# Patient Record
Sex: Male | Born: 1940 | Race: White | Hispanic: No | Marital: Married | Smoking: Former smoker
Health system: Southern US, Community
[De-identification: ages and names within clinical notes are randomized; demographics above are authoritative.]

## PROBLEM LIST (undated history)

## (undated) DIAGNOSIS — I739 Peripheral vascular disease, unspecified: Secondary | ICD-10-CM

## (undated) DIAGNOSIS — I10 Essential (primary) hypertension: Secondary | ICD-10-CM

## (undated) DIAGNOSIS — I255 Ischemic cardiomyopathy: Secondary | ICD-10-CM

## (undated) DIAGNOSIS — I509 Heart failure, unspecified: Secondary | ICD-10-CM

## (undated) DIAGNOSIS — K219 Gastro-esophageal reflux disease without esophagitis: Secondary | ICD-10-CM

## (undated) DIAGNOSIS — E785 Hyperlipidemia, unspecified: Secondary | ICD-10-CM

## (undated) DIAGNOSIS — I2510 Atherosclerotic heart disease of native coronary artery without angina pectoris: Secondary | ICD-10-CM

## (undated) DIAGNOSIS — H9190 Unspecified hearing loss, unspecified ear: Secondary | ICD-10-CM

## (undated) DIAGNOSIS — I219 Acute myocardial infarction, unspecified: Secondary | ICD-10-CM

## (undated) DIAGNOSIS — D696 Thrombocytopenia, unspecified: Secondary | ICD-10-CM

## (undated) HISTORY — DX: Unspecified hearing loss, unspecified ear: H91.90

## (undated) HISTORY — DX: Essential (primary) hypertension: I10

## (undated) HISTORY — PX: COLONOSCOPY: SHX174

## (undated) HISTORY — DX: Atherosclerotic heart disease of native coronary artery without angina pectoris: I25.10

## (undated) HISTORY — PX: HERNIA REPAIR: SHX51

## (undated) HISTORY — DX: Peripheral vascular disease, unspecified: I73.9

## (undated) HISTORY — DX: Ischemic cardiomyopathy: I25.5

## (undated) HISTORY — DX: Hyperlipidemia, unspecified: E78.5

## (undated) HISTORY — DX: Acute myocardial infarction, unspecified: I21.9

---

## 1992-09-23 ENCOUNTER — Encounter: Payer: Self-pay | Admitting: Cardiovascular Disease

## 1994-09-27 DIAGNOSIS — I219 Acute myocardial infarction, unspecified: Secondary | ICD-10-CM

## 1994-09-27 HISTORY — DX: Acute myocardial infarction, unspecified: I21.9

## 1994-09-27 HISTORY — PX: CORONARY ARTERY BYPASS GRAFT: SHX141

## 2008-05-29 ENCOUNTER — Ambulatory Visit: Payer: Self-pay | Admitting: Vascular Surgery

## 2009-10-16 DIAGNOSIS — I2510 Atherosclerotic heart disease of native coronary artery without angina pectoris: Secondary | ICD-10-CM | POA: Insufficient documentation

## 2009-10-16 DIAGNOSIS — E119 Type 2 diabetes mellitus without complications: Secondary | ICD-10-CM | POA: Insufficient documentation

## 2009-10-16 DIAGNOSIS — I10 Essential (primary) hypertension: Secondary | ICD-10-CM | POA: Insufficient documentation

## 2009-10-16 DIAGNOSIS — I749 Embolism and thrombosis of unspecified artery: Secondary | ICD-10-CM | POA: Insufficient documentation

## 2009-10-17 ENCOUNTER — Ambulatory Visit: Payer: Self-pay | Admitting: Cardiovascular Disease

## 2009-10-17 DIAGNOSIS — I739 Peripheral vascular disease, unspecified: Secondary | ICD-10-CM | POA: Insufficient documentation

## 2009-10-17 DIAGNOSIS — R011 Cardiac murmur, unspecified: Secondary | ICD-10-CM | POA: Insufficient documentation

## 2009-10-17 DIAGNOSIS — R0602 Shortness of breath: Secondary | ICD-10-CM | POA: Insufficient documentation

## 2009-11-03 ENCOUNTER — Telehealth (INDEPENDENT_AMBULATORY_CARE_PROVIDER_SITE_OTHER): Payer: Self-pay

## 2009-11-04 ENCOUNTER — Encounter: Payer: Self-pay | Admitting: Internal Medicine

## 2009-11-04 ENCOUNTER — Ambulatory Visit: Payer: Self-pay

## 2009-11-04 ENCOUNTER — Ambulatory Visit (HOSPITAL_COMMUNITY): Admission: RE | Admit: 2009-11-04 | Discharge: 2009-11-04 | Payer: Self-pay | Admitting: Internal Medicine

## 2009-11-04 ENCOUNTER — Encounter (HOSPITAL_COMMUNITY): Admission: RE | Admit: 2009-11-04 | Discharge: 2009-12-31 | Payer: Self-pay | Admitting: Cardiovascular Disease

## 2009-11-04 ENCOUNTER — Ambulatory Visit: Payer: Self-pay | Admitting: Cardiology

## 2009-11-14 ENCOUNTER — Ambulatory Visit: Payer: Self-pay | Admitting: Cardiovascular Disease

## 2009-12-01 ENCOUNTER — Ambulatory Visit: Payer: Self-pay | Admitting: Internal Medicine

## 2010-10-08 ENCOUNTER — Ambulatory Visit
Admission: RE | Admit: 2010-10-08 | Discharge: 2010-10-08 | Payer: Self-pay | Source: Home / Self Care | Attending: Vascular Surgery | Admitting: Vascular Surgery

## 2010-10-08 ENCOUNTER — Ambulatory Visit: Admit: 2010-10-08 | Payer: Self-pay | Admitting: Vascular Surgery

## 2010-10-28 NOTE — Assessment & Plan Note (Signed)
Summary: ok per jamie/to discuss icd placement/saf   Visit Type:  Initial Consult Primary Provider:  Dr. Laurene Footman   History of Present Illness: Troy [name] is referred today by Dr. Eden Emms for consideration for prophylactic ICD implant.  The patient is a pleasant 70 yo man with a h/o MI/PCI/CABG in 1994.  He has done quite well though he was lost to followup.  He has had a stress myoview which demonstrated a large antero scar and an EF34%. The patient also had a 2D echo which demonstrated an EF described as 35-40%.  He has class 1 CHF.  He exercises 4-5 days per week.  He has never had syncope and has had no hospitalizations for CHF. No peripheral edema.  Current Medications (verified): 1)  Simvastatin 20 Mg Tabs (Simvastatin) .... Take One Tablet By Mouth Daily At Bedtime 2)  Actoplus Met 15-850 Mg Tabs (Pioglitazone Hcl-Metformin Hcl) .... Take 1 Tablet By Mouth Two Times A Day 3)  Aspirin 81 Mg Tbec (Aspirin) .... Take One Tablet By Mouth Daily 4)  Oyster Shell Calcium 500 Mg Tabs (Oyster Shell) .... Take 1 Tablet By Mouth Once A Day 5)  Fish Oil 1000 Mg Caps (Omega-3 Fatty Acids) .... Take 1 Capsule By Mouth Two Times A Day 6)  Vitamin C 1000 Mg Tabs (Ascorbic Acid) .... Take 1 Tablet By Mouth Once A Day 7)  Glimepiride 2 Mg Tabs (Glimepiride) .... Take 1 Tablet By Mouth Once A Day 8)  Amlodipine Besy-Benazepril Hcl 10-20 Mg Caps (Amlodipine Besy-Benazepril Hcl) .... Take One Tablet By Mouth Once Daily.  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   Family History: Last updated: 10/16/2009 Unremarkable.      Social History: Last updated: 10/16/2009  He is married and has four children.  He is a retired   [employer].  Smoking history as above.  He does not consume alcohol  Review of Systems       All systems reviewed and negative except as noted in the HPI.  Vital Signs:  Patient profile:   70 year old male Height:      69 inches Weight:      222 pounds BMI:     32.90 O2 Sat:      96 % Pulse rate:   78 / minute BP sitting:   150 / 80  (left arm)  Vitals Entered By: Laurance Flatten CMA (December 01, 2009 4:20 PM)  Physical Exam  General:  Affect appropriate Healthy:  appears stated age HEENT: normal Neck supple with no adenopathy JVP normal no bruits no thyromegaly Lungs clear with no wheezing and good diaphragmatic motion Heart:  S1/S2 systolic  murmur,rub, gallop or click PMI enlarged Abdomen: benighn, BS positve, no tenderness, no AAA no bruit.  No HSM or HJR Distal pulses intact with no bruits No edema Neuro non-focal Skin warm and dry    Impression & Recommendations:  Problem # 1:  CAD (ICD-414.00) The patient has a borderline indication for ICD.  His EF is on the borderline and he really has class 1 symptoms.  I discussed the treatment options with the patient.  He is not inclined to proceed with ICD implant at this time.  He was unable  to tolerate an ARB. I will start him on low dose carvedilol. His updated medication list for this problem includes:    Aspirin 81 Mg Tbec (Aspirin) .Marland Kitchen... Take one tablet by mouth daily    Amlodipine Besy-benazepril Hcl 10-20 Mg Caps (Amlodipine besy-benazepril hcl) .Marland Kitchen... Take one tablet by mouth once daily.    Carvedilol 3.125 Mg Tabs (Carvedilol) .Marland Kitchen... Take one by mouth twice daily  Problem # 2:  HYPERTENSION (ICD-401.9) His blood pressure is elevated today and I would anticipate uptitrating his carvedilol as Dr. Eden Emms sees him in followup. A low sodium diet is recommended. He has self restarted his Amlodipine Benazapril. The following medications were removed from the medication  list:    Losartan Potassium 100 Mg Tabs (Losartan potassium) .Marland Kitchen... Take 1 tablet by mouth once a day His updated medication list for this problem includes:    Aspirin 81 Mg Tbec (Aspirin) .Marland Kitchen... Take one tablet by mouth daily    Amlodipine Besy-benazepril Hcl 10-20 Mg Caps (Amlodipine besy-benazepril hcl) .Marland Kitchen... Take one tablet by mouth once daily.    Carvedilol 3.125 Mg Tabs (Carvedilol) .Marland Kitchen... Take one by mouth twice daily  Patient Instructions: 1)  Your physician recommends that you schedule a follow-up appointment --keep apt with Dr Eden Emms as scheduled 2)  Your physician has recommended you make the following change in your medication: start Carvedilol 3.125mg  two times a day Prescriptions: CARVEDILOL 3.125 MG TABS (CARVEDILOL) take one by mouth twice daily  #60 x 6   Entered by:   Dennis Bast, RN, BSN   Authorized by:   Laren Boom, MD, [facility]   Signed by:   Dennis Bast, RN, BSN on 12/01/2009   Method used:   Electronically to        [facility]  [address] (retail)       [address]       [place], [place]  [address]       Ph: [phone]       Fax: [phone]   RxID:   [id number]

## 2010-10-28 NOTE — Assessment & Plan Note (Signed)
Summary: rov/discuss myoview/dm   Primary Provider:  Dr. Laurene Footman  CC:  follow up stress test.  History of Present Illness: Troy [name] is seen today for F/U of his echo and nucllear study.  He is a previous patient of Dr Riley Kill who was lost to F/U.  He had CABG about 15 years ago.  He is active with no SSCP but some dyspnea.  His myovue should a very large anteroapical MI with no ischemia and an EF of 34%.  I had a long discussion with him and his wife regarding this.  He needs to be on an ACE or ARB and stop his Ca blocker.  His wife had a bad cough with lisinopril so we will start a generic ARB.  He really doesnt want to be cathed and since he is active and essentially asymptomatic I dont think we need to rush into this.  He needs to be evaluated by EP for AICD.  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  He will have a BMET and BNP checked in a few weeks at Dr Lyndle Herrlich office.  We will see about getting him on coreg once we know he tolerates an ARB.    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 Take One Tablet By Mouth Daily At Bedtime 2)  Actoplus Met 15-850 Mg Tabs (Pioglitazone Hcl-Metformin Hcl) .... Take 1 Tablet By Mouth Two Times A Day 3)  Aspirin 81 Mg Tbec (Aspirin) .... Take One Tablet By Mouth Daily 4)  Oyster Shell Calcium 500 Mg Tabs (Oyster Shell) .... Take 1 Tablet By Mouth Once A Day 5)  Fish Oil 1000 Mg Caps (Omega-3 Fatty Acids) .... Take 1 Capsule By Mouth Two Times A Day 6)  Vitamin C 1000 Mg Tabs (Ascorbic Acid) .... Take 1 Tablet By Mouth Once A Day 7)  Glimepiride 2 Mg Tabs (Glimepiride) .... Take 1 Tablet By Mouth Once A Day 8)  Losartan  Potassium 100 Mg Tabs (Losartan Potassium) .... 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   Family History: Last updated: 10/16/2009 Unremarkable.      Social History: Last updated: 10/16/2009  He is married and has four children.  He is a retired   [employer].  Smoking history as above.  He does not consume alcohol  Review of Systems       Denies fever, malais, weight loss, blurry vision, decreased visual acuity, cough, sputum, , hemoptysis, pleuritic pain, palpitaitons, heartburn, abdominal pain, melena, lower extremity edema, claudication, or rash. All other systems reviewed and negative  Vital Signs:  Patient profile:   70 year old male Height:      69 inches Weight:      225 pounds BMI:     33.35 Pulse rate:   79 / minute Resp:     14 per minute BP sitting:   117 / 69  (  right arm)  Vitals Entered By: Kem Parkinson (November 14, 2009 1:41 PM)  Physical Exam  General:  Affect appropriate Healthy:  appears stated age HEENT: normal Neck supple with no adenopathy JVP normal no bruits no thyromegaly Lungs clear with no wheezing and good diaphragmatic motion Heart:  S1/S2 systolic  murmur,rub, gallop or click PMI enlarged Abdomen: benighn, BS positve, no tenderness, no AAA no bruit.  No HSM or HJR Distal pulses intact with no bruits No edema Neuro non-focal Skin warm and dry    Impression & Recommendations:  Problem # 1:  CAD (ICD-414.00) Ischemic DCM Functional class 2.  Start ARB.  F/U EPS to discuss AICD.  F/IU BMET and BNP in a few weeks and then decide on further changes to medical Rx.   D/C calcium blocker The following medications were removed from the medication list:    Amlodipine Besy-benazepril Hcl 10-20 Mg Caps (Amlodipine besy-benazepril hcl) .Marland Kitchen... Take 1 capsule by mouth once a day His updated medication list for this problem includes:    Aspirin 81 Mg Tbec (Aspirin) .Marland Kitchen... 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 Take 1 capsule by mouth once a day His updated medication list for this problem includes:    Losartan Potassium 100 Mg Tabs (Losartan potassium) .Marland Kitchen... Take 1 tablet by mouth once a day  Problem # 3:  HYPERTENSION (ICD-401.9) Add ARB and will see were he runs.  Low sodium diet The following medications were removed from the medication list:    Amlodipine Besy-benazepril Hcl 10-20 Mg Caps (Amlodipine besy-benazepril hcl) .Marland Kitchen... Take 1 capsule by mouth once a day His updated medication list for this problem includes:    Aspirin 81 Mg Tbec (Aspirin) .Marland Kitchen... Take one tablet by mouth daily    Losartan Potassium 100 Mg Tabs (Losartan potassium) .Marland Kitchen... Take 1 tablet by mouth once a day  Other Orders: EP Referral (Cardiology EP Ref )  Patient Instructions: 1)  Stop Amlodipine 2)  Start Losartan 100mg  daily 3)  Labs with primary care next week 4)  You have been referred to Dr Ladona Ridgel in 3-4 weeks 5)  Follow up in 3 months Prescriptions: LOSARTAN POTASSIUM 100 MG TABS (LOSARTAN POTASSIUM) Take 1 tablet by mouth once a day  #30 x 6   Entered by:   Meredith Staggers, RN   Authorized by:   Colon Branch, MD, [facility]   Signed by:   Meredith Staggers, RN on 11/14/2009   Method used:   Electronically to        [facility]  [address] (retail)       [address]       [place], [place]  [address]       Ph: [phone]       Fax:  [phone]   RxID:   [id number]

## 2010-10-28 NOTE — Assessment & Plan Note (Signed)
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 Hx. MI.  Symptoms: DOE    Nuclear Pre-Procedure Cardiac Risk Factors: Claudication, History of Smoking, Hypertension, Lipids, PVD Caffeine/Decaff Intake: None NPO After: 8:00 PM Lungs: clear IV 0.9% NS with Angio Cath: 20g     IV Site: (R) AC IV Started by: Irean Hong RN Chest Size (in) 44     Height (in): 69 Weight (lb): 221 BMI: 32.75 Tech Comments: Dr. Eden Emms consulted about this patient. The patient had an excessive amount of ventricular ectopy while walking the treadmill, cuplets, and triplets. The pictures are thought to have possible perinfarct ischemia, with and EFof 34%. Dr. Myrtis Ser will read the study later with the final conclusion. Dr.Nishan wanted the patient set up with him on Febraury 22, 2011. Also, a consult with S.Klein for possible Defibrilator implantation.  Nuclear Med Study 1 or 2 day study:  1 day     Stress Test Type:  Stress Reading MD:  Willa Rough, MD     Referring MD:  P.Nishan Resting Radionuclide:  Technetium 1m Tetrofosmin     Resting Radionuclide Dose:  11.0 mCi  Stress Radionuclide:  Technetium 102m Tetrofosmin     Stress Radionuclide Dose:  33.0 mCi   Stress Protocol Exercise Time (min):  6:46 min     Max HR:  142 bpm     Predicted Max HR:  152 bpm  Max Systolic BP: 182 mm Hg     Percent Max HR:  93.42 %     METS: 7.2 Rate Pressure Product:  45409    Stress Test Technologist:  Milana Na EMT-P     Nuclear Technologist:  Harlow Asa CNMT  Rest Procedure  Myocardial perfusion imaging was performed at rest 45 minutes following the intravenous administration of Myoview Technetium 5m Tetrofosmin.  Stress Procedure  The patient exercised for 6:46. The patient  stopped due to fatigue and denied any chest pain.  There were non specific ST-T wave changes, with freq pvcs; cuplets, and triplets.  Myoview was injected at peak exercise and myocardial perfusion imaging was performed after a brief delay.  QPS Raw Data Images:  Normal; no motion artifact; normal heart/lung ratio. Stress Images:  Marked decrease in activity in the anterior wall and apex Rest Images:  Same as stress Subtraction (SDS):  Slight ischemia Transient Ischemic Dilatation:  1.01  (Normal <1.22)  Lung/Heart Ratio:  .41  (Normal <0.45)  Quantitative Gated Spect Images QGS EDV:  223 ml QGS ESV:  148 ml QGS EF:  34 % QGS cine images:  Dyskinesis of the entire distal LV and apex.   Overall Impression  Exercise Capacity: Fair exercise capacity. BP Response: Normal blood pressure response. Clinical Symptoms: No chest pain ECG Impression: Ventricular ectopy Overall Impression: There is very large anterior and apical scar. There is mild peri-infarct ischemia.  Appended Document: Cardiology Nuclear Study SEE OFFICE NOTE

## 2010-10-28 NOTE — Assessment & Plan Note (Signed)
Summary: NP3/CAD   Visit Type:  Initial Consult  CC:  CAD.  History of Present Illness: Troy [name] is a previous patient of Dr Riley Kill in the early 90's.  He is referred by Dr Darrick Penna and [place] for cardiac F/U.  He had an MI in the mid 90's and subsequent CABG by [facility].  He has not really had F/U since then.  He sees Dr Darrick Penna for claudicatoin with ABI's only mildly decreased in the .8 range.  He has some exertional dyspnea.  I don't knwo hiis EF.  He complains of hip pain with ambulation which sounds more orthopedic than claudicant.  He is retired for the last 5 years but is raising his 45 year old grandson as his daughter who lives with him has drug and alcohol problems.  He denies SSCP, palpitations, edema or syncope.  He is compliant with his meds and Dr Evlyn Kanner follows his labs.    Cardiovascular Risk History:      Positive major cardiovascular risk factors include male age 67 years old or older, diabetes, and hypertension.        Positive history for target organ damage include ASHD (either angina; prior MI; prior CABG) and peripheral vascular disease.    Current Problems (verified): 1)  Dyspnea  (ICD-786.05) 2)  Arterial Occlusive Disease  (ICD-444.9) 3)  Cad  (ICD-414.00) 4)  Hypertension  (ICD-401.9) 5)  Dm  (ICD-250.00)  Current Medications (verified): 1)  Simvastatin 20 Mg Tabs (Simvastatin) .... Take One Tablet By Mouth Daily At Bedtime 2)  Amlodipine Besy-Benazepril Hcl 10-20 Mg Caps (Amlodipine Besy-Benazepril Hcl) .... Take 1 Capsule By Mouth Once A Day 3)  Actoplus Met 15-850 Mg Tabs (Pioglitazone Hcl-Metformin Hcl) .... Take 1 Tablet By Mouth Two Times A Day 4)  Aspirin 81 Mg Tbec (Aspirin) .... Take One Tablet By Mouth Daily 5)  Oyster Shell Calcium 500 Mg Tabs (Oyster Shell) .... Take 1 Tablet By Mouth Once A Day 6)  Fish Oil 1000 Mg Caps (Omega-3 Fatty Acids) .... Take 1 Capsule By Mouth Two Times A Day 7)  Vitamin C 1000 Mg Tabs (Ascorbic Acid) .... Take 1 Tablet By Mouth  Once A Day 8)  Glimepiride 2 Mg Tabs (Glimepiride) .... Take 1 Tablet By Mouth Once A Day  Allergies (verified): No Known Drug Allergies  Review of Systems       Denies fever, malais, weight loss, blurry vision, decreased visual acuity, cough, sputum, , hemoptysis, pleuritic pain, palpitaitons, heartburn, abdominal pain, melena, lower extremity edema, claudication, or rash.   Vital Signs:  Patient profile:   70 year old male Height:      69 inches Weight:      222.50 pounds BMI:     32.98 Pulse rate:   60 / minute Pulse rhythm:   irregular Resp:     18 per minute BP sitting:   130 / 74  (left arm) Cuff size:   large  Vitals Entered By: Vikki Ports (October 17, 2009 9:08 AM)  Physical Exam  General:  Affect appropriate Healthy:  appears stated age HEENT: normal Neck supple with no adenopathy JVP normal no bruits no thyromegaly Lungs clear with no wheezing and good diaphragmat 2SEM  murmur, no rub, gallop or click PMI normal Abdomen: benighn, BS positve, no tenderness, no AAA no bruit.  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  Also evaluate EF given abnormal ECG, previous MI and CABG His updated medication list for this problem includes:    Amlodipine Besy-benazepril Hcl 10-20 Mg Caps (Amlodipine besy-benazepril hcl) .Marland Kitchen... Take 1 capsule by mouth once a day  Problem # 2:  CAD (ICD-414.00) CABG over 15 years ago with no F/U.  Exertional dyspnea  Myovue His updated medication list for this problem includes:    Amlodipine Besy-benazepril Hcl 10-20 Mg Caps (Amlodipine besy-benazepril hcl) .Marland Kitchen... Take 1 capsule by mouth once a day    Aspirin 81 Mg Tbec (Aspirin) .Marland Kitchen... Take one tablet by mouth daily  Orders: EKG w/ Interpretation (93[address]) Nuclear Stress Test (Nuc Stress Test) Echocardiogram (Echo)  Problem # 3:   HYPERTENSION (ICD-401.9) Well controlled His updated medication list for this problem includes:    Amlodipine Besy-benazepril Hcl 10-20 Mg Caps (Amlodipine besy-benazepril hcl) .Marland Kitchen... Take 1 capsule by mouth once a day    Aspirin 81 Mg Tbec (Aspirin) .Marland Kitchen... Take one tablet by mouth daily  Orders: EKG w/ Interpretation ([address]) Nuclear Stress Test (Nuc Stress Test) Echocardiogram (Echo)  Problem # 4:  PVD (ICD-443.9) F/U Fields  Consider yearly ABI';s with duplex.  Walking limited more by orhopedic hip pain right greater than left at this time  Cardiovascular Risk Assessment/Plan:      The patient's hypertensive risk group is category C: Target organ damage and/or diabetes.  Today's blood pressure is 130/74.    Patient Instructions: 1)  Your physician wants you to follow-up in: 1 year.  You will receive a reminder letter in the mail two months in advance. If you don't receive a letter, please call our office to schedule the follow-up appointment. 2)  Your physician has requested that you have an echocardiogram.  Echocardiography is a painless test that uses sound waves to create images of your heart. It provides your doctor with information about the size and shape of your heart and how well your heart's chambers and valves are working.  This procedure takes approximately one hour. There are no restrictions for this procedure. 3)  Your physician has requested that you have an exercise stress myoview.  For further information please visit [email or web address]  Please follow instruction sheet, as given.     EKG Report  Procedure date:  10/17/2009  Findings:      NSR 60 PR 242 Previous anterior MI anterolateral biphasic T's Abnormal ECG   Appended Document: NP3/CAD F/U EPS to discuss AICD

## 2010-10-28 NOTE — Cardiovascular Report (Signed)
Summary: Heart Services Procedure Report  Heart Services Procedure Report   Imported By: Roderic Ovens 10/23/2009 11:38:36  _____________________________________________________________________  External Attachment:    Type:   Image     Comment:   External Document

## 2010-10-28 NOTE — Progress Notes (Signed)
Summary: Nuc. Pre-Procedure  Phone Note Outgoing Call Call back at [facility] Phone [phone]   Summary of Call: Left message with information on Myoview Information Sheet (see scanned document for details).      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 Hx. MI.  Symptoms: DOE    Nuclear Pre-Procedure Cardiac Risk Factors: Claudication, History of Smoking, Hypertension, Lipids, PVD Height (in): 69

## 2011-02-09 NOTE — Assessment & Plan Note (Signed)
OFFICE VISIT   Troy Troy [name], Troy Troy [name]  DOB:  05-13-1941                                       10/08/2010  EAVWU#:[id number]   CHIEF COMPLAINT:  Right leg pain.   HISTORY OF PRESENT ILLNESS:  The patient is a 70 year old male referred  by Dr. Evlyn Troy [name] for right lower extremity claudication symptoms.  The  patient was doing okay until recently when he took a trip to [employer] in [place] and noticed that after walking long distances he had  pain in his right calf.  The pain would resolve after resting  approximately 2 to 3 minutes.  It had a fairly consistent length.  He  states that the pain has been going on for about 1 mile.  He states the  pain would usually kick in after walking approximately 3 miles.  He  denies any symptoms in the left lower extremity.   CHRONIC MEDICAL PROBLEMS:  Include diabetes, hypertension and coronary  artery disease.  These are all currently stable and followed by Dr.  Evlyn Troy [name].  He has had diabetes for almost 20 years.  He is a former smoker  but quit 30 years ago.   SOCIAL HISTORY:  He is retired.  He is married.  Smoking history is as  listed above.  Does not consume alcohol regularly.   FAMILY HISTORY:  Unremarkable for vascular disease at age less than 65.   REVIEW OF SYSTEMS:  Full 12 point review of systems was performed with  the patient today.  GENERAL:  He is 5 feet 9 inches, 234 pounds.  VASCULAR:  As mentioned above.  All other systems were negative.   MEDICATIONS:  1. Simvastatin 40 mg half tablet once daily.  2. Aspirin 81 mg once a day.  3. Fish Oil once daily.  4. Vitamin C, vitamin Troy [name] once daily.  5. Glimepiride 2 mg 1 tablet daily.  6. Kombiglyze XR once daily.  7. Benazepril 20 mg once a day.  8. Amlodipine 10 mg once a day.   ALLERGIES:  He has no known drug allergies.   PHYSICAL EXAM:  Vital signs:  Blood pressure is 148/71 in the left arm,  oxygen saturation is 98% on room air, heart rate 74 and  regular.  HEENT:  Unremarkable.  Neck:  Has 2+ carotid pulses without bruit.  Chest:  Clear to auscultation.  Cardiac:  Regular rate and rhythm without  murmur.  Abdomen:  Obese, soft, nontender, nondistended.  No masses.  Extremities:  He has 2+ radial, femoral pulses bilaterally.  He has a 2+  left dorsalis pedis pulse.  He has absent pedal pulses in the right  foot.  Neurological:  Shows symmetric upper extremity and lower  extremity motor strength which is 5/5.  Musculoskeletal:  Shows no  obvious major joint deformities.  Skin:  Has no open ulcers or rashes.   He had bilateral ABIs performed today which were 0.9 on the right with  triphasic waveforms and 1.15 on the left with triphasic waveforms.  These were similar to the results of his ABIs performed in September of  2009.   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  He has overall fairly mild right lower  extremity claudication symptoms.  I discussed with him today continued  risk factor  modification with control of his blood pressure, cholesterol  and diabetes.  His symptoms are fairly mild in nature.  I do not believe  that he would benefit significantly from an intervention at this point.  He is fairly satisfied with his walking distance but mainly wanted to  check and make sure things had not deteriorated.  He will follow up with  me in 6 months' time for repeat ABIs or sooner if his symptoms worsen.  He will continue to take his aspirin daily.     Troy Hora. Fields, MD  Electronically Signed   CEF/MEDQ  Troy [name]:  10/08/2010  T:  10/09/2010  Job:  [id number]   cc:   Troy Troy [name], M.Troy [name].

## 2011-02-09 NOTE — Assessment & Plan Note (Signed)
OFFICE VISIT   Troy [name], Troy [name]  DOB:  March 12, 1941                                       05/29/2008  WUJWJ#:[id number]   The patient is a 70 year old male with atherosclerotic risk factors of  diabetes, hypertension, coronary artery disease and elevated  cholesterol.  He is also a former smoker but quit in 1996.  He is sent  today for evaluation by Dr. Evlyn Kanner for lower extremity arterial occlusive  disease.  He currently has some pain in his feet with walking.  However,  he does not experience this until he walks at least a mile.  He  exercises regularly and goes to the swimming pool a few times a week to  work out.  He states that this pain he gets in his feet has been present  for at least 2 years.  He does not complain of significant claudication  symptoms.   PAST SURGICAL HISTORY:  Remarkable for coronary artery bypass grafting  with harvesting of both saphenous veins.   MEDICATIONS:  1. Include glipizide 10 mg once a day.  2. Simvastatin 40 mg once a day.  3. Amlodipine benazepril 10/20 once a day.  4. Avandamet 10/998 twice a day.  5. Levemir insulin 22 units once a day.  6. Fish oil 1000 mg twice a day.  7. Aspirin 325 mg once a day.  8. Vitamin C 500 mg once a day.   ALLERGIES:  He has no known drug allergies.   PAST MEDICAL HISTORY:  Significant for myocardial infarction in 1996.   FAMILY HISTORY:  Unremarkable.   SOCIAL HISTORY:  He is married and has four children.  He is a retired  [employer].  Smoking history as above.  He does not consume alcohol.   REVIEW OF SYSTEMS:  He is 5 feet 9 inches and 238 pounds.  Cardiac,  pulmonary, GI, renal, vascular, neurologic, orthopedic, psychiatric,  ENT, hematologic review of systems are otherwise negative.   PHYSICAL EXAMINATION:  Vital signs:  Blood pressure is 144/77 in the  left arm, pulse is 71 and regular.  HEENT:  Is unremarkable.  Neck:  2+  carotid pulses without bruits.  Chest:  Clear  to auscultation.  Cardiac:  Exam is regular rate and rhythm without murmur.  Abdomen:  Is obese,  soft, nontender, nondistended.  No masses.  He has 2+ carotid, radial,  femoral pulses bilaterally.  He has 1+ popliteal pulses bilaterally.  He  has a 1+ right dorsalis pedis pulse, a 2+ left dorsalis pedis pulse and  2+ posterior tibial pulses bilaterally.   He had bilateral ABIs performed at our office today which were 0.87 on  the right side and 1.22 on the left side.  This is a mild decrease on  the right side.   In summary, the patient has mild arterial occlusive disease in his right  lower extremity.  This is currently essentially asymptomatic.  Lower  extremity arterial circulation on the left side is normal.   I reassured the patient that his risk of limb loss long-term is quite  low.  However, I do believe he warrants followup since he does have a  history of diabetes and could have progression of disease over time.  I  encouraged him to continue his exercise program and also to walk as much  as possible.  In addition to controlling his atherosclerotic risk  factors of elevated cholesterol, hypertension and diabetes.  He will  follow up with me with repeat ABIs in 1 year's time.   Janetta Hora. Fields, MD  Electronically Signed   CEF/MEDQ  D:  05/30/2008  T:  05/30/2008  Job:  [id number]   cc:   [name], M.D.

## 2011-02-12 ENCOUNTER — Encounter: Payer: Self-pay | Admitting: Vascular Surgery

## 2011-04-15 ENCOUNTER — Ambulatory Visit: Payer: Self-pay | Admitting: Vascular Surgery

## 2011-06-03 ENCOUNTER — Encounter: Payer: Self-pay | Admitting: Cardiovascular Disease

## 2011-06-08 ENCOUNTER — Encounter: Payer: Self-pay | Admitting: Cardiovascular Disease

## 2011-06-08 ENCOUNTER — Ambulatory Visit (INDEPENDENT_AMBULATORY_CARE_PROVIDER_SITE_OTHER): Payer: Medicare Other | Admitting: Cardiovascular Disease

## 2011-06-08 VITALS — BP 146/78 | HR 69 | Resp 14 | Wt 227.0 lb

## 2011-06-08 DIAGNOSIS — I2510 Atherosclerotic heart disease of native coronary artery without angina pectoris: Secondary | ICD-10-CM

## 2011-06-08 DIAGNOSIS — I2109 ST elevation (STEMI) myocardial infarction involving other coronary artery of anterior wall: Secondary | ICD-10-CM

## 2011-06-08 DIAGNOSIS — R011 Cardiac murmur, unspecified: Secondary | ICD-10-CM

## 2011-06-08 DIAGNOSIS — I10 Essential (primary) hypertension: Secondary | ICD-10-CM

## 2011-06-08 DIAGNOSIS — I739 Peripheral vascular disease, unspecified: Secondary | ICD-10-CM

## 2011-06-08 DIAGNOSIS — E119 Type 2 diabetes mellitus without complications: Secondary | ICD-10-CM

## 2011-06-08 NOTE — Assessment & Plan Note (Signed)
Murmur of mild AS or AV sclerosis  History of murmur. Check echo

## 2011-06-08 NOTE — Assessment & Plan Note (Signed)
Will likely end up on insulin again given central obesity.  Discussed low carb diet and target A1c 6.5 or less  F/U Dr Evlyn Kanner

## 2011-06-08 NOTE — Assessment & Plan Note (Signed)
Mild with palpable pulses and ABI's not that bad.  [facility] program F/U Dr Darrick Penna.  Ambulation more limited by right hip pain which is arthritic

## 2011-06-08 NOTE — Patient Instructions (Signed)
Your physician has requested that you have an echocardiogram. Echocardiography is a painless test that uses sound waves to create images of your heart. It provides your doctor with information about the size and shape of your heart and how well your heart's chambers and valves are working. This procedure takes approximately one hour. There are no restrictions for this procedure.   

## 2011-06-08 NOTE — Assessment & Plan Note (Signed)
Well controlled.  Continue current medications and low sodium Dash type diet.    

## 2011-06-08 NOTE — Progress Notes (Signed)
70 yo previously seen by Dr Riley Kill.  Referred by Dr Evlyn Kanner for evaluation of fatigue and CAD   History of CABG in 53 by [facility].  Myovue in 2010 with EF 34% large anterior scar.  Seen by Dr Ladona Ridgel who did not think AICD needed.  Patient has DM HTN and elevated lipids.  Oral hypoglycemics changed last fall and now off insulin. Does not check BS regularly due to cost. Last A1c about 7.0.  No SSCP.  Has fatigue. No PND or orthopnea.  Has significant R hip pain that prevents ambulation over long distance.  Just seen by Dr Darrick Penna for PVD.  Mild LLE disease with ABI on 1/12   He had bilateral ABIs performed today which were 0.9 on the right with   triphasic waveforms and 1.15 on the left with triphasic waveforms.   These were similar to the results of his ABIs performed in September of   2009.   Denies palpitations, edema or SSCP.  Has not had EF evaluated in 2 years.  ROS: Denies fever, malais, weight loss, blurry vision, decreased visual acuity, cough, sputum, SOB, hemoptysis, pleuritic pain, palpitaitons, heartburn, abdominal pain, melena, lower extremity edema, claudication, or rash.  All other systems reviewed and negative   General: Affect appropriate Healthy:  appears stated age obese HEENT: normal Neck supple with no adenopathy JVP normal no bruits no thyromegaly Lungs clear with no wheezing and good diaphragmatic motion Heart:  S1/S2 SEM murmur,rub, gallop or click PMI normal Abdomen: benighn, BS positve, no tenderness, no AAA no bruit.  No HSM or HJR Distal pulses intact with no bruits No edema Neuro non-focal Skin warm and dry No muscular weakness  Medications Current Outpatient Prescriptions  Medication Sig Dispense Refill  . AMLODIPINE BESYLATE PO Take 10 mg by mouth daily.        . Ascorbic Acid (VITAMIN C) 1000 MG tablet Take 1,000 mg by mouth daily.        Marland Kitchen aspirin 81 MG EC tablet Take 81 mg by mouth daily.        . Calcium Carbonate-Vitamin D (OYSTER SHELL CALCIUM 500  + D PO) Take 1 tablet by mouth daily.        . carvedilol (COREG) 3.125 MG tablet Take 3.125 mg by mouth 2 (two) times daily with a meal.        . fish oil-omega-3 fatty acids 1000 MG capsule Take 2 g by mouth daily.        Marland Kitchen glimepiride (AMARYL) 2 MG tablet Take 2 mg by mouth daily before breakfast.        . Saxagliptin-Metformin (KOMBIGLYZE XR) 01-999 MG TB24 Take by mouth.        . simvastatin (ZOCOR) 40 MG tablet Take 40 mg by mouth at bedtime.        Marland Kitchen zolpidem (AMBIEN) 10 MG tablet Take 1 tablet by mouth Daily.        Allergies Review of patient's allergies indicates no known allergies.  Family History: Family History  Problem Relation Age of Onset  . Heart disease Mother   . Heart disease Father     Social History: History   Social History  . Marital Status: Married    Spouse Name: N/A    Number of Children: 4  . Years of Education: N/A   Occupational History  . retired [employer] architect    Social History Main Topics  . Smoking status: Former Smoker    Quit date: 09/27/1978  .  Smokeless tobacco: Not on file  . Alcohol Use: No  . Drug Use: No  . Sexually Active:    Other Topics Concern  . Not on file   Social History Narrative  . No narrative on file    Electrocardiogram:  Assessment and Plan

## 2011-06-08 NOTE — Assessment & Plan Note (Signed)
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  Also may need to reconsider AICD Seen by Ladona Ridgel before

## 2011-06-08 NOTE — Assessment & Plan Note (Signed)
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  Continue medical Rx.  Nonischemic myovue 2010

## 2011-06-09 ENCOUNTER — Ambulatory Visit (HOSPITAL_COMMUNITY): Payer: Medicare Other | Attending: Cardiovascular Disease

## 2011-06-09 DIAGNOSIS — I2510 Atherosclerotic heart disease of native coronary artery without angina pectoris: Secondary | ICD-10-CM | POA: Insufficient documentation

## 2011-06-09 DIAGNOSIS — R011 Cardiac murmur, unspecified: Secondary | ICD-10-CM | POA: Insufficient documentation

## 2011-06-09 DIAGNOSIS — E119 Type 2 diabetes mellitus without complications: Secondary | ICD-10-CM | POA: Insufficient documentation

## 2011-06-09 DIAGNOSIS — I079 Rheumatic tricuspid valve disease, unspecified: Secondary | ICD-10-CM | POA: Insufficient documentation

## 2011-06-09 DIAGNOSIS — E785 Hyperlipidemia, unspecified: Secondary | ICD-10-CM | POA: Insufficient documentation

## 2011-06-09 DIAGNOSIS — I10 Essential (primary) hypertension: Secondary | ICD-10-CM | POA: Insufficient documentation

## 2011-06-09 DIAGNOSIS — I059 Rheumatic mitral valve disease, unspecified: Secondary | ICD-10-CM | POA: Insufficient documentation

## 2011-07-02 ENCOUNTER — Encounter: Payer: Self-pay | Admitting: Cardiovascular Disease

## 2012-06-19 ENCOUNTER — Encounter: Payer: Self-pay | Admitting: Cardiovascular Disease

## 2012-12-21 ENCOUNTER — Encounter: Payer: Self-pay | Admitting: Physician Assistant

## 2012-12-21 ENCOUNTER — Ambulatory Visit (INDEPENDENT_AMBULATORY_CARE_PROVIDER_SITE_OTHER): Payer: Medicare Other | Admitting: Physician Assistant

## 2012-12-21 ENCOUNTER — Telehealth: Payer: Self-pay | Admitting: Physician Assistant

## 2012-12-21 VITALS — BP 173/81 | Temp 98.1°F | Ht 69.0 in | Wt 220.8 lb

## 2012-12-21 DIAGNOSIS — M549 Dorsalgia, unspecified: Secondary | ICD-10-CM

## 2012-12-21 MED ORDER — CYCLOBENZAPRINE HCL 10 MG PO TABS: 10.0000 mg | ORAL_TABLET | Freq: Three times a day (TID) | ORAL | Status: DC | PRN

## 2012-12-21 NOTE — Telephone Encounter (Signed)
appt given for today 

## 2012-12-21 NOTE — Telephone Encounter (Signed)
In a lot of pain, back has knot on it. More on right side. You can feel a knot

## 2012-12-21 NOTE — Progress Notes (Signed)
  Subjective:    Patient ID: Troy [name], male    DOB: [birth year], 72 y.o.   MRN: [id number]  HPI Last night went to answer the phone and felt sudden pain    Review of Systems  Musculoskeletal: Positive for back pain.       Objective:   Physical Exam  Musculoskeletal:  Significant muscle spasm along right scapular region around right rib cage Shoulders full ROM C spine full ROM SLT neg          Assessment & Plan:  Back pain - Plan: cyclobenzaprine (FLEXERIL) 10 MG tablet  Advised moist heat 10 min on/ 45 min off

## 2013-06-27 ENCOUNTER — Encounter: Payer: Self-pay | Admitting: Internal Medicine

## 2013-07-31 ENCOUNTER — Encounter (HOSPITAL_COMMUNITY): Payer: Self-pay | Admitting: Pharmacy Technician

## 2013-07-31 NOTE — Patient Instructions (Addendum)
ADEMOLA VERT  07/31/2013   Your procedure is scheduled on:  08/06/13  Report to [facility] at 0800 AM.  Call this number if you have problems the morning of surgery: [phone]   Remember:   Do not eat food or drink liquids after midnight.   Take these medicines the morning of surgery with A SIP OF WATER: Lisinopril   Do not wear jewelry, make-up or nail polish.  Do not wear lotions, powders, or perfumes. You may wear deodorant.  Do not shave 48 hours prior to surgery. Men may shave face and neck.  Do not bring valuables to the hospital.  [facility] is not responsible for any belongings or valuables.               Contacts, dentures or bridgework may not be worn into surgery.  Leave suitcase in the car. After surgery it may be brought to your room.  For patients admitted to the hospital, discharge time is determined by your treatment team.               Patients discharged the day of surgery will not be allowed to drive home.  Name and phone number of your driver: family  Special Instructions: N/A  Please read over the following fact sheets that you were given: Pain Booklet, Anesthesia Post-op Instructions and Care and Recovery After Surgery   Cataract Surgery  A cataract is a clouding of the lens of the eye. When a lens becomes cloudy, vision is reduced based on the degree and nature of the clouding. Surgery may be needed to improve vision. Surgery removes the cloudy lens and usually replaces it with a substitute lens (intraocular lens, IOL). LET YOUR EYE DOCTOR KNOW ABOUT:  Allergies to food or medicine.  Medicines taken including herbs, eyedrops, over-the-counter medicines, and creams.  Use of steroids (by mouth or creams).  Previous problems with anesthetics or numbing medicine.  History of bleeding problems or blood clots.  Previous surgery.  Other health problems, including diabetes and kidney problems.  Possibility of pregnancy, if this applies. RISKS AND  COMPLICATIONS  Infection.  Inflammation of the eyeball (endophthalmitis) that can spread to both eyes (sympathetic ophthalmia).  Poor wound healing.  If an IOL is inserted, it can later fall out of proper position. This is very uncommon.  Clouding of the part of your eye that holds an IOL in place. This is called an "after-cataract." These are uncommon, but easily treated. BEFORE THE PROCEDURE  Do not eat or drink anything except small amounts of water for 8 to 12 before your surgery, or as directed by your caregiver.  Unless you are told otherwise, continue any eyedrops you have been prescribed.  Talk to your primary caregiver about all other medicines that you take (both prescription and non-prescription). In some cases, you may need to stop or change medicines near the time of your surgery. This is most important if you are taking blood-thinning medicine.Do not stop medicines unless you are told to do so.  Arrange for someone to drive you to and from the procedure.  Do not put contact lenses in either eye on the day of your surgery. PROCEDURE There is more than one method for safely removing a cataract. Your doctor can explain the differences and help determine which is best for you. Phacoemulsification surgery is the most common form of cataract surgery.  An injection is given behind the eye or eyedrops are given to make this a painless  procedure.  A small cut (incision) is made on the edge of the clear, dome-shaped surface that covers the front of the eye (cornea).  A tiny probe is painlessly inserted into the eye. This device gives off ultrasound waves that soften and break up the cloudy center of the lens. This makes it easier for the cloudy lens to be removed by suction.  An IOL may be implanted.  The normal lens of the eye is covered by a clear capsule. Part of that capsule is intentionally left in the eye to support the IOL.  Your surgeon may or may not use stitches to  close the incision. There are other forms of cataract surgery that require a larger incision and stiches to close the eye. This approach is taken in cases where the doctor feels that the cataract cannot be easily removed using phacoemulsification. AFTER THE PROCEDURE  When an IOL is implanted, it does not need care. It becomes a permanent part of your eye and cannot be seen or felt.  Your doctor will schedule follow-up exams to check on your progress.  Review your other medicines with your doctor to see which can be resumed after surgery.  Use eyedrops or take medicine as prescribed by your doctor. Document Released: 09/02/2011 Document Revised: 12/06/2011 Document Reviewed: 09/02/2011 [facility] Patient Information 2014 [place], [place].    PATIENT INSTRUCTIONS POST-ANESTHESIA  IMMEDIATELY FOLLOWING SURGERY:  Do not drive or operate machinery for the first twenty four hours after surgery.  Do not make any important decisions for twenty four hours after surgery or while taking narcotic pain medications or sedatives.  If you develop intractable nausea and vomiting or a severe headache please notify your doctor immediately.  FOLLOW-UP:  Please make an appointment with your surgeon as instructed. You do not need to follow up with anesthesia unless specifically instructed to do so.  WOUND CARE INSTRUCTIONS (if applicable):  Keep a dry clean dressing on the anesthesia/puncture wound site if there is drainage.  Once the wound has quit draining you may leave it open to air.  Generally you should leave the bandage intact for twenty four hours unless there is drainage.  If the epidural site drains for more than 36-48 hours please call the anesthesia department.  QUESTIONS?:  Please feel free to call your physician or the hospital operator if you have any questions, and they will be happy to assist you.

## 2013-08-01 ENCOUNTER — Encounter (HOSPITAL_COMMUNITY)
Admission: RE | Admit: 2013-08-01 | Discharge: 2013-08-01 | Disposition: A | Discharge status: Home / Self Care | Payer: Medicare Other | Source: Ambulatory Visit | Attending: Ophthalmology | Admitting: Ophthalmology

## 2013-08-01 ENCOUNTER — Encounter (HOSPITAL_COMMUNITY): Payer: Self-pay

## 2013-08-01 ENCOUNTER — Other Ambulatory Visit: Payer: Self-pay

## 2013-08-01 DIAGNOSIS — I440 Atrioventricular block, first degree: Secondary | ICD-10-CM | POA: Insufficient documentation

## 2013-08-01 DIAGNOSIS — Z01818 Encounter for other preprocedural examination: Secondary | ICD-10-CM | POA: Insufficient documentation

## 2013-08-01 DIAGNOSIS — I10 Essential (primary) hypertension: Secondary | ICD-10-CM | POA: Insufficient documentation

## 2013-08-01 DIAGNOSIS — Z01810 Encounter for preprocedural cardiovascular examination: Secondary | ICD-10-CM | POA: Insufficient documentation

## 2013-08-01 DIAGNOSIS — R9431 Abnormal electrocardiogram [ECG] [EKG]: Secondary | ICD-10-CM | POA: Insufficient documentation

## 2013-08-01 DIAGNOSIS — Z01812 Encounter for preprocedural laboratory examination: Secondary | ICD-10-CM | POA: Insufficient documentation

## 2013-08-01 HISTORY — DX: Gastro-esophageal reflux disease without esophagitis: K21.9

## 2013-08-01 LAB — BASIC METABOLIC PANEL
BUN: 13 mg/dL (ref 6–23)
CO2: 27 mEq/L (ref 19–32)
Calcium: 9.2 mg/dL (ref 8.4–10.5)
Chloride: 103 mEq/L (ref 96–112)
Creatinine, Ser: 0.74 mg/dL (ref 0.50–1.35)
GFR calc Af Amer: >90 mL/min (ref >90)
GFR calc non Af Amer: >90 mL/min (ref >90)
Glucose, Bld: 314 mg/dL — ABNORMAL HIGH (ref 70–99)
Potassium: 3.7 mEq/L (ref 3.5–5.1)
Sodium: 138 mEq/L (ref 135–145)

## 2013-08-01 LAB — HEMOGLOBIN AND HEMATOCRIT, BLOOD
HCT: 37 % — ABNORMAL LOW (ref 39.0–52.0)
Hemoglobin: 12.9 g/dL — ABNORMAL LOW (ref 13.0–17.0)

## 2013-08-03 MED ORDER — LIDOCAINE HCL (PF) 1 % IJ SOLN
INTRAMUSCULAR | Status: AC
  Filled 2013-08-03: qty 2

## 2013-08-03 MED ORDER — NEOMYCIN-POLYMYXIN-DEXAMETH 3.5-10000-0.1 OP SUSP
OPHTHALMIC | Status: AC
  Filled 2013-08-03: qty 5

## 2013-08-03 MED ORDER — PHENYLEPHRINE HCL 2.5 % OP SOLN
OPHTHALMIC | Status: AC
  Filled 2013-08-03: qty 15

## 2013-08-03 MED ORDER — CYCLOPENTOLATE-PHENYLEPHRINE OP SOLN OPTIME - NO CHARGE
OPHTHALMIC | Status: AC
  Filled 2013-08-03: qty 2

## 2013-08-03 MED ORDER — TETRACAINE HCL 0.5 % OP SOLN
OPHTHALMIC | Status: AC
  Filled 2013-08-03: qty 2

## 2013-08-06 ENCOUNTER — Encounter (HOSPITAL_COMMUNITY): Payer: Self-pay

## 2013-08-06 ENCOUNTER — Ambulatory Visit (HOSPITAL_COMMUNITY): Payer: Medicare Other | Admitting: Anesthesiology

## 2013-08-06 ENCOUNTER — Ambulatory Visit (HOSPITAL_COMMUNITY)
Admission: RE | Admit: 2013-08-06 | Discharge: 2013-08-06 | Disposition: A | Discharge status: Home / Self Care | Payer: Medicare Other | Source: Ambulatory Visit | Attending: Ophthalmology | Admitting: Ophthalmology

## 2013-08-06 ENCOUNTER — Encounter (HOSPITAL_COMMUNITY)
Admission: RE | Disposition: A | Discharge status: Home / Self Care | Payer: Self-pay | Source: Ambulatory Visit | Attending: Ophthalmology

## 2013-08-06 ENCOUNTER — Encounter (HOSPITAL_COMMUNITY): Payer: Medicare Other | Admitting: Anesthesiology

## 2013-08-06 DIAGNOSIS — Z01812 Encounter for preprocedural laboratory examination: Secondary | ICD-10-CM | POA: Insufficient documentation

## 2013-08-06 DIAGNOSIS — Z794 Long term (current) use of insulin: Secondary | ICD-10-CM | POA: Insufficient documentation

## 2013-08-06 DIAGNOSIS — I10 Essential (primary) hypertension: Secondary | ICD-10-CM | POA: Insufficient documentation

## 2013-08-06 DIAGNOSIS — E119 Type 2 diabetes mellitus without complications: Secondary | ICD-10-CM | POA: Insufficient documentation

## 2013-08-06 DIAGNOSIS — Z951 Presence of aortocoronary bypass graft: Secondary | ICD-10-CM | POA: Insufficient documentation

## 2013-08-06 DIAGNOSIS — H2589 Other age-related cataract: Secondary | ICD-10-CM | POA: Insufficient documentation

## 2013-08-06 HISTORY — PX: CATARACT EXTRACTION W/PHACO: SHX586

## 2013-08-06 LAB — GLUCOSE, CAPILLARY: Glucose-Capillary: 146 mg/dL — ABNORMAL HIGH (ref 70–99)

## 2013-08-06 SURGERY — PHACOEMULSIFICATION, CATARACT, WITH IOL INSERTION
Anesthesia: Monitor Anesthesia Care | Site: Eye | Laterality: Right | Wound class: Clean

## 2013-08-06 MED ORDER — LIDOCAINE HCL (PF) 1 % IJ SOLN
INTRAMUSCULAR | Status: AC
  Filled 2013-08-06: qty 2

## 2013-08-06 MED ORDER — PHENYLEPHRINE HCL 2.5 % OP SOLN
1.0000 [drp] | OPHTHALMIC | Status: AC
  Administered 2013-08-06 (×3): 1 [drp] via OPHTHALMIC

## 2013-08-06 MED ORDER — PHENYLEPHRINE HCL 2.5 % OP SOLN
OPHTHALMIC | Status: AC
  Filled 2013-08-06: qty 15

## 2013-08-06 MED ORDER — EPINEPHRINE HCL 1 MG/ML IJ SOLN
INTRAOCULAR | Status: DC | PRN
  Administered 2013-08-06: 09:00:00

## 2013-08-06 MED ORDER — TETRACAINE HCL 0.5 % OP SOLN
OPHTHALMIC | Status: AC
  Filled 2013-08-06: qty 2

## 2013-08-06 MED ORDER — CYCLOPENTOLATE-PHENYLEPHRINE 0.2-1 % OP SOLN
1.0000 [drp] | OPHTHALMIC | Status: AC
  Administered 2013-08-06 (×3): 1 [drp] via OPHTHALMIC

## 2013-08-06 MED ORDER — POVIDONE-IODINE 5 % OP SOLN
OPHTHALMIC | Status: DC | PRN
  Administered 2013-08-06: 1 via OPHTHALMIC

## 2013-08-06 MED ORDER — PROVISC 10 MG/ML IO SOLN
INTRAOCULAR | Status: DC | PRN
  Administered 2013-08-06: 0.85 mL via INTRAOCULAR

## 2013-08-06 MED ORDER — BSS IO SOLN
INTRAOCULAR | Status: DC | PRN
  Administered 2013-08-06: 15 mL via INTRAOCULAR

## 2013-08-06 MED ORDER — FENTANYL CITRATE 0.05 MG/ML IJ SOLN
INTRAMUSCULAR | Status: AC
  Filled 2013-08-06: qty 2

## 2013-08-06 MED ORDER — LIDOCAINE HCL 3.5 % OP GEL
OPHTHALMIC | Status: AC
  Filled 2013-08-06: qty 1

## 2013-08-06 MED ORDER — TETRACAINE HCL 0.5 % OP SOLN
1.0000 [drp] | OPHTHALMIC | Status: AC
  Administered 2013-08-06 (×3): 1 [drp] via OPHTHALMIC

## 2013-08-06 MED ORDER — LIDOCAINE 3.5 % OP GEL OPTIME - NO CHARGE
OPHTHALMIC | Status: DC | PRN
  Administered 2013-08-06: 2 [drp] via OPHTHALMIC

## 2013-08-06 MED ORDER — MIDAZOLAM HCL 2 MG/2ML IJ SOLN
1.0000 mg | INTRAMUSCULAR | Status: DC | PRN
  Administered 2013-08-06 (×2): 2 mg via INTRAVENOUS
  Filled 2013-08-06: qty 2

## 2013-08-06 MED ORDER — LACTATED RINGERS IV SOLN
INTRAVENOUS | Status: DC
  Administered 2013-08-06: 09:00:00 via INTRAVENOUS

## 2013-08-06 MED ORDER — CYCLOPENTOLATE-PHENYLEPHRINE OP SOLN OPTIME - NO CHARGE
OPHTHALMIC | Status: AC
  Filled 2013-08-06: qty 2

## 2013-08-06 MED ORDER — NEOMYCIN-POLYMYXIN-DEXAMETH 3.5-10000-0.1 OP SUSP
OPHTHALMIC | Status: AC
  Filled 2013-08-06: qty 5

## 2013-08-06 MED ORDER — FENTANYL CITRATE 0.05 MG/ML IJ SOLN
25.0000 ug | INTRAMUSCULAR | Status: AC
  Administered 2013-08-06: 25 ug via INTRAVENOUS

## 2013-08-06 MED ORDER — LIDOCAINE HCL 3.5 % OP GEL
1.0000 "application " | Freq: Once | OPHTHALMIC | Status: DC
  Filled 2013-08-06: qty 1

## 2013-08-06 MED ORDER — EPINEPHRINE HCL 1 MG/ML IJ SOLN
INTRAMUSCULAR | Status: AC
  Filled 2013-08-06: qty 1

## 2013-08-06 MED ORDER — NEOMYCIN-POLYMYXIN-DEXAMETH 3.5-10000-0.1 OP SUSP
OPHTHALMIC | Status: DC | PRN
  Administered 2013-08-06: 2 [drp] via OPHTHALMIC

## 2013-08-06 MED ORDER — LIDOCAINE HCL (PF) 1 % IJ SOLN
INTRAMUSCULAR | Status: DC | PRN
  Administered 2013-08-06: .4 mL

## 2013-08-06 MED ORDER — MIDAZOLAM HCL 2 MG/2ML IJ SOLN
INTRAMUSCULAR | Status: AC
  Filled 2013-08-06: qty 2

## 2013-08-06 SURGICAL SUPPLY — 32 items
CAPSULAR TENSION RING-AMO (OPHTHALMIC RELATED) IMPLANT
CLOTH BEACON ORANGE TIMEOUT ST (SAFETY) ×2 IMPLANT
EYE SHIELD UNIVERSAL CLEAR (GAUZE/BANDAGES/DRESSINGS) ×2 IMPLANT
GLOVE BIO SURGEON STRL SZ 6.5 (GLOVE) IMPLANT
GLOVE BIOGEL PI IND STRL 6.5 (GLOVE) IMPLANT
GLOVE BIOGEL PI IND STRL 7.0 (GLOVE) ×2 IMPLANT
GLOVE BIOGEL PI IND STRL 7.5 (GLOVE) IMPLANT
GLOVE BIOGEL PI INDICATOR 6.5 (GLOVE)
GLOVE BIOGEL PI INDICATOR 7.0 (GLOVE) ×2
GLOVE BIOGEL PI INDICATOR 7.5 (GLOVE)
GLOVE ECLIPSE 6.5 STRL STRAW (GLOVE) IMPLANT
GLOVE ECLIPSE 7.0 STRL STRAW (GLOVE) IMPLANT
GLOVE ECLIPSE 7.5 STRL STRAW (GLOVE) IMPLANT
GLOVE EXAM NITRILE LRG STRL (GLOVE) IMPLANT
GLOVE EXAM NITRILE MD LF STRL (GLOVE) IMPLANT
GLOVE SKINSENSE NS SZ6.5 (GLOVE)
GLOVE SKINSENSE NS SZ7.0 (GLOVE)
GLOVE SKINSENSE STRL SZ6.5 (GLOVE) IMPLANT
GLOVE SKINSENSE STRL SZ7.0 (GLOVE) IMPLANT
KIT VITRECTOMY (OPHTHALMIC RELATED) IMPLANT
PAD ARMBOARD 7.5X6 YLW CONV (MISCELLANEOUS) ×2 IMPLANT
PROC W NO LENS (INTRAOCULAR LENS)
PROC W SPEC LENS (INTRAOCULAR LENS)
PROCESS W NO LENS (INTRAOCULAR LENS) IMPLANT
PROCESS W SPEC LENS (INTRAOCULAR LENS) IMPLANT
RING MALYGIN (MISCELLANEOUS) IMPLANT
SIGHTPATH CAT PROC W REG LENS (Ophthalmic Related) ×2 IMPLANT
SYR TB 1ML LL NO SAFETY (SYRINGE) ×2 IMPLANT
TAPE SURG TRANSPORE 1 IN (GAUZE/BANDAGES/DRESSINGS) ×1 IMPLANT
TAPE SURGICAL TRANSPORE 1 IN (GAUZE/BANDAGES/DRESSINGS) ×1
VISCOELASTIC ADDITIONAL (OPHTHALMIC RELATED) IMPLANT
WATER STERILE IRR 250ML POUR (IV SOLUTION) ×2 IMPLANT

## 2013-08-06 NOTE — Transfer of Care (Signed)
Immediate Anesthesia Transfer of Care Note  Patient: Troy [name]  Procedure(s) Performed: Procedure(s) with comments: CATARACT EXTRACTION PHACO AND INTRAOCULAR LENS PLACEMENT RIGHT EYE (Right) - CDE:  15.48  Patient Location: Short Stay  Anesthesia Type:MAC  Level of Consciousness: awake  Airway & Oxygen Therapy: Patient Spontanous Breathing  Post-op Assessment: Report given to PACU RN  Post vital signs: Reviewed  Complications: No apparent anesthesia complications

## 2013-08-06 NOTE — Anesthesia Postprocedure Evaluation (Signed)
  Anesthesia Post-op Note  Patient: Troy [name]  Procedure(s) Performed: Procedure(s) with comments: CATARACT EXTRACTION PHACO AND INTRAOCULAR LENS PLACEMENT RIGHT EYE (Right) - CDE:  15.48  Patient Location: Short Stay  Anesthesia Type:MAC  Level of Consciousness: awake, alert  and oriented  Airway and Oxygen Therapy: Patient Spontanous Breathing  Post-op Pain: none  Post-op Assessment: Post-op Vital signs reviewed, Patient's Cardiovascular Status Stable, Respiratory Function Stable, Patent Airway and No signs of Nausea or vomiting  Post-op Vital Signs: Reviewed and stable  Complications: No apparent anesthesia complications

## 2013-08-06 NOTE — Op Note (Signed)
Date of Admission: 08/06/2013  Date of Surgery: 08/06/2013  Pre-Op Dx: Cataract  Right  Eye  Post-Op Dx: Combined Cataract  Right  Eye,  Dx Code 366.19  Surgeon: Gemma Payor, M.D.  Assistants: None  Anesthesia: Topical with MAC  Indications: Painless, progressive loss of vision with compromise of daily activities.  Surgery: Cataract Extraction with Intraocular lens Implant Right Eye  Discription: The patient had dilating drops and viscous lidocaine placed into the right eye in the pre-op holding area. After transfer to the operating room, a time out was performed. The patient was then prepped and draped. Beginning with a 75 degree blade a paracentesis port was made at the surgeon's 2 o'clock position. The anterior chamber was then filled with 1% non-preserved lidocaine. This was followed by filling the anterior chamber with Provisc.  A 2.65mm keratome blade was used to make a clear corneal incision at the temporal limbus.  A bent cystatome needle was used to create a continuous tear capsulotomy. Hydrodissection was performed with balanced salt solution on a Fine canula. The lens nucleus was then removed using the phacoemulsification handpiece. Residual cortex was removed with the I&A handpiece. The anterior chamber and capsular bag were refilled with Provisc. A posterior chamber intraocular lens was placed into the capsular bag with it's injector. The implant was positioned with the Kuglan hook. The Provisc was then removed from the anterior chamber and capsular bag with the I&A handpiece. Stromal hydration of the main incision and paracentesis port was performed with BSS on a Fine canula. The wounds were tested for leak which was negative. The patient tolerated the procedure well. There were no operative complications. The patient was then transferred to the recovery room in stable condition.  Complications: None  Specimen: None  EBL: None  Prosthetic device: B&L enVista, MX60, power 19.0D,  SN [id number].

## 2013-08-06 NOTE — Anesthesia Preprocedure Evaluation (Signed)
Anesthesia Evaluation  Patient identified by MRN, date of birth, ID band Patient awake    Reviewed: Allergy & Precautions, H&P , NPO status , Patient's Chart, lab work & pertinent test results, reviewed documented beta blocker date and time   Airway Mallampati: I TM Distance: >3 FB     Dental  (+) Edentulous Upper and Edentulous Lower   Pulmonary shortness of breath and with exertion,  breath sounds clear to auscultation        Cardiovascular hypertension, + CAD, + Past MI, + CABG and + Peripheral Vascular Disease Rhythm:Regular Rate:Normal     Neuro/Psych    GI/Hepatic GERD-  Medicated,  Endo/Other  diabetes, Type 2, Oral Hypoglycemic Agents  Renal/GU      Musculoskeletal   Abdominal   Peds  Hematology   Anesthesia Other Findings   Reproductive/Obstetrics                           Anesthesia Physical Anesthesia Plan  ASA: III  Anesthesia Plan: MAC   Post-op Pain Management:    Induction: Intravenous  Airway Management Planned: Nasal Cannula  Additional Equipment:   Intra-op Plan:   Post-operative Plan:   Informed Consent: I have reviewed the patients History and Physical, chart, labs and discussed the procedure including the risks, benefits and alternatives for the proposed anesthesia with the patient or authorized representative who has indicated his/her understanding and acceptance.     Plan Discussed with:   Anesthesia Plan Comments:         Anesthesia Quick Evaluation

## 2013-08-06 NOTE — H&P (Signed)
I have reviewed the H&P, the patient was re-examined, and I have identified no interval changes in medical condition and plan of care since the history and physical of record  

## 2013-08-08 ENCOUNTER — Encounter (HOSPITAL_COMMUNITY): Payer: Self-pay | Admitting: Ophthalmology

## 2013-08-27 ENCOUNTER — Telehealth: Payer: Self-pay

## 2013-08-27 NOTE — Telephone Encounter (Signed)
Pt has EF of 35% (can not be done at [facility] per anesthesia).  Would you like him to be set up for an office visit or direct hospital case?

## 2013-08-28 ENCOUNTER — Ambulatory Visit (AMBULATORY_SURGERY_CENTER): Payer: Medicare Other

## 2013-08-28 VITALS — Ht 69.0 in | Wt 216.6 lb

## 2013-08-28 DIAGNOSIS — Z1211 Encounter for screening for malignant neoplasm of colon: Secondary | ICD-10-CM

## 2013-08-28 MED ORDER — MOVIPREP 100 G PO SOLR: ORAL | Status: DC

## 2013-08-28 NOTE — Telephone Encounter (Signed)
He should have a routine office visit with one of the advanced extenders for evaluation and subsequent scheduling if appropriate. Please arrange. Thanks

## 2013-08-28 NOTE — Telephone Encounter (Signed)
Spoke with pt and scheduled an office visit with Amy-Pa on 08/31/13 to evaluate pt prior to his colonoscopy on 09/11/13, per Dr Marina Goodell. Pt aware of appointment and understood.

## 2013-08-29 ENCOUNTER — Encounter: Payer: Self-pay | Admitting: Internal Medicine

## 2013-08-31 ENCOUNTER — Encounter: Payer: Self-pay | Admitting: Physician Assistant

## 2013-08-31 ENCOUNTER — Ambulatory Visit (INDEPENDENT_AMBULATORY_CARE_PROVIDER_SITE_OTHER): Payer: Medicare Other | Admitting: Physician Assistant

## 2013-08-31 VITALS — BP 154/76 | HR 80 | Ht 69.0 in | Wt 217.0 lb

## 2013-08-31 DIAGNOSIS — Z1211 Encounter for screening for malignant neoplasm of colon: Secondary | ICD-10-CM

## 2013-08-31 DIAGNOSIS — I2589 Other forms of chronic ischemic heart disease: Secondary | ICD-10-CM

## 2013-08-31 DIAGNOSIS — I255 Ischemic cardiomyopathy: Secondary | ICD-10-CM

## 2013-08-31 NOTE — Patient Instructions (Signed)
You have been scheduled for a colonoscopy with propofol. Please follow written instructions given to you at your visit today.  Please pick up your prep kit at the pharmacy within the next 1-3 days. If you use inhalers (even only as needed), please bring them with you on the day of your procedure. Your physician has requested that you go to www.startemmi.com and enter the access code given to you at your visit today. This web site gives a general overview about your procedure. However, you should still follow specific instructions given to you by our office regarding your preparation for the procedure. CC:  Adrian Prince MD

## 2013-08-31 NOTE — Progress Notes (Signed)
Agree with initial assessment and plans 

## 2013-08-31 NOTE — Progress Notes (Signed)
Subjective:    Patient ID: Troy [name], male    DOB: [birth year], 72 y.o.   MRN: [id number]  HPI  Troy [name] is a pleasant 72 year old Caucasian male referred by Dr. Evlyn Kanner to Dr. Marina Goodell for screening colonoscopy. He was noted at the time of pre-vist  that he had a decreased ejection fraction and was scheduled for office visit. Patient has history of coronary artery disease, he had an MI in 74 which is followed by CABG in 1996. He says he has been stable since and has not had any active cardiac issues. He also has hypertension ,hyperlipidemia, and diabetes. Patient was last seen by cardiology/Dr. Mission in 2012 and had an echo done at that time with an EF of 35% it was noted that this was stable since previous echo of 2010. He denies any problems with exertional dyspnea or chest pain. Patient reports that he did have a very remote colonoscopy done by someone in [place] about 12 years ago. He thinks this may have been Dr. Elnoria Howard and was told it was normal. Patient currently has no GI symptoms, specifically no complaints of abdominal pain changes in his bowel habits melena or hematochezia. His family history is negative for colon cancer    Review of Systems  Constitutional: Negative.   HENT: Negative.   Eyes: Negative.   Respiratory: Negative.   Gastrointestinal: Negative.   Endocrine: Negative.   Genitourinary: Negative.   Musculoskeletal: Negative.   Skin: Negative.   Allergic/Immunologic: Negative.   Neurological: Negative.   Hematological: Negative.   Psychiatric/Behavioral: Negative.    Outpatient Prescriptions Prior to Visit  Medication Sig Dispense Refill  . Ascorbic Acid (VITAMIN C) 1000 MG tablet Take 1,000 mg by mouth daily.        Marland Kitchen aspirin 81 MG EC tablet Take 81 mg by mouth daily.        . Calcium Carbonate-Vitamin D (OYSTER SHELL CALCIUM 500 + D PO) Take 1 tablet by mouth daily.        . fish oil-omega-3 fatty acids 1000 MG capsule Take 2 g by mouth daily.        Marland Kitchen  glimepiride (AMARYL) 2 MG tablet Take 2 mg by mouth daily before breakfast.        . lisinopril (PRINIVIL,ZESTRIL) 10 MG tablet Take 10 mg by mouth daily.      . Saxagliptin-Metformin (KOMBIGLYZE XR) 01-999 MG TB24 Take 1 tablet by mouth daily.       . simvastatin (ZOCOR) 20 MG tablet Take 20 mg by mouth daily.      Marland Kitchen zolpidem (AMBIEN) 10 MG tablet Take 1 tablet by mouth Daily.      Marland Kitchen MOVIPREP 100 G SOLR Moviprep as directed / no susbtitutions  1 kit  0   No facility-administered medications prior to visit.   No Known Allergies Patient Active Problem List   Diagnosis Date Noted  . Acute anterior wall MI 06/08/2011  . PVD 10/17/2009  . MURMUR 10/17/2009  . DYSPNEA 10/17/2009  . DM 10/16/2009  . HYPERTENSION 10/16/2009  . CAD 10/16/2009  . ARTERIAL OCCLUSIVE DISEASE 10/16/2009   History  Substance Use Topics  . Smoking status: Former Smoker -- 1.00 packs/day for 30 years    Types: Cigarettes    Quit date: 09/27/1990  . Smokeless tobacco: Never Used  . Alcohol Use: No  family history includes Cancer in his brother; Heart disease in his father and mother.     Objective:   Physical ExamWell-developed older  white male in no acute distress, accompanied by his wife. Blood pressure 154/75 pulse 80 height 5 foot 9 weight 217. HEENT; nontraumatic normocephalic EOMI PERRLA sclera anicteric, Supple; no JVD, Cardiovascular; regular rate and rhythm with S1-S2 has a soft systolic murmur pulmonary clear bilaterally, Abdomen; soft nontender nondistended bowel sounds are active there is no palpable mass or hepatosplenomegaly, Rectal; exam not done, Extremities;no clubbing cyanosis or edema skin warm and dry, Psych; mood and affect normal and appropriate        Assessment & Plan:  #2  72 year old white male for colon neoplasia screening. Patient is asymptomatic and of average risk. Remote colonoscopy 12 years ago reported as normal #2 coronary artery disease status post remote MI and CABG 1996,  clinically stable since #3 ischemic cardiomyopathy with EF of 35% last documented in 2012 #4 diabetes mellitus #5 hypertension  Plan; patient will be scheduled for colonoscopy with Dr. Marina Goodell at the hospital with propofol given borderline ejection fraction. Procedure was discussed in detail with the patient and his wife and they are agreeable to proceed Patient to sign consent to obtain records from prior colonoscopy.

## 2013-09-04 ENCOUNTER — Encounter: Payer: Self-pay | Admitting: Internal Medicine

## 2013-09-11 ENCOUNTER — Encounter: Payer: Medicare Other | Admitting: Internal Medicine

## 2013-10-01 ENCOUNTER — Encounter (HOSPITAL_COMMUNITY): Payer: Self-pay | Admitting: Pharmacy Technician

## 2013-10-04 ENCOUNTER — Encounter (HOSPITAL_COMMUNITY): Payer: Self-pay | Admitting: *Deleted

## 2013-10-22 ENCOUNTER — Encounter (HOSPITAL_COMMUNITY): Payer: Self-pay | Admitting: Anesthesiology

## 2013-10-22 ENCOUNTER — Ambulatory Visit (HOSPITAL_COMMUNITY): Payer: Medicare Other | Admitting: Anesthesiology

## 2013-10-22 ENCOUNTER — Encounter (HOSPITAL_COMMUNITY): Payer: Medicare Other | Admitting: Anesthesiology

## 2013-10-22 ENCOUNTER — Encounter (HOSPITAL_COMMUNITY)
Admission: RE | Disposition: A | Discharge status: Home / Self Care | Payer: Self-pay | Source: Ambulatory Visit | Attending: Internal Medicine

## 2013-10-22 ENCOUNTER — Ambulatory Visit (HOSPITAL_COMMUNITY)
Admission: RE | Admit: 2013-10-22 | Discharge: 2013-10-22 | Disposition: A | Discharge status: Home / Self Care | Payer: Medicare Other | Source: Ambulatory Visit | Attending: Internal Medicine | Admitting: Internal Medicine

## 2013-10-22 DIAGNOSIS — E119 Type 2 diabetes mellitus without complications: Secondary | ICD-10-CM | POA: Insufficient documentation

## 2013-10-22 DIAGNOSIS — I70219 Atherosclerosis of native arteries of extremities with intermittent claudication, unspecified extremity: Secondary | ICD-10-CM | POA: Insufficient documentation

## 2013-10-22 DIAGNOSIS — K5730 Diverticulosis of large intestine without perforation or abscess without bleeding: Secondary | ICD-10-CM | POA: Insufficient documentation

## 2013-10-22 DIAGNOSIS — I2510 Atherosclerotic heart disease of native coronary artery without angina pectoris: Secondary | ICD-10-CM | POA: Insufficient documentation

## 2013-10-22 DIAGNOSIS — I252 Old myocardial infarction: Secondary | ICD-10-CM | POA: Insufficient documentation

## 2013-10-22 DIAGNOSIS — D126 Benign neoplasm of colon, unspecified: Secondary | ICD-10-CM | POA: Insufficient documentation

## 2013-10-22 DIAGNOSIS — Z87891 Personal history of nicotine dependence: Secondary | ICD-10-CM | POA: Insufficient documentation

## 2013-10-22 DIAGNOSIS — Z1211 Encounter for screening for malignant neoplasm of colon: Secondary | ICD-10-CM

## 2013-10-22 DIAGNOSIS — I10 Essential (primary) hypertension: Secondary | ICD-10-CM | POA: Insufficient documentation

## 2013-10-22 DIAGNOSIS — K219 Gastro-esophageal reflux disease without esophagitis: Secondary | ICD-10-CM | POA: Insufficient documentation

## 2013-10-22 DIAGNOSIS — Z951 Presence of aortocoronary bypass graft: Secondary | ICD-10-CM | POA: Insufficient documentation

## 2013-10-22 HISTORY — PX: COLONOSCOPY WITH PROPOFOL: SHX5780

## 2013-10-22 LAB — GLUCOSE, CAPILLARY: Glucose-Capillary: 151 mg/dL — ABNORMAL HIGH (ref 70–99)

## 2013-10-22 SURGERY — COLONOSCOPY WITH PROPOFOL
Anesthesia: Monitor Anesthesia Care

## 2013-10-22 MED ORDER — SODIUM CHLORIDE 0.9 % IV SOLN
INTRAVENOUS | Status: DC
Start: 2013-10-22 — End: 2013-10-22

## 2013-10-22 MED ORDER — LACTATED RINGERS IV SOLN
INTRAVENOUS | Status: DC
Start: 2013-10-22 — End: 2013-10-22
  Administered 2013-10-22: 1000 mL via INTRAVENOUS

## 2013-10-22 MED ORDER — FENTANYL CITRATE 0.05 MG/ML IJ SOLN
INTRAMUSCULAR | Status: DC | PRN
  Administered 2013-10-22 (×2): 50 ug via INTRAVENOUS

## 2013-10-22 MED ORDER — PROPOFOL 10 MG/ML IV BOLUS
INTRAVENOUS | Status: AC
  Filled 2013-10-22: qty 20

## 2013-10-22 MED ORDER — PROPOFOL INFUSION 10 MG/ML OPTIME
INTRAVENOUS | Status: DC | PRN
  Administered 2013-10-22: 140 ug/kg/min via INTRAVENOUS

## 2013-10-22 MED ORDER — KETAMINE HCL 10 MG/ML IJ SOLN
INTRAMUSCULAR | Status: DC | PRN
  Administered 2013-10-22: 15 mg via INTRAVENOUS

## 2013-10-22 MED ORDER — KETAMINE HCL 10 MG/ML IJ SOLN
INTRAMUSCULAR | Status: AC
  Filled 2013-10-22: qty 1

## 2013-10-22 SURGICAL SUPPLY — 21 items

## 2013-10-22 NOTE — Transfer of Care (Signed)
Immediate Anesthesia Transfer of Care Note  Patient: Troy [name]  Procedure(s) Performed: Procedure(s): COLONOSCOPY WITH PROPOFOL (N/A)  Patient Location: PACU  Anesthesia Type:MAC  Level of Consciousness: awake, alert  and oriented  Airway & Oxygen Therapy: Patient Spontanous Breathing and Patient connected to face mask oxygen  Post-op Assessment: Report given to PACU RN and Post -op Vital signs reviewed and stable  Post vital signs: Reviewed and stable  Complications: No apparent anesthesia complications

## 2013-10-22 NOTE — Op Note (Signed)
[facility] [place], [id number]   COLONOSCOPY PROCEDURE REPORT  PATIENT: Troy [name], Troy [name]  MR#: [id number] BIRTHDATE: 1941/07/31 , 73  yrs. old GENDER: Male ENDOSCOPIST: Eustace Quail, MD REFERRED KZ:SWFUXNA Forde Dandy, M.D. PROCEDURE DATE:  10/22/2013 PROCEDURE:   Colonoscopy with snare polypectomy x1 First Screening Colonoscopy - Avg.  risk and is 50 yrs.  old or older - No.  Prior Negative Screening - Now for repeat screening. 10 or more years since last screening  History of Adenoma - Now for follow-up colonoscopy & has been > or = to 3 yrs.  N/A  Polyps Removed Today? Yes. ASA CLASS:   Class III INDICATIONS:average risk screening.   reports negative colonoscopy about 12 years ago elsewhere MEDICATIONS: MAC sedation, administered by CRNA  . See anesthesia report  DESCRIPTION OF PROCEDURE:   After the risks benefits and alternatives of the procedure were thoroughly explained, informed consent was obtained.  A digital rectal exam revealed no abnormalities of the rectum.   The Pentax Colonoscope [id number] endoscope was introduced through the anus and advanced to the cecum, which was identified by both the appendix and ileocecal valve. No adverse events experienced.   The quality of the prep was excellent, using MoviPrep  The instrument was then slowly withdrawn as the colon was fully examined.  COLON FINDINGS: A diminutive polyp was found in the ascending colon. A polypectomy was performed with a cold snare.  The resection was complete and the polyp tissue was completely retrieved.   Moderate diverticulosis was noted in the ascending colon.   The colon mucosa was otherwise normal.  Retroflexed views revealed no abnormalities. The time to cecum=4 minutes 0 seconds.  Withdrawal time=14 minutes 0 seconds.  The scope was withdrawn and the procedure completed. COMPLICATIONS: There were no complications.  ENDOSCOPIC IMPRESSION: 1.   Diminutive polyp  was found in the ascending colon; polypectomy was performed with a cold snare 2.   Moderate diverticulosis was noted in the ascending colon 3.   The colon mucosa was otherwise normal  RECOMMENDATIONS: 1. Return to the care of your primary provider.  GI follow up as needed   eSigned:  Eustace Quail, MD 10/22/2013 11:01 AM   cc: Reynold Bowen, MD and The Patient

## 2013-10-22 NOTE — H&P (Signed)
  HISTORY OF PRESENT ILLNESS:  Troy [name] is a 73 y.o. male with the below listed medical history who was evaluated in the office regarding screening colonoscopy. His chronic medical problems are stable. No complaints this morning. Tolerated prep. Now for screening colonoscopy. Last colonoscopy, elsewhere, about 12 years ago and reported as negative.  REVIEW OF SYSTEMS:  All non-GI ROS negative   Past Medical History  Diagnosis Date  . Atherosclerosis of native arteries of the extremities with intermittent claudication   . Diabetes mellitus   . Hypertension   . CAD (coronary artery disease)   . Myocardial infarction 1996  . Arterial occlusive disease   . GERD (gastroesophageal reflux disease)   . Hard of hearing     has hearing aids /BIL    Past Surgical History  Procedure Laterality Date  . Colonoscopy    . Coronary artery bypass graft  1996    5 vessels  . Cataract extraction w/phaco Right 08/06/2013    Procedure: CATARACT EXTRACTION PHACO AND INTRAOCULAR LENS PLACEMENT RIGHT EYE;  Surgeon: Tonny Branch, MD;  Location: [facility];  Service: Ophthalmology;  Laterality: Right;  CDE:  15.48  . Hernia repair      groin-25 yrs ago    Social History Troy [name]  reports that he quit smoking about 23 years ago. His smoking use included Cigarettes. He has a 30 pack-year smoking history. He has never used smokeless tobacco. He reports that he does not drink alcohol or use illicit drugs.  family history includes Cancer in his brother; Heart disease in his father and mother.  No Known Allergies     PHYSICAL EXAMINATION: Vital signs: BP 184/77  Temp(Src) 97 F (36.1 C) (Oral)  Resp 12  SpO2 97% General: Well-developed, well-nourished, no acute distress HEENT: Sclerae are anicteric, conjunctiva pink. Oral mucosa intact Lungs: Clear Heart: Regular Abdomen: soft, nontender, nondistended, no obvious ascites, no peritoneal signs, normal bowel sounds. No  organomegaly. Extremities: No edema Psychiatric: alert and oriented x3. Cooperative   ASSESSMENT:  #1. Screening colonoscopy. Appropriate candidate without contraindication.The nature of the procedure, as well as the risks, benefits, and alternatives were carefully and thoroughly reviewed with the patient. Ample time for discussion and questions allowed. The patient understood, was satisfied, and agreed to proceed. #2. Multiple general medical problems. Stable

## 2013-10-22 NOTE — Anesthesia Preprocedure Evaluation (Signed)
Anesthesia Evaluation  Patient identified by MRN, date of birth, ID band Patient awake    Reviewed: Allergy & Precautions, H&P , NPO status , Patient's Chart, lab work & pertinent test results  Airway Mallampati: II TM Distance: >3 FB Neck ROM: Full    Dental no notable dental hx.    Pulmonary shortness of breath, former smoker,  breath sounds clear to auscultation  Pulmonary exam normal       Cardiovascular Exercise Tolerance: Good hypertension, Pt. on medications + CAD, + Past MI and + Peripheral Vascular Disease + Valvular Problems/Murmurs Rhythm:Regular Rate:Normal  ECG: 08-01-13: reviewed.   Neuro/Psych negative neurological ROS  negative psych ROS   GI/Hepatic Neg liver ROS, GERD-  ,  Endo/Other  diabetes, Type 2, Oral Hypoglycemic Agents  Renal/GU negative Renal ROS  negative genitourinary   Musculoskeletal negative musculoskeletal ROS (+)   Abdominal   Peds negative pediatric ROS (+)  Hematology negative hematology ROS (+)   Anesthesia Other Findings   Reproductive/Obstetrics negative OB ROS                           Anesthesia Physical Anesthesia Plan  ASA: III  Anesthesia Plan: MAC   Post-op Pain Management:    Induction: Intravenous  Airway Management Planned:   Additional Equipment:   Intra-op Plan:   Post-operative Plan:   Informed Consent: I have reviewed the patients History and Physical, chart, labs and discussed the procedure including the risks, benefits and alternatives for the proposed anesthesia with the patient or authorized representative who has indicated his/her understanding and acceptance.   Dental advisory given  Plan Discussed with: CRNA  Anesthesia Plan Comments:         Anesthesia Quick Evaluation

## 2013-10-22 NOTE — Preoperative (Signed)
Beta Blockers   Reason not to administer Beta Blockers:Not Applicable 

## 2013-10-22 NOTE — Anesthesia Postprocedure Evaluation (Signed)
  Anesthesia Post-op Note  Patient: Troy [name]  Procedure(s) Performed: Procedure(s) (LRB): COLONOSCOPY WITH PROPOFOL (N/A)  Patient Location: PACU  Anesthesia Type: MAC  Level of Consciousness: awake and alert   Airway and Oxygen Therapy: Patient Spontanous Breathing  Post-op Pain: mild  Post-op Assessment: Post-op Vital signs reviewed, Patient's Cardiovascular Status Stable, Respiratory Function Stable, Patent Airway and No signs of Nausea or vomiting  Last Vitals:  Filed Vitals:   10/22/13 1120  BP: 144/73  Temp:   Resp: 13    Post-op Vital Signs: stable   Complications: No apparent anesthesia complications

## 2013-10-22 NOTE — Discharge Instructions (Addendum)
Monitored Anesthesia Care  °Monitored anesthesia care is an anesthesia service for a medical procedure. Anesthesia is the loss of the ability to feel pain. It is produced by medications called anesthetics. It may affect a small area of your body (local anesthesia), a large area of your body (regional anesthesia), or your entire body (general anesthesia). The need for monitored anesthesia care depends your procedure, your condition, and the potential need for regional or general anesthesia. It is often provided during procedures where:  °· General anesthesia may be needed if there are complications. This is because you need special care when you are under general anesthesia.   °· You will be under local or regional anesthesia. This is so that you are able to have higher levels of anesthesia if needed.   °· You will receive calming medications (sedatives). This is especially the case if sedatives are given to put you in a semi-conscious state of relaxation (deep sedation). This is because the amount of sedative needed to produce this state can be hard to predict. Too much of a sedative can produce general anesthesia. °Monitored anesthesia care is performed by one or more caregivers who have special training in all types of anesthesia. You will need to meet with these caregivers before your procedure. During this meeting, they will ask you about your medical history. They will also give you instructions to follow. (For example, you will need to stop eating and drinking before your procedure. You may also need to stop or change medications you are taking.) During your procedure, your caregivers will stay with you. They will:  °· Watch your condition. This includes watching you blood pressure, breathing, and level of pain.   °· Diagnose and treat problems that occur.   °· Give medications if they are needed. These may include calming medications (sedatives) and anesthetics.   °· Make sure you are comfortable.   °Having  monitored anesthesia care does not necessarily mean that you will be under anesthesia. It does mean that your caregivers will be able to manage anesthesia if you need it or if it occurs. It also means that you will be able to have a different type of anesthesia than you are having if you need it. When your procedure is complete, your caregivers will continue to watch your condition. They will make sure any medications wear off before you are allowed to go home.  °Document Released: 06/09/2005 Document Revised: 01/08/2013 Document Reviewed: 10/25/2012 °[employer]® Patient Information ©2014 [employer]. ° °

## 2013-10-23 ENCOUNTER — Encounter (HOSPITAL_COMMUNITY): Payer: Self-pay | Admitting: Internal Medicine

## 2013-10-23 ENCOUNTER — Encounter: Payer: Self-pay | Admitting: Internal Medicine

## 2014-07-29 ENCOUNTER — Encounter (INDEPENDENT_AMBULATORY_CARE_PROVIDER_SITE_OTHER): Payer: Self-pay

## 2014-07-29 ENCOUNTER — Ambulatory Visit (INDEPENDENT_AMBULATORY_CARE_PROVIDER_SITE_OTHER): Payer: Medicare Other | Admitting: Family Medicine

## 2014-07-29 VITALS — BP 153/80 | HR 78 | Temp 97.5°F | Ht 69.0 in | Wt 219.6 lb

## 2014-07-29 DIAGNOSIS — R300 Dysuria: Secondary | ICD-10-CM

## 2014-07-29 DIAGNOSIS — N390 Urinary tract infection, site not specified: Secondary | ICD-10-CM

## 2014-07-29 LAB — POCT URINALYSIS DIPSTICK

## 2014-07-29 LAB — POCT UA - MICROSCOPIC ONLY
Bacteria, U Microscopic: NEGATIVE
Casts, Ur, LPF, POC: NEGATIVE
Crystals, Ur, HPF, POC: NEGATIVE
Mucus, UA: NEGATIVE
Yeast, UA: NEGATIVE

## 2014-07-29 MED ORDER — CIPROFLOXACIN HCL 500 MG PO TABS: 500.0000 mg | ORAL_TABLET | Freq: Two times a day (BID) | ORAL | Status: DC

## 2014-07-29 NOTE — Progress Notes (Signed)
   Subjective:    Patient ID: Troy [name], male    DOB: November 12, 1940, 73 y.o.   MRN: [id number]  HPI C/o pain with urination and painful urination.  He is taking AZO otc and feels better.  Review of Systems  Constitutional: Negative for fever.  HENT: Negative for ear pain.   Eyes: Negative for discharge.  Respiratory: Negative for cough.   Cardiovascular: Negative for chest pain.  Gastrointestinal: Negative for abdominal distention.  Endocrine: Negative for polyuria.  Genitourinary: Negative for difficulty urinating.  Musculoskeletal: Negative for gait problem and neck pain.  Skin: Negative for color change and rash.  Neurological: Negative for speech difficulty and headaches.  Psychiatric/Behavioral: Negative for agitation.       Objective:    BP 153/80 mmHg  Pulse 78  Temp(Src) 97.5 F (36.4 C) (Oral)  Ht 5\' 9"  (1.753 m)  Wt 219 lb 9.6 oz (99.61 kg)  BMI 32.41 kg/m2 Physical Exam  Constitutional: He is oriented to person, place, and time. He appears well-developed and well-nourished.  HENT:  Head: Normocephalic and atraumatic.  Mouth/Throat: Oropharynx is clear and moist.  Eyes: Pupils are equal, round, and reactive to light.  Neck: Normal range of motion. Neck supple.  Cardiovascular: Normal rate and regular rhythm.   No murmur heard. Pulmonary/Chest: Effort normal and breath sounds normal.  Abdominal: Soft. Bowel sounds are normal. There is no tenderness.  Neurological: He is alert and oriented to person, place, and time.  Skin: Skin is warm and dry.  Psychiatric: He has a normal mood and affect.          Assessment & Plan:     ICD-9-CM ICD-10-CM   1. Dysuria 788.1 R30.0 POCT UA - Microscopic Only     POCT urinalysis dipstick     ciprofloxacin (CIPRO) 500 MG tablet     Urine culture  2. Urinary tract infection without hematuria, site unspecified 599.0 N39.0 ciprofloxacin (CIPRO) 500 MG tablet     Urine culture     Return if symptoms worsen or fail  to improve.  Lysbeth Penner FNP

## 2014-07-31 LAB — URINE CULTURE: Organism ID, Bacteria: NO GROWTH

## 2014-08-30 ENCOUNTER — Ambulatory Visit (INDEPENDENT_AMBULATORY_CARE_PROVIDER_SITE_OTHER): Payer: Medicare Other | Admitting: Family Medicine

## 2014-08-30 ENCOUNTER — Encounter: Payer: Self-pay | Admitting: Family Medicine

## 2014-08-30 VITALS — BP 199/87 | HR 73 | Temp 97.8°F | Ht 69.0 in | Wt 217.0 lb

## 2014-08-30 DIAGNOSIS — N390 Urinary tract infection, site not specified: Secondary | ICD-10-CM

## 2014-08-30 DIAGNOSIS — R300 Dysuria: Secondary | ICD-10-CM

## 2014-08-30 LAB — POCT UA - MICROSCOPIC ONLY
Casts, Ur, LPF, POC: NEGATIVE
Crystals, Ur, HPF, POC: NEGATIVE
Epithelial cells, urine per micros: NEGATIVE
Mucus, UA: NEGATIVE
WBC, Ur, HPF, POC: NEGATIVE
Yeast, UA: NEGATIVE

## 2014-08-30 LAB — POCT URINALYSIS DIPSTICK
Bilirubin, UA: NEGATIVE
Glucose, UA: 100
Ketones, UA: NEGATIVE
Leukocytes, UA: NEGATIVE
Protein, UA: NEGATIVE
Spec Grav, UA: 1.01
Urobilinogen, UA: NEGATIVE
pH, UA: 6.5

## 2014-08-30 MED ORDER — CIPROFLOXACIN HCL 500 MG PO TABS: 500.0000 mg | ORAL_TABLET | Freq: Two times a day (BID) | ORAL | Status: DC

## 2014-08-30 NOTE — Progress Notes (Signed)
   Subjective:    Patient ID: Troy [name], male    DOB: 1941-02-24, 73 y.o.   MRN: [id number]  HPI 73 year old gentleman with urinary frequency and dysuria for the past 2 days. He was treated here one month ago for the same setting. He has had these symptoms intermittently since he was placed on an SGL 2 inhibitor, but his diabetes and A1c's are improved    Review of Systems  Genitourinary: Positive for dysuria and frequency.       Objective:   Physical Exam  Cardiovascular: Normal rate.   Pulmonary/Chest: Effort normal.  Genitourinary:  No CVA or suprapubic tenderness Urinalysis is clear to the eye and dipstick shows no leukocytes or nitrite    BP 199/87 mmHg  Pulse 73  Temp(Src) 97.8 F (36.6 C) (Oral)  Ht 5\' 9"  (1.753 m)  Wt 217 lb (98.431 kg)  BMI 32.03 kg/m2      Assessment & Plan:  1. Dysuria Begin Cipro pending result of urine C&S - POCT urinalysis dipstick - POCT UA - Microscopic Only  2. Urinary tract infection without hematuria, site unspecified   [name] MD

## 2014-08-30 NOTE — Addendum Note (Signed)
Addended by: [name] on: 08/30/2014 02:38 PM   Modules accepted: Orders

## 2014-08-30 NOTE — Addendum Note (Signed)
Addended by: [name] on: 08/30/2014 02:28 PM   Modules accepted: Orders

## 2014-09-01 ENCOUNTER — Encounter (HOSPITAL_COMMUNITY): Payer: Self-pay | Admitting: Emergency Medicine

## 2014-09-01 ENCOUNTER — Emergency Department (HOSPITAL_COMMUNITY)
Admission: EM | Admit: 2014-09-01 | Discharge: 2014-09-02 | Disposition: A | Discharge status: Home / Self Care | Payer: Medicare Other | Attending: Emergency Medicine | Admitting: Emergency Medicine

## 2014-09-01 DIAGNOSIS — Z87891 Personal history of nicotine dependence: Secondary | ICD-10-CM | POA: Insufficient documentation

## 2014-09-01 DIAGNOSIS — Z79899 Other long term (current) drug therapy: Secondary | ICD-10-CM | POA: Insufficient documentation

## 2014-09-01 DIAGNOSIS — Z951 Presence of aortocoronary bypass graft: Secondary | ICD-10-CM | POA: Insufficient documentation

## 2014-09-01 DIAGNOSIS — R112 Nausea with vomiting, unspecified: Secondary | ICD-10-CM

## 2014-09-01 DIAGNOSIS — Z792 Long term (current) use of antibiotics: Secondary | ICD-10-CM | POA: Insufficient documentation

## 2014-09-01 DIAGNOSIS — H9193 Unspecified hearing loss, bilateral: Secondary | ICD-10-CM | POA: Diagnosis not present

## 2014-09-01 DIAGNOSIS — I2510 Atherosclerotic heart disease of native coronary artery without angina pectoris: Secondary | ICD-10-CM | POA: Diagnosis not present

## 2014-09-01 DIAGNOSIS — Z7982 Long term (current) use of aspirin: Secondary | ICD-10-CM | POA: Insufficient documentation

## 2014-09-01 DIAGNOSIS — E119 Type 2 diabetes mellitus without complications: Secondary | ICD-10-CM | POA: Insufficient documentation

## 2014-09-01 DIAGNOSIS — I10 Essential (primary) hypertension: Secondary | ICD-10-CM | POA: Diagnosis not present

## 2014-09-01 DIAGNOSIS — I252 Old myocardial infarction: Secondary | ICD-10-CM | POA: Insufficient documentation

## 2014-09-01 DIAGNOSIS — Z8719 Personal history of other diseases of the digestive system: Secondary | ICD-10-CM | POA: Diagnosis not present

## 2014-09-01 LAB — URINALYSIS, ROUTINE W REFLEX MICROSCOPIC
Bilirubin Urine: NEGATIVE
Glucose, UA: 500 mg/dL — AB
Ketones, ur: 40 mg/dL — AB
Leukocytes, UA: NEGATIVE
Nitrite: NEGATIVE
Protein, ur: 30 mg/dL — AB
Specific Gravity, Urine: 1.015 (ref 1.005–1.030)
Urobilinogen, UA: 0.2 mg/dL (ref 0.0–1.0)
pH: 7 (ref 5.0–8.0)

## 2014-09-01 LAB — URINE CULTURE: Organism ID, Bacteria: NO GROWTH

## 2014-09-01 LAB — URINE MICROSCOPIC-ADD ON

## 2014-09-01 MED ORDER — SODIUM CHLORIDE 0.9 % IV BOLUS (SEPSIS)
1000.0000 mL | Freq: Once | INTRAVENOUS | Status: AC
  Administered 2014-09-01: 1000 mL via INTRAVENOUS

## 2014-09-01 MED ORDER — PROMETHAZINE HCL 25 MG/ML IJ SOLN
12.5000 mg | Freq: Once | INTRAMUSCULAR | Status: AC
  Administered 2014-09-01: 12.5 mg via INTRAVENOUS
  Filled 2014-09-01: qty 1

## 2014-09-01 NOTE — ED Notes (Signed)
Per EMS: pt being tx for UTI; started having pain again and saw PCP, and was placed on additional medication. Pt took both pills tonight, ate and then became sick to stomach roughly 3 hours ago. Pt was given 4mg  of Zofran IV en route.

## 2014-09-01 NOTE — ED Provider Notes (Signed)
CSN: [id number]     Arrival date & time 09/01/14  2309 History  This chart was scribed for Wynetta Fines, MD by Edison Simon, ED Scribe. This patient was seen in room APA14/APA14 and the patient's care was started at 11:16 PM.    Chief Complaint  Patient presents with  . Nausea   The history is provided by the patient and the EMS personnel. No language interpreter was used.    HPI Comments: Troy [name] is a 73 y.o. male who presents to the Emergency Department complaining of nausea and vomiting onset about 1800 today after eating. He states he has vomited at least 6 times and had dry heaves en route here. He was given IV Zofran by EMS prior to arrival with partial relief of his nausea. He states he is having some central abdominal pain which he attributes to forceful vomiting.   He was treated with ciprofloxacin about a month ago for a urinary tract infection. His symptoms, burning with urination, returned 3 days ago. He was placed on ciprofloxacin on the fourth of this month and Bactrim was added on the fifth of this month. He took his first Bactrim tablet this evening.   He also complains of having some cramping in his right leg, and dry mouth. He denies diarrhea, fever, chills.  Past Medical History  Diagnosis Date  . Atherosclerosis of native arteries of the extremities with intermittent claudication   . Diabetes mellitus   . Hypertension   . CAD (coronary artery disease)   . Myocardial infarction 1996  . Arterial occlusive disease   . GERD (gastroesophageal reflux disease)   . Hard of hearing     has hearing aids /BIL   Past Surgical History  Procedure Laterality Date  . Colonoscopy    . Coronary artery bypass graft  1996    5 vessels  . Cataract extraction w/phaco Right 08/06/2013    Procedure: CATARACT EXTRACTION PHACO AND INTRAOCULAR LENS PLACEMENT RIGHT EYE;  Surgeon: Tonny Branch, MD;  Location: [facility];  Service: Ophthalmology;  Laterality: Right;  CDE:  15.48  . Hernia  repair      groin-25 yrs ago  . Colonoscopy with propofol N/A 10/22/2013    Procedure: COLONOSCOPY WITH PROPOFOL;  Surgeon: Irene Shipper, MD;  Location: [facility];  Service: Endoscopy;  Laterality: N/A;   Family History  Problem Relation Age of Onset  . Heart disease Mother   . Heart disease Father   . Cancer Brother    History  Substance Use Topics  . Smoking status: Former Smoker -- 1.00 packs/day for 30 years    Types: Cigarettes    Quit date: 09/27/1990  . Smokeless tobacco: Never Used  . Alcohol Use: No    Review of Systems A complete 10 system review of systems was obtained and all systems are negative except as noted in the HPI and PMH.    Allergies  Review of patient's allergies indicates no known allergies.  Home Medications   Prior to Admission medications   Medication Sig Start Date End Date Taking? Authorizing Provider  Ascorbic Acid (VITAMIN C) 1000 MG tablet Take 1,000 mg by mouth daily.      Historical Provider, MD  aspirin 81 MG EC tablet Take 81 mg by mouth daily.      Historical Provider, MD  Calcium Carbonate-Vitamin D (OYSTER SHELL CALCIUM 500 + D PO) Take 1 tablet by mouth daily.      Historical Provider, MD  ciprofloxacin (CIPRO) 500 MG tablet Take 1 tablet (500 mg total) by mouth 2 (two) times daily. 08/30/14   [name], MD  fish oil-omega-3 fatty acids 1000 MG capsule Take 2 g by mouth daily.      Historical Provider, MD  glimepiride (AMARYL) 2 MG tablet Take 2 mg by mouth daily before breakfast.      Historical Provider, MD  Saxagliptin-Metformin (KOMBIGLYZE XR) 01-999 MG TB24 Take 1 tablet by mouth daily.     Historical Provider, MD  simvastatin (ZOCOR) 20 MG tablet Take 20 mg by mouth every evening.     Historical Provider, MD  valsartan (DIOVAN) 80 MG tablet Take 80 mg by mouth daily.    Historical Provider, MD  zolpidem (AMBIEN) 10 MG tablet Take 1 tablet by mouth Daily. 06/01/11   Historical Provider, MD   BP 159/62 mmHg  Pulse 78   Temp(Src) 98.2 F (36.8 C) (Oral)  Resp 18  Ht 5\' 8"  (1.727 m)  Wt 217 lb (98.431 kg)  BMI 33.00 kg/m2  SpO2 96%   Physical Exam  Nursing note and vitals reviewed.  General: Well-developed, well-nourished male in no acute distress; appearance consistent with age of record HENT: normocephalic; atraumatic; edentulous, dry mucous membranes Eyes: pupils equal, round and reactive to light; extraocular muscles intact Neck: supple Heart: regular rate and rhythm; no murmurs, rubs or gallops Lungs: clear to auscultation bilaterally Abdomen: soft; nondistended; no masses or hepatosplenomegaly; bowel sounds present, mid abdominal tenderness GU: Equivocal prostate tenderness Extremities: No deformity; full range of motion; pulses normal Neurologic: Awake, alert and oriented; motor function intact in all extremities and symmetric; no facial droop Skin: Warm and dry Psychiatric: Normal mood and affect   ED Course  Procedures (including critical care time)  DIAGNOSTIC STUDIES: Oxygen Saturation is 96% on room air, normal by my interpretation.    COORDINATION OF CARE: 11:27 PM Discussed treatment plan with patient at beside, the patient agrees with the plan and has no further questions at this time.   MDM   Nursing notes and vitals signs, including pulse oximetry, reviewed.  Summary of this visit's results, reviewed by myself:  Labs:  Results for orders placed or performed during the hospital encounter of 09/01/14 (from the past 24 hour(s))  Urinalysis, Routine w reflex microscopic     Status: Abnormal   Collection Time: 09/01/14 11:40 PM  Result Value Ref Range   Color, Urine YELLOW YELLOW   APPearance CLEAR CLEAR   Specific Gravity, Urine 1.015 1.005 - 1.030   pH 7.0 5.0 - 8.0   Glucose, UA 500 (A) NEGATIVE mg/dL   Hgb urine dipstick TRACE (A) NEGATIVE   Bilirubin Urine NEGATIVE NEGATIVE   Ketones, ur 40 (A) NEGATIVE mg/dL   Protein, ur 30 (A) NEGATIVE mg/dL   Urobilinogen,  UA 0.2 0.0 - 1.0 mg/dL   Nitrite NEGATIVE NEGATIVE   Leukocytes, UA NEGATIVE NEGATIVE  Urine microscopic-add on     Status: None   Collection Time: 09/01/14 11:40 PM  Result Value Ref Range   WBC, UA 0-2 <3 WBC/hpf   RBC / HPF 0-2 <3 RBC/hpf   2:10 AM Patient drinking fluids without emesis; once to go home. He was advised to take his antibiotics on a full stomach. He was advised that his urinalysis was not consistent with a urinary tract infection but this may represent improvement as a result of antibiotic therapy.  I personally performed the services described in this documentation, which was scribed in my  presence. The recorded information has been reviewed and is accurate.   Wynetta Fines, MD 09/02/14 [phone]

## 2014-09-02 LAB — CBC WITH DIFFERENTIAL/PLATELET
Basophils Absolute: 0 10*3/uL (ref 0.0–0.1)
Basophils Relative: 0 % (ref 0–1)
Eosinophils Absolute: 0 10*3/uL (ref 0.0–0.7)
Eosinophils Relative: 0 % (ref 0–5)
HCT: 39.4 % (ref 39.0–52.0)
Hemoglobin: 13.6 g/dL (ref 13.0–17.0)
Lymphocytes Relative: 6 % — ABNORMAL LOW (ref 12–46)
Lymphs Abs: 0.4 10*3/uL — ABNORMAL LOW (ref 0.7–4.0)
MCH: 30 pg (ref 26.0–34.0)
MCHC: 34.5 g/dL (ref 30.0–36.0)
MCV: 87 fL (ref 78.0–100.0)
Monocytes Absolute: 0.2 10*3/uL (ref 0.1–1.0)
Monocytes Relative: 3 % (ref 3–12)
Neutro Abs: 5.8 10*3/uL (ref 1.7–7.7)
Neutrophils Relative %: 91 % — ABNORMAL HIGH (ref 43–77)
Platelets: ADEQUATE 10*3/uL (ref 150–400)
RBC: 4.53 MIL/uL (ref 4.22–5.81)
RDW: 13.2 % (ref 11.5–15.5)
Smear Review: ADEQUATE
WBC: 6.4 10*3/uL (ref 4.0–10.5)

## 2014-09-02 LAB — BASIC METABOLIC PANEL
Anion gap: 14 (ref 5–15)
BUN: 12 mg/dL (ref 6–23)
CO2: 25 mEq/L (ref 19–32)
Calcium: 9.1 mg/dL (ref 8.4–10.5)
Chloride: 100 mEq/L (ref 96–112)
Creatinine, Ser: 0.88 mg/dL (ref 0.50–1.35)
GFR calc Af Amer: >90 mL/min (ref >90)
GFR calc non Af Amer: 84 mL/min — ABNORMAL LOW (ref >90)
Glucose, Bld: 279 mg/dL — ABNORMAL HIGH (ref 70–99)
Potassium: 4 mEq/L (ref 3.7–5.3)
Sodium: 139 mEq/L (ref 137–147)

## 2014-09-02 NOTE — Discharge Instructions (Signed)

## 2014-09-23 ENCOUNTER — Emergency Department (HOSPITAL_COMMUNITY): Payer: Medicare Other

## 2014-09-23 ENCOUNTER — Emergency Department (HOSPITAL_COMMUNITY)
Admission: EM | Admit: 2014-09-23 | Discharge: 2014-09-23 | Disposition: A | Discharge status: Home / Self Care | Payer: Medicare Other | Attending: Emergency Medicine | Admitting: Emergency Medicine

## 2014-09-23 ENCOUNTER — Encounter (HOSPITAL_COMMUNITY): Payer: Self-pay | Admitting: Emergency Medicine

## 2014-09-23 DIAGNOSIS — R300 Dysuria: Secondary | ICD-10-CM

## 2014-09-23 DIAGNOSIS — R109 Unspecified abdominal pain: Secondary | ICD-10-CM

## 2014-09-23 DIAGNOSIS — I252 Old myocardial infarction: Secondary | ICD-10-CM | POA: Diagnosis not present

## 2014-09-23 DIAGNOSIS — E1165 Type 2 diabetes mellitus with hyperglycemia: Secondary | ICD-10-CM | POA: Insufficient documentation

## 2014-09-23 DIAGNOSIS — Z79899 Other long term (current) drug therapy: Secondary | ICD-10-CM | POA: Insufficient documentation

## 2014-09-23 DIAGNOSIS — Z87891 Personal history of nicotine dependence: Secondary | ICD-10-CM | POA: Diagnosis not present

## 2014-09-23 DIAGNOSIS — Z792 Long term (current) use of antibiotics: Secondary | ICD-10-CM | POA: Diagnosis not present

## 2014-09-23 DIAGNOSIS — H9193 Unspecified hearing loss, bilateral: Secondary | ICD-10-CM | POA: Diagnosis not present

## 2014-09-23 DIAGNOSIS — I2510 Atherosclerotic heart disease of native coronary artery without angina pectoris: Secondary | ICD-10-CM | POA: Insufficient documentation

## 2014-09-23 DIAGNOSIS — Z8719 Personal history of other diseases of the digestive system: Secondary | ICD-10-CM | POA: Insufficient documentation

## 2014-09-23 DIAGNOSIS — I10 Essential (primary) hypertension: Secondary | ICD-10-CM | POA: Insufficient documentation

## 2014-09-23 DIAGNOSIS — Z7982 Long term (current) use of aspirin: Secondary | ICD-10-CM | POA: Insufficient documentation

## 2014-09-23 DIAGNOSIS — R739 Hyperglycemia, unspecified: Secondary | ICD-10-CM

## 2014-09-23 LAB — CBC WITH DIFFERENTIAL/PLATELET
Basophils Absolute: 0 10*3/uL (ref 0.0–0.1)
Basophils Relative: 0 % (ref 0–1)
Eosinophils Absolute: 0.1 10*3/uL (ref 0.0–0.7)
Eosinophils Relative: 2 % (ref 0–5)
HCT: 38.9 % — ABNORMAL LOW (ref 39.0–52.0)
Hemoglobin: 13.4 g/dL (ref 13.0–17.0)
Lymphocytes Relative: 17 % (ref 12–46)
Lymphs Abs: 0.9 10*3/uL (ref 0.7–4.0)
MCH: 30.2 pg (ref 26.0–34.0)
MCHC: 34.4 g/dL (ref 30.0–36.0)
MCV: 87.6 fL (ref 78.0–100.0)
Monocytes Absolute: 0.5 10*3/uL (ref 0.1–1.0)
Monocytes Relative: 10 % (ref 3–12)
Neutro Abs: 3.7 10*3/uL (ref 1.7–7.7)
Neutrophils Relative %: 71 % (ref 43–77)
Platelets: 109 10*3/uL — ABNORMAL LOW (ref 150–400)
RBC: 4.44 MIL/uL (ref 4.22–5.81)
RDW: 13.2 % (ref 11.5–15.5)
WBC: 5.3 10*3/uL (ref 4.0–10.5)

## 2014-09-23 LAB — URINALYSIS, ROUTINE W REFLEX MICROSCOPIC
Bilirubin Urine: NEGATIVE
Glucose, UA: >1000 mg/dL — AB
Hgb urine dipstick: NEGATIVE
Ketones, ur: NEGATIVE mg/dL
Leukocytes, UA: NEGATIVE
Nitrite: NEGATIVE
Protein, ur: NEGATIVE mg/dL
Specific Gravity, Urine: 1.01 (ref 1.005–1.030)
Urobilinogen, UA: 0.2 mg/dL (ref 0.0–1.0)
pH: 6 (ref 5.0–8.0)

## 2014-09-23 LAB — BASIC METABOLIC PANEL
Anion gap: 7 (ref 5–15)
BUN: 14 mg/dL (ref 6–23)
CO2: 23 mmol/L (ref 19–32)
Calcium: 9 mg/dL (ref 8.4–10.5)
Chloride: 106 mEq/L (ref 96–112)
Creatinine, Ser: 0.85 mg/dL (ref 0.50–1.35)
GFR calc Af Amer: >90 mL/min (ref >90)
GFR calc non Af Amer: 85 mL/min — ABNORMAL LOW (ref >90)
Glucose, Bld: 378 mg/dL — ABNORMAL HIGH (ref 70–99)
Potassium: 4.1 mmol/L (ref 3.5–5.1)
Sodium: 136 mmol/L (ref 135–145)

## 2014-09-23 LAB — URINE MICROSCOPIC-ADD ON

## 2014-09-23 MED ORDER — SODIUM CHLORIDE 0.9 % IV BOLUS (SEPSIS)
1000.0000 mL | Freq: Once | INTRAVENOUS | Status: AC
  Administered 2014-09-23: 1000 mL via INTRAVENOUS

## 2014-09-23 MED ORDER — KETOROLAC TROMETHAMINE 30 MG/ML IJ SOLN
15.0000 mg | Freq: Once | INTRAMUSCULAR | Status: AC
  Administered 2014-09-23: 15 mg via INTRAVENOUS
  Filled 2014-09-23: qty 1

## 2014-09-23 MED ORDER — PHENAZOPYRIDINE HCL 200 MG PO TABS: 200.0000 mg | ORAL_TABLET | Freq: Three times a day (TID) | ORAL | Status: DC | PRN

## 2014-09-23 NOTE — ED Provider Notes (Signed)
This chart was scribed for [place], DO by Forrestine Him, ED Scribe. This patient was seen in room APA14/APA14 and the patient's care was started 9:12 AM.   TIME SEEN: 9:12 AM   CHIEF COMPLAINT:  Chief Complaint  Patient presents with  . Dysuria     HPI:  HPI Comments: Troy [name] is a 73 y.o. male with a PMHx of DM, HTN, CAD, GERD, and arterial occlusive disease who presents to the Emergency Department complaining of constant dysuria x 6 weeks that is unchanged. Pt has been seen twice for same and prescribed Bactrim and Cipro BID for a week each. He states symptoms are improved while on antibiotics. However, symptoms are returning after completion of antibiotics. Symptoms never completely resolve while on antibiotics. Troy [name] has called to establish with a Urologist but says first available appointments are too far out to that he has not yet scheduled appointment. He is also not seen his PCP for this.. He also reports intermittent nausea and right flank pain. No recent fever or chills. No hematuria. Pt admits to passing a kidney stone approximately 2-3 weeks ago. No History of trauma to this area. No pain with bowel movements. Is not sexually active. No known allergies to medications.   ROS: See HPI Constitutional: no fever  Eyes: no drainage  ENT: no runny nose   Cardiovascular:  no chest pain  Resp: no SOB  GI: no vomiting. Positive for nausea GU: Positive for dysuria  Integumentary: no rash  Allergy: no hives  Musculoskeletal: no leg swelling. Positive for back pain Neurological: no slurred speech ROS otherwise negative  PAST MEDICAL HISTORY/PAST SURGICAL HISTORY:  Past Medical History  Diagnosis Date  . Atherosclerosis of native arteries of the extremities with intermittent claudication   . Diabetes mellitus   . Hypertension   . CAD (coronary artery disease)   . Myocardial infarction 1996  . Arterial occlusive disease   . GERD (gastroesophageal reflux disease)    . Hard of hearing     has hearing aids /BIL    MEDICATIONS:  Prior to Admission medications   Medication Sig Start Date End Date Taking? Authorizing Provider  Ascorbic Acid (VITAMIN C) 1000 MG tablet Take 1,000 mg by mouth daily.      Historical Provider, MD  aspirin 81 MG EC tablet Take 81 mg by mouth daily.      Historical Provider, MD  Calcium Carbonate-Vitamin D (OYSTER SHELL CALCIUM 500 + D PO) Take 1 tablet by mouth daily.      Historical Provider, MD  ciprofloxacin (CIPRO) 500 MG tablet Take 1 tablet (500 mg total) by mouth 2 (two) times daily. 08/30/14   [name], MD  fish oil-omega-3 fatty acids 1000 MG capsule Take 2 g by mouth daily.      Historical Provider, MD  glimepiride (AMARYL) 2 MG tablet Take 2 mg by mouth daily before breakfast.      Historical Provider, MD  Saxagliptin-Metformin (KOMBIGLYZE XR) 01-999 MG TB24 Take 1 tablet by mouth daily.     Historical Provider, MD  simvastatin (ZOCOR) 20 MG tablet Take 20 mg by mouth every evening.     Historical Provider, MD  valsartan (DIOVAN) 80 MG tablet Take 80 mg by mouth daily.    Historical Provider, MD  zolpidem (AMBIEN) 10 MG tablet Take 1 tablet by mouth Daily. 06/01/11   Historical Provider, MD    ALLERGIES:  No Known Allergies  SOCIAL HISTORY:  History  Substance  Use Topics  . Smoking status: Former Smoker -- 1.00 packs/day for 30 years    Types: Cigarettes    Quit date: 09/27/1990  . Smokeless tobacco: Never Used  . Alcohol Use: No    FAMILY HISTORY: Family History  Problem Relation Age of Onset  . Heart disease Mother   . Heart disease Father   . Cancer Brother     EXAM: BP 177/67 mmHg  Pulse 75  Temp(Src) 97.7 F (36.5 C) (Oral)  Resp 18  Ht 5\' 8"  (1.727 m)  Wt 217 lb (98.431 kg)  BMI 33.00 kg/m2  SpO2 99% CONSTITUTIONAL: Alert and oriented and responds appropriately to questions. Well-appearing; well-nourished HEAD: Normocephalic EYES: Conjunctivae clear, PERRL ENT: normal nose; no  rhinorrhea; moist mucous membranes; pharynx without lesions noted NECK: Supple, no meningismus, no LAD  CARD: RRR; S1 and S2 appreciated; no murmurs, no clicks, no rubs, no gallops RESP: Normal chest excursion without splinting or tachypnea; breath sounds clear and equal bilaterally; no wheezes, no rhonchi, no rales,  ABD/GI: Normal bowel sounds; non-distended; soft, non-tender, no rebound, no guarding GU: normal external genitalia; no testicular masses or tenderness, no scrotal swelling, normal urethral meatus, no penile discharge, no genital lesions, 2 plus femoral pulses bilaterally, no perineal warmth or erythema or crepitus BACK:  The back appears normal and is non-tender to palpation, there is no CVA tenderness EXT: Normal ROM in all joints; non-tender to palpation; no edema; normal capillary refill; no cyanosis    SKIN: Normal color for age and race; warm NEURO: Moves all extremities equally PSYCH: The patient's mood and manner are appropriate. Grooming and personal hygiene are appropriate.  MEDICAL DECISION MAKING: Patient here with dysuria. He has a normal genital exam. He does report intermittent right flank pain and has a history of kidney stones. We'll obtain labs, urine and urine culture, noncontrast CT of his abdomen and pelvis. We'll give Toradol for pain.  ED PROGRESS: 11:15 AM  Pt's workup has been unremarkable. He is hyperglycemic but no signs of DKA, anion gap is 7, bicarbonate 23 and no ketones in his urine. Urine shows no sign of infection but a urine culture is pending. No leukocytosis. CT of his abdomen and pelvis shows bilateral renal stones without obstruction. There is also a right posterior lateral 10th rib fracture that is healing. Cholelithiasis without tenderness in the right upper quadrant are Murphy sign. He does have mild constipation but reports normal bowel movements and passing gas. No hydronephrosis or obstructing stone visualized in the ureter or urethra. Prostate  is normal in size. Patient reports the only pain he has is when he urinates. Have advised him that he needs to follow-up with his PCP and urologist for this. Will give outpatient [facility] follow-up information. We'll also discharge with prescription for Pyridium. At this time I do not feel antibiotics are indicated and he agrees. Discussed strict return precautions. They verbalize understanding and are comfortable with plan.   I personally performed the services described in this documentation, which was scribed in my presence. The recorded information has been reviewed and is accurate.    [place], DO 09/23/14 [phone]

## 2014-09-23 NOTE — Discharge Instructions (Signed)
Dysuria Dysuria is the medical term for pain with urination. There are many causes for dysuria, but urinary tract infection is the most common. If a urinalysis was performed it can show that there is a urinary tract infection. A urine culture confirms that you or your child is sick. You will need to follow up with a healthcare provider because:  If a urine culture was done you will need to know the culture results and treatment recommendations.  If the urine culture was positive, you or your child will need to be put on antibiotics or know if the antibiotics prescribed are the right antibiotics for your urinary tract infection.  If the urine culture is negative (no urinary tract infection), then other causes may need to be explored or antibiotics need to be stopped. Today laboratory work may have been done and there does not seem to be an infection. If cultures were done they will take at least 24 to 48 hours to be completed. Today x-rays may have been taken and they read as normal. No cause can be found for the problems. The x-rays may be re-read by a radiologist and you will be contacted if additional findings are made. You or your child may have been put on medications to help with this problem until you can see your primary caregiver. If the problems get better, see your primary caregiver if the problems return. If you were given antibiotics (medications which kill germs), take all of the mediations as directed for the full course of treatment.  If laboratory work was done, you need to find the results. Leave a telephone number where you can be reached. If this is not possible, make sure you find out how you are to get test results. HOME CARE INSTRUCTIONS   Drink lots of fluids. For adults, drink eight, 8 ounce glasses of clear juice or water a day. For children, replace fluids as suggested by your caregiver.  Empty the bladder often. Avoid holding urine for long periods of time.  After a bowel  movement, women should cleanse front to back, using each tissue only once.  Empty your bladder before and after sexual intercourse.  Take all the medicine given to you until it is gone. You may feel better in a few days, but TAKE ALL MEDICINE.  Avoid caffeine, tea, alcohol and carbonated beverages, because they tend to irritate the bladder.  In men, alcohol may irritate the prostate.  Only take over-the-counter or prescription medicines for pain, discomfort, or fever as directed by your caregiver.  If your caregiver has given you a follow-up appointment, it is very important to keep that appointment. Not keeping the appointment could result in a chronic or permanent injury, pain, and disability. If there is any problem keeping the appointment, you must call back to this facility for assistance. SEEK IMMEDIATE MEDICAL CARE IF:   Back pain develops.  A fever develops.  There is nausea (feeling sick to your stomach) or vomiting (throwing up).  Problems are no better with medications or are getting worse. MAKE SURE YOU:   Understand these instructions.  Will watch your condition.  Will get help right away if you are not doing well or get worse. Document Released: 06/11/2004 Document Revised: 12/06/2011 Document Reviewed: 04/18/2008 [facility] Patient Information 2015 [place], [place]. This information is not intended to replace advice given to you by your health care provider. Make sure you discuss any questions you have with your health care provider.  Hyperglycemia Hyperglycemia occurs  when the glucose (sugar) in your blood is too high. Hyperglycemia can happen for many reasons, but it most often happens to people who do not know they have diabetes or are not managing their diabetes properly.  CAUSES  Whether you have diabetes or not, there are other causes of hyperglycemia. Hyperglycemia can occur when you have diabetes, but it can also occur in other situations that you might not be  as aware of, such as: Diabetes  If you have diabetes and are having problems controlling your blood glucose, hyperglycemia could occur because of some of the following reasons:  Not following your meal plan.  Not taking your diabetes medications or not taking it properly.  Exercising less or doing less activity than you normally do.  Being sick. Pre-diabetes  This cannot be ignored. Before people develop Type 2 diabetes, they almost always have "pre-diabetes." This is when your blood glucose levels are higher than normal, but not yet high enough to be diagnosed as diabetes. Research has shown that some long-term damage to the body, especially the heart and circulatory system, may already be occurring during pre-diabetes. If you take action to manage your blood glucose when you have pre-diabetes, you may delay or prevent Type 2 diabetes from developing. Stress  If you have diabetes, you may be "diet" controlled or on oral medications or insulin to control your diabetes. However, you may find that your blood glucose is higher than usual in the hospital whether you have diabetes or not. This is often referred to as "stress hyperglycemia." Stress can elevate your blood glucose. This happens because of hormones put out by the body during times of stress. If stress has been the cause of your high blood glucose, it can be followed regularly by your caregiver. That way he/she can make sure your hyperglycemia does not continue to get worse or progress to diabetes. Steroids  Steroids are medications that act on the infection fighting system (immune system) to block inflammation or infection. One side effect can be a rise in blood glucose. Most people can produce enough extra insulin to allow for this rise, but for those who cannot, steroids make blood glucose levels go even higher. It is not unusual for steroid treatments to "uncover" diabetes that is developing. It is not always possible to determine if  the hyperglycemia will go away after the steroids are stopped. A special blood test called an A1c is sometimes done to determine if your blood glucose was elevated before the steroids were started. SYMPTOMS  Thirsty.  Frequent urination.  Dry mouth.  Blurred vision.  Tired or fatigue.  Weakness.  Sleepy.  Tingling in feet or leg. DIAGNOSIS  Diagnosis is made by monitoring blood glucose in one or all of the following ways:  A1c test. This is a chemical found in your blood.  Fingerstick blood glucose monitoring.  Laboratory results. TREATMENT  First, knowing the cause of the hyperglycemia is important before the hyperglycemia can be treated. Treatment may include, but is not be limited to:  Education.  Change or adjustment in medications.  Change or adjustment in meal plan.  Treatment for an illness, infection, etc.  More frequent blood glucose monitoring.  Change in exercise plan.  Decreasing or stopping steroids.  Lifestyle changes. HOME CARE INSTRUCTIONS   Test your blood glucose as directed.  Exercise regularly. Your caregiver will give you instructions about exercise. Pre-diabetes or diabetes which comes on with stress is helped by exercising.  Eat wholesome, balanced meals.  Eat often and at regular, fixed times. Your caregiver or nutritionist will give you a meal plan to guide your sugar intake.  Being at an ideal weight is important. If needed, losing as little as 10 to 15 pounds may help improve blood glucose levels. SEEK MEDICAL CARE IF:   You have questions about medicine, activity, or diet.  You continue to have symptoms (problems such as increased thirst, urination, or weight gain). SEEK IMMEDIATE MEDICAL CARE IF:   You are vomiting or have diarrhea.  Your breath smells fruity.  You are breathing faster or slower.  You are very sleepy or incoherent.  You have numbness, tingling, or pain in your feet or hands.  You have chest  pain.  Your symptoms get worse even though you have been following your caregiver's orders.  If you have any other questions or concerns. Document Released: 03/09/2001 Document Revised: 12/06/2011 Document Reviewed: 01/10/2012 [facility] Patient Information 2015 [place], [place]. This information is not intended to replace advice given to you by your health care provider. Make sure you discuss any questions you have with your health care provider.

## 2014-09-23 NOTE — ED Notes (Signed)
MD at bedside. 

## 2014-09-23 NOTE — ED Notes (Signed)
Pt reports continued dysuria. Pt reports seen for same x2 weeks ago. Pt reports decreased urination and dysuria x6 weeks. Pt reports has been seen for same x3 times with no relief. Pt reports "i get prescribed abx and i feel better but once i run out of px the pain comes back." nad noted.

## 2014-09-25 LAB — URINE CULTURE
Colony Count: NO GROWTH
Culture: NO GROWTH

## 2015-01-03 ENCOUNTER — Encounter (HOSPITAL_COMMUNITY): Payer: Self-pay | Admitting: Physical Medicine and Rehabilitation

## 2015-01-03 ENCOUNTER — Emergency Department (HOSPITAL_COMMUNITY): Payer: Medicare Other

## 2015-01-03 ENCOUNTER — Inpatient Hospital Stay (HOSPITAL_COMMUNITY)
Admission: EM | Admit: 2015-01-03 | Discharge: 2015-01-07 | DRG: 287 | Disposition: A | Discharge status: Home / Self Care | Payer: Medicare Other | Attending: Cardiovascular Disease | Admitting: Cardiovascular Disease

## 2015-01-03 DIAGNOSIS — H9193 Unspecified hearing loss, bilateral: Secondary | ICD-10-CM | POA: Diagnosis present

## 2015-01-03 DIAGNOSIS — Z79899 Other long term (current) drug therapy: Secondary | ICD-10-CM | POA: Diagnosis not present

## 2015-01-03 DIAGNOSIS — I25710 Atherosclerosis of autologous vein coronary artery bypass graft(s) with unstable angina pectoris: Secondary | ICD-10-CM | POA: Diagnosis present

## 2015-01-03 DIAGNOSIS — Z87891 Personal history of nicotine dependence: Secondary | ICD-10-CM

## 2015-01-03 DIAGNOSIS — Z8249 Family history of ischemic heart disease and other diseases of the circulatory system: Secondary | ICD-10-CM | POA: Diagnosis not present

## 2015-01-03 DIAGNOSIS — I2510 Atherosclerotic heart disease of native coronary artery without angina pectoris: Secondary | ICD-10-CM | POA: Diagnosis present

## 2015-01-03 DIAGNOSIS — I25110 Atherosclerotic heart disease of native coronary artery with unstable angina pectoris: Secondary | ICD-10-CM

## 2015-01-03 DIAGNOSIS — I493 Ventricular premature depolarization: Secondary | ICD-10-CM | POA: Diagnosis not present

## 2015-01-03 DIAGNOSIS — D696 Thrombocytopenia, unspecified: Secondary | ICD-10-CM | POA: Diagnosis present

## 2015-01-03 DIAGNOSIS — I2584 Coronary atherosclerosis due to calcified coronary lesion: Secondary | ICD-10-CM | POA: Diagnosis present

## 2015-01-03 DIAGNOSIS — I200 Unstable angina: Secondary | ICD-10-CM | POA: Diagnosis present

## 2015-01-03 DIAGNOSIS — I2582 Chronic total occlusion of coronary artery: Secondary | ICD-10-CM | POA: Diagnosis present

## 2015-01-03 DIAGNOSIS — K219 Gastro-esophageal reflux disease without esophagitis: Secondary | ICD-10-CM | POA: Diagnosis present

## 2015-01-03 DIAGNOSIS — I70213 Atherosclerosis of native arteries of extremities with intermittent claudication, bilateral legs: Secondary | ICD-10-CM | POA: Diagnosis present

## 2015-01-03 DIAGNOSIS — I472 Ventricular tachycardia: Secondary | ICD-10-CM | POA: Diagnosis present

## 2015-01-03 DIAGNOSIS — Z6831 Body mass index (BMI) 31.0-31.9, adult: Secondary | ICD-10-CM | POA: Diagnosis not present

## 2015-01-03 DIAGNOSIS — I255 Ischemic cardiomyopathy: Secondary | ICD-10-CM | POA: Diagnosis present

## 2015-01-03 DIAGNOSIS — Z974 Presence of external hearing-aid: Secondary | ICD-10-CM | POA: Diagnosis not present

## 2015-01-03 DIAGNOSIS — I10 Essential (primary) hypertension: Secondary | ICD-10-CM | POA: Diagnosis present

## 2015-01-03 DIAGNOSIS — Z7982 Long term (current) use of aspirin: Secondary | ICD-10-CM

## 2015-01-03 DIAGNOSIS — I252 Old myocardial infarction: Secondary | ICD-10-CM | POA: Diagnosis not present

## 2015-01-03 DIAGNOSIS — E669 Obesity, unspecified: Secondary | ICD-10-CM | POA: Diagnosis present

## 2015-01-03 DIAGNOSIS — R079 Chest pain, unspecified: Secondary | ICD-10-CM | POA: Diagnosis present

## 2015-01-03 DIAGNOSIS — E119 Type 2 diabetes mellitus without complications: Secondary | ICD-10-CM

## 2015-01-03 DIAGNOSIS — I4729 Other ventricular tachycardia: Secondary | ICD-10-CM

## 2015-01-03 HISTORY — DX: Thrombocytopenia, unspecified: D69.6

## 2015-01-03 LAB — TROPONIN I: Troponin I: 0.03 ng/mL (ref <0.031)

## 2015-01-03 LAB — CBC WITH DIFFERENTIAL/PLATELET
Basophils Absolute: 0 10*3/uL (ref 0.0–0.1)
Basophils Relative: 0 % (ref 0–1)
Eosinophils Absolute: 0.1 10*3/uL (ref 0.0–0.7)
Eosinophils Relative: 1 % (ref 0–5)
HCT: 40.3 % (ref 39.0–52.0)
Hemoglobin: 14.2 g/dL (ref 13.0–17.0)
Lymphocytes Relative: 17 % (ref 12–46)
Lymphs Abs: 1 10*3/uL (ref 0.7–4.0)
MCH: 30.5 pg (ref 26.0–34.0)
MCHC: 35.2 g/dL (ref 30.0–36.0)
MCV: 86.5 fL (ref 78.0–100.0)
Monocytes Absolute: 0.7 10*3/uL (ref 0.1–1.0)
Monocytes Relative: 12 % (ref 3–12)
Neutro Abs: 4 10*3/uL (ref 1.7–7.7)
Neutrophils Relative %: 70 % (ref 43–77)
Platelets: 107 10*3/uL — ABNORMAL LOW (ref 150–400)
RBC: 4.66 MIL/uL (ref 4.22–5.81)
RDW: 13.2 % (ref 11.5–15.5)
WBC: 5.8 10*3/uL (ref 4.0–10.5)

## 2015-01-03 LAB — COMPREHENSIVE METABOLIC PANEL
ALT: 32 U/L (ref 0–53)
AST: 39 U/L — ABNORMAL HIGH (ref 0–37)
Albumin: 3.8 g/dL (ref 3.5–5.2)
Alkaline Phosphatase: 49 U/L (ref 39–117)
Anion gap: 5 (ref 5–15)
BUN: 10 mg/dL (ref 6–23)
CO2: 26 mmol/L (ref 19–32)
Calcium: 9.3 mg/dL (ref 8.4–10.5)
Chloride: 107 mmol/L (ref 96–112)
Creatinine, Ser: 0.76 mg/dL (ref 0.50–1.35)
GFR calc Af Amer: >90 mL/min (ref >90)
GFR calc non Af Amer: 88 mL/min — ABNORMAL LOW (ref >90)
Glucose, Bld: 225 mg/dL — ABNORMAL HIGH (ref 70–99)
Potassium: 4 mmol/L (ref 3.5–5.1)
Sodium: 138 mmol/L (ref 135–145)
Total Bilirubin: 1 mg/dL (ref 0.3–1.2)
Total Protein: 6.9 g/dL (ref 6.0–8.3)

## 2015-01-03 LAB — CBG MONITORING, ED: Glucose-Capillary: 141 mg/dL — ABNORMAL HIGH (ref 70–99)

## 2015-01-03 LAB — HEPARIN LEVEL (UNFRACTIONATED): Heparin Unfractionated: 0.2 IU/mL — ABNORMAL LOW (ref 0.30–0.70)

## 2015-01-03 LAB — I-STAT TROPONIN, ED: Troponin i, poc: 0.01 ng/mL (ref 0.00–0.08)

## 2015-01-03 LAB — GLUCOSE, CAPILLARY: Glucose-Capillary: 204 mg/dL — ABNORMAL HIGH (ref 70–99)

## 2015-01-03 LAB — MRSA PCR SCREENING: MRSA by PCR: NEGATIVE

## 2015-01-03 LAB — TSH: TSH: 1.004 u[IU]/mL (ref 0.350–4.500)

## 2015-01-03 MED ORDER — ACETAMINOPHEN 325 MG PO TABS: 650.0000 mg | ORAL_TABLET | ORAL | Status: DC | PRN

## 2015-01-03 MED ORDER — GLIMEPIRIDE 1 MG PO TABS
2.0000 mg | ORAL_TABLET | Freq: Every day | ORAL | Status: DC
  Administered 2015-01-04 – 2015-01-07 (×3): 2 mg via ORAL
  Filled 2015-01-03 (×3): qty 2

## 2015-01-03 MED ORDER — HEPARIN BOLUS VIA INFUSION
4000.0000 [IU] | Freq: Once | INTRAVENOUS | Status: AC
  Administered 2015-01-03: 4000 [IU] via INTRAVENOUS
  Filled 2015-01-03: qty 4000

## 2015-01-03 MED ORDER — HEPARIN (PORCINE) IN NACL 100-0.45 UNIT/ML-% IJ SOLN
1100.0000 [IU]/h | INTRAMUSCULAR | Status: DC
  Administered 2015-01-03: 1100 [IU]/h via INTRAVENOUS
  Filled 2015-01-03: qty 250

## 2015-01-03 MED ORDER — ZOLPIDEM TARTRATE 5 MG PO TABS
5.0000 mg | ORAL_TABLET | Freq: Every day | ORAL | Status: DC
  Administered 2015-01-03 – 2015-01-06 (×4): 5 mg via ORAL
  Filled 2015-01-03 (×5): qty 1

## 2015-01-03 MED ORDER — HEPARIN (PORCINE) IN NACL 100-0.45 UNIT/ML-% IJ SOLN
1450.0000 [IU]/h | INTRAMUSCULAR | Status: DC
  Administered 2015-01-04: 1350 [IU]/h via INTRAVENOUS
  Administered 2015-01-05 (×2): 1450 [IU]/h via INTRAVENOUS
  Filled 2015-01-03 (×3): qty 250

## 2015-01-03 MED ORDER — ONDANSETRON HCL 4 MG/2ML IJ SOLN
4.0000 mg | Freq: Four times a day (QID) | INTRAMUSCULAR | Status: DC | PRN
  Administered 2015-01-06: 4 mg via INTRAVENOUS
  Filled 2015-01-03: qty 2

## 2015-01-03 MED ORDER — CHLORHEXIDINE GLUCONATE CLOTH 2 % EX PADS
6.0000 | MEDICATED_PAD | Freq: Once | CUTANEOUS | Status: AC
  Administered 2015-01-03: 6 via TOPICAL

## 2015-01-03 MED ORDER — NITROGLYCERIN 0.4 MG SL SUBL: 0.4000 mg | SUBLINGUAL_TABLET | SUBLINGUAL | Status: DC | PRN

## 2015-01-03 MED ORDER — SIMVASTATIN 20 MG PO TABS
20.0000 mg | ORAL_TABLET | Freq: Every evening | ORAL | Status: DC
  Administered 2015-01-04 – 2015-01-05 (×2): 20 mg via ORAL
  Filled 2015-01-03 (×2): qty 1

## 2015-01-03 MED ORDER — ASPIRIN EC 81 MG PO TBEC
81.0000 mg | DELAYED_RELEASE_TABLET | Freq: Every day | ORAL | Status: DC
  Administered 2015-01-04 – 2015-01-07 (×4): 81 mg via ORAL
  Filled 2015-01-03 (×7): qty 1

## 2015-01-03 MED ORDER — ASPIRIN 81 MG PO CHEW
324.0000 mg | CHEWABLE_TABLET | Freq: Once | ORAL | Status: AC
  Administered 2015-01-03: 324 mg via ORAL
  Filled 2015-01-03: qty 4

## 2015-01-03 MED ORDER — INSULIN ASPART 100 UNIT/ML ~~LOC~~ SOLN
0.0000 [IU] | Freq: Three times a day (TID) | SUBCUTANEOUS | Status: DC
  Administered 2015-01-04: 3 [IU] via SUBCUTANEOUS
  Administered 2015-01-04: 2 [IU] via SUBCUTANEOUS
  Administered 2015-01-04: 5 [IU] via SUBCUTANEOUS
  Administered 2015-01-05: 2 [IU] via SUBCUTANEOUS
  Administered 2015-01-05 (×2): 3 [IU] via SUBCUTANEOUS

## 2015-01-03 MED ORDER — IRBESARTAN 75 MG PO TABS
37.5000 mg | ORAL_TABLET | Freq: Every day | ORAL | Status: DC
  Administered 2015-01-04 – 2015-01-07 (×4): 37.5 mg via ORAL
  Filled 2015-01-03 (×4): qty 1

## 2015-01-03 MED ORDER — NITROGLYCERIN IN D5W 200-5 MCG/ML-% IV SOLN
2.0000 ug/min | INTRAVENOUS | Status: DC
  Administered 2015-01-03: 5 ug/min via INTRAVENOUS
  Administered 2015-01-05: 20 ug/min via INTRAVENOUS
  Filled 2015-01-03 (×2): qty 250

## 2015-01-03 NOTE — ED Provider Notes (Addendum)
CSN: [id number]     Arrival date & time 01/03/15  1038 History   First MD Initiated Contact with Patient 01/03/15 1051     Chief Complaint  Patient presents with  . Chest Pain     (Consider location/radiation/quality/duration/timing/severity/associated sxs/prior Treatment) Patient is a 74 y.o. male presenting with chest pain. The history is provided by the patient.  Chest Pain Pain location:  Substernal area Pain quality: aching and pressure   Pain radiates to:  Does not radiate Pain radiates to the back: no   Pain severity:  Moderate Onset quality:  Gradual Duration:  1 day Timing:  Constant Progression:  Unchanged Chronicity:  New Context comment:  States recently he has been more active doing yard work and fixing vehicles Relieved by:  Nothing Worsened by:  Nothing tried Ineffective treatments:  None tried Associated symptoms: no abdominal pain, no anorexia, no cough, no dizziness, no fever, no lower extremity edema, no nausea, no palpitations, no shortness of breath, not vomiting and no weakness   Risk factors: coronary artery disease, diabetes mellitus, high cholesterol, hypertension and male sex   Risk factors: no immobilization, no smoking and no surgery     Past Medical History  Diagnosis Date  . Atherosclerosis of native arteries of the extremities with intermittent claudication   . Diabetes mellitus   . Hypertension   . CAD (coronary artery disease)   . Myocardial infarction 1996  . Arterial occlusive disease   . GERD (gastroesophageal reflux disease)   . Hard of hearing     has hearing aids /BIL   Past Surgical History  Procedure Laterality Date  . Colonoscopy    . Coronary artery bypass graft  1996    5 vessels  . Cataract extraction w/phaco Right 08/06/2013    Procedure: CATARACT EXTRACTION PHACO AND INTRAOCULAR LENS PLACEMENT RIGHT EYE;  Surgeon: Tonny Branch, MD;  Location: [facility];  Service: Ophthalmology;  Laterality: Right;  CDE:  15.48  . Hernia repair       groin-25 yrs ago  . Colonoscopy with propofol N/A 10/22/2013    Procedure: COLONOSCOPY WITH PROPOFOL;  Surgeon: Irene Shipper, MD;  Location: [facility];  Service: Endoscopy;  Laterality: N/A;   Family History  Problem Relation Age of Onset  . Heart disease Mother   . Heart disease Father   . Cancer Brother    History  Substance Use Topics  . Smoking status: Former Smoker -- 1.00 packs/day for 30 years    Types: Cigarettes    Quit date: 09/27/1990  . Smokeless tobacco: Never Used  . Alcohol Use: No    Review of Systems  Constitutional: Negative for fever.  Respiratory: Negative for cough and shortness of breath.   Cardiovascular: Positive for chest pain. Negative for palpitations.  Gastrointestinal: Negative for nausea, vomiting, abdominal pain and anorexia.  Neurological: Negative for dizziness and weakness.  All other systems reviewed and are negative.     Allergies  Review of patient's allergies indicates no known allergies.  Home Medications   Prior to Admission medications   Medication Sig Start Date End Date Taking? Authorizing Provider  Ascorbic Acid (VITAMIN C) 1000 MG tablet Take 1,000 mg by mouth daily.      Historical Provider, MD  aspirin 81 MG EC tablet Take 81 mg by mouth daily.      Historical Provider, MD  Calcium Carbonate-Vitamin D (OYSTER SHELL CALCIUM 500 + D PO) Take 1 tablet by mouth daily.  Historical Provider, MD  ciprofloxacin (CIPRO) 500 MG tablet Take 1 tablet (500 mg total) by mouth 2 (two) times daily. Patient not taking: Reported on 09/23/2014 08/30/14   [name], MD  fish oil-omega-3 fatty acids 1000 MG capsule Take 2 g by mouth daily.      Historical Provider, MD  glimepiride (AMARYL) 2 MG tablet Take 2 mg by mouth daily before breakfast.      Historical Provider, MD  HYDROcodone-acetaminophen (NORCO/VICODIN) 5-325 MG per tablet Take 1 tablet by mouth every 6 (six) hours as needed. pain 08/31/14   Historical Provider, MD   lisinopril (PRINIVIL,ZESTRIL) 10 MG tablet Take 10 mg by mouth daily.    Historical Provider, MD  phenazopyridine (PYRIDIUM) 200 MG tablet Take 1 tablet (200 mg total) by mouth 3 (three) times daily as needed for pain. 09/23/14   Kristen N Ward, DO  Saxagliptin-Metformin (KOMBIGLYZE XR) 01-999 MG TB24 Take 1 tablet by mouth daily.     Historical Provider, MD  simvastatin (ZOCOR) 20 MG tablet Take 20 mg by mouth every evening.     Historical Provider, MD  zolpidem (AMBIEN) 10 MG tablet Take 1 tablet by mouth Daily. 06/01/11   Historical Provider, MD   BP 174/68 mmHg  Pulse 64  Temp(Src) 98.3 F (36.8 C) (Oral)  Resp 18  Ht 5\' 9"  (1.753 m)  Wt 216 lb (97.977 kg)  BMI 31.88 kg/m2  SpO2 96% Physical Exam  Constitutional: He is oriented to person, place, and time. He appears well-developed and well-nourished. No distress.  HENT:  Head: Normocephalic and atraumatic.  Mouth/Throat: Oropharynx is clear and moist.  Eyes: Conjunctivae and EOM are normal. Pupils are equal, round, and reactive to light.  Neck: Normal range of motion. Neck supple.  Cardiovascular: Normal rate, regular rhythm and intact distal pulses.   No murmur heard. Pulmonary/Chest: Effort normal and breath sounds normal. No respiratory distress. He has no wheezes. He has no rales. He exhibits no tenderness.  Abdominal: Soft. He exhibits no distension. There is no tenderness. There is no rebound and no guarding.  Musculoskeletal: Normal range of motion. He exhibits no edema or tenderness.  Neurological: He is alert and oriented to person, place, and time.  Skin: Skin is warm and dry. No rash noted. No erythema.  Psychiatric: He has a normal mood and affect. His behavior is normal.  Nursing note and vitals reviewed.   ED Course  Procedures (including critical care time) Labs Review Labs Reviewed  COMPREHENSIVE METABOLIC PANEL - Abnormal; Notable for the following:    Glucose, Bld 225 (*)    AST 39 (*)    GFR calc non Af  Amer 88 (*)    All other components within normal limits  CBC WITH DIFFERENTIAL/PLATELET  Randolm Idol, ED    Imaging Review Dg Chest 2 View  01/03/2015   CLINICAL DATA:  Sternal chest pain for 2 days.  Initial encounter.  EXAM: CHEST  2 VIEW  COMPARISON:  Abdominal CT 10/10/2014.  FINDINGS: The heart size and mediastinal contours are normal status post median sternotomy and CABG. One of the sternal wires is fractured. Mild linear atelectasis or scarring at the left lung base and mild central airway thickening are present. There is no edema, confluent airspace opacity or pleural effusion. No acute osseous findings are seen. The sternum demonstrates no acute findings.  IMPRESSION: No active cardiopulmonary process status post CABG.   Electronically Signed   By: Richardean Sale M.D.   On: 01/03/2015  12:10     EKG Interpretation   Date/Time:  Friday January 03 2015 10:42:16 EDT Ventricular Rate:  63 PR Interval:  244 QRS Duration: 94 QT Interval:  384 QTC Calculation: 392 R Axis:   58 Text Interpretation:  Sinus rhythm with 1st degree A-V block Anteroseptal  infarct , age undetermined ST \\T \ T wave abnormality, consider  inferolateral ischemia No significant change since last tracing Confirmed  by [facility]  MD, Loree Fee ([id number]) on 01/03/2015 10:52:41 AM      MDM   Final diagnoses:  Unstable angina    Patient with a significant history for coronary artery disease, MI, five-vessel CABG and diabetes who presents today with chest pain that started yesterday morning. It is atypical in nature and he feels the pain with every fourth or fifth beat of his heart. However he denies any pleuritic component or GI component to his pain. He states recently he has been a lot more active than usual mowing yards and fixed in vehicles. In the past he will occasionally have this type of pain usually fleeting but has never lasted this long. The last time he had any pain that felt similar to this was  approximately 3 months ago. His last stress test was 2-3 years ago. Since his five-vessel CABG 20 years ago he's never had repeat catheterization were stenting.  Patient's physical exam was within normal limits. At this time concerning for ACS. No history concerning for CHF, infectious etiology or PE.  1:14 PM Initial labs within normal limits. X-ray without acute findings. Spoke with cardiology who will come and evaluate the patient.  He did not receive nitroglycerin however states his symptoms improved with aspirin.  Blanchie Dessert, MD 01/03/15 1315  Blanchie Dessert, MD 01/03/15 1511

## 2015-01-03 NOTE — ED Notes (Signed)
CBG - 141 ° °

## 2015-01-03 NOTE — H&P (Signed)
Cardiologist:  Nishan-Last seen 05/2011 Troy [name] is an 74 y.o. male.   Chief Complaint:  Chest Pain HPI:   The patient is a 74 yo male with a history of CABGx5 in 1996 by Dr. Tyrone Sage, DM, HTN, GERD.  He saw Dr. Ladona Ridgel for consideration of an ICD in 2011.  He has not been seen in the office since 05/2011.  At that time he had LEA dopplers with right ABI 0.9 and left 1.15.  Last nuclear stress test 2010.  Last echo 05/2011 with EF 35%(difficlt to estimate)-akinesis of the entire apex, with slight dyskinesis.  He presents today with chest pain which started yesterday. 5/10.  Feels like pressure like "a dog sitting on your chest."  Then has periodic shooting pain.  Some nausea but he gets that anyway if he does not eat frequently.  He reports doing a lot of yard work the day before(riding mower)-working on cars.  The patient currently denies nausea, vomiting, fever, shortness of breath, orthopnea, dizziness, PND, cough, congestion, abdominal pain, hematochezia, melena, lower extremity edema, claudication.  Medications Medication Sig  Ascorbic Acid (VITAMIN C) 1000 MG tablet Take 1,000 mg by mouth daily.    aspirin 81 MG EC tablet Take 81 mg by mouth daily.    Calcium Carbonate-Vitamin D (OYSTER SHELL CALCIUM 500 + D PO) Take 1 tablet by mouth daily.    fish oil-omega-3 fatty acids 1000 MG capsule Take 2 g by mouth daily.    glimepiride (AMARYL) 2 MG tablet Take 2 mg by mouth daily before breakfast.    Saxagliptin-Metformin (KOMBIGLYZE XR) 01-999 MG TB24 Take 1 tablet by mouth daily.   simvastatin (ZOCOR) 20 MG tablet Take 20 mg by mouth every evening.   valsartan (DIOVAN) 80 MG tablet Take 40 mg by mouth daily.  zolpidem (AMBIEN) 10 MG tablet Take 1 tablet by mouth at bedtime.   ciprofloxacin (CIPRO) 500 MG tablet Take 1 tablet (500 mg total) by mouth 2 (two) times daily. Patient not taking: Reported on 09/23/2014  phenazopyridine (PYRIDIUM) 200 MG tablet Take 1 tablet (200 mg total) by mouth  3 (three) times daily as needed for pain. Patient not taking: Reported on 01/03/2015    Past Medical History  Diagnosis Date  . Atherosclerosis of native arteries of the extremities with intermittent claudication   . Diabetes mellitus   . Hypertension   . CAD (coronary artery disease)   . Myocardial infarction 1996  . Arterial occlusive disease   . GERD (gastroesophageal reflux disease)   . Hard of hearing     has hearing aids /BIL    Past Surgical History  Procedure Laterality Date  . Colonoscopy    . Coronary artery bypass graft  1996    5 vessels  . Cataract extraction w/phaco Right 08/06/2013    Procedure: CATARACT EXTRACTION PHACO AND INTRAOCULAR LENS PLACEMENT RIGHT EYE;  Surgeon: Gemma Payor, MD;  Location: [facility];  Service: Ophthalmology;  Laterality: Right;  CDE:  15.48  . Hernia repair      groin-25 yrs ago  . Colonoscopy with propofol N/A 10/22/2013    Procedure: COLONOSCOPY WITH PROPOFOL;  Surgeon: Hilarie Fredrickson, MD;  Location: [facility];  Service: Endoscopy;  Laterality: N/A;    Family History  Problem Relation Age of Onset  . Heart disease Mother   . Heart disease Father   . Cancer Brother    Social History:  reports that he quit smoking about 24 years ago. His smoking use  included Cigarettes. He has a 30 pack-year smoking history. He has never used smokeless tobacco. He reports that he does not drink alcohol or use illicit drugs.  Allergies: No Known Allergies   (Not in a hospital admission)  Results for orders placed or performed during the hospital encounter of 01/03/15 (from the past 48 hour(s))  CBC with Differential     Status: Abnormal   Collection Time: 01/03/15 12:06 PM  Result Value Ref Range   WBC 5.8 4.0 - 10.5 K/uL   RBC 4.66 4.22 - 5.81 MIL/uL   Hemoglobin 14.2 13.0 - 17.0 g/dL   HCT 40.3 39.0 - 52.0 %   MCV 86.5 78.0 - 100.0 fL   MCH 30.5 26.0 - 34.0 pg   MCHC 35.2 30.0 - 36.0 g/dL   RDW 13.2 11.5 - 15.5 %   Platelets 107 (L) 150 - 400  K/uL    Comment: REPEATED TO VERIFY SPECIMEN CHECKED FOR CLOTS PLATELET COUNT CONFIRMED BY SMEAR    Neutrophils Relative % 70 43 - 77 %   Neutro Abs 4.0 1.7 - 7.7 K/uL   Lymphocytes Relative 17 12 - 46 %   Lymphs Abs 1.0 0.7 - 4.0 K/uL   Monocytes Relative 12 3 - 12 %   Monocytes Absolute 0.7 0.1 - 1.0 K/uL   Eosinophils Relative 1 0 - 5 %   Eosinophils Absolute 0.1 0.0 - 0.7 K/uL   Basophils Relative 0 0 - 1 %   Basophils Absolute 0.0 0.0 - 0.1 K/uL  Comprehensive metabolic panel     Status: Abnormal   Collection Time: 01/03/15 12:06 PM  Result Value Ref Range   Sodium 138 135 - 145 mmol/L   Potassium 4.0 3.5 - 5.1 mmol/L   Chloride 107 96 - 112 mmol/L   CO2 26 19 - 32 mmol/L   Glucose, Bld 225 (H) 70 - 99 mg/dL   BUN 10 6 - 23 mg/dL   Creatinine, Ser 0.76 0.50 - 1.35 mg/dL   Calcium 9.3 8.4 - 10.5 mg/dL   Total Protein 6.9 6.0 - 8.3 g/dL   Albumin 3.8 3.5 - 5.2 g/dL   AST 39 (H) 0 - 37 U/L   ALT 32 0 - 53 U/L   Alkaline Phosphatase 49 39 - 117 U/L   Total Bilirubin 1.0 0.3 - 1.2 mg/dL   GFR calc non Af Amer 88 (L) >90 mL/min   GFR calc Af Amer >90 >90 mL/min    Comment: (NOTE) The eGFR has been calculated using the CKD EPI equation. This calculation has not been validated in all clinical situations. eGFR's persistently <90 mL/min signify possible Chronic Kidney Disease.    Anion gap 5 5 - 15  I-Stat Troponin, ED (not at [facility])     Status: None   Collection Time: 01/03/15 12:13 PM  Result Value Ref Range   Troponin i, poc 0.01 0.00 - 0.08 ng/mL   Comment 3            Comment: Due to the release kinetics of cTnI, a negative result within the first hours of the onset of symptoms does not rule out myocardial infarction with certainty. If myocardial infarction is still suspected, repeat the test at appropriate intervals.    Dg Chest 2 View  01/03/2015   CLINICAL DATA:  Sternal chest pain for 2 days.  Initial encounter.  EXAM: CHEST  2 VIEW  COMPARISON:  Abdominal CT  10/10/2014.  FINDINGS: The heart size and mediastinal contours are  normal status post median sternotomy and CABG. One of the sternal wires is fractured. Mild linear atelectasis or scarring at the left lung base and mild central airway thickening are present. There is no edema, confluent airspace opacity or pleural effusion. No acute osseous findings are seen. The sternum demonstrates no acute findings.  IMPRESSION: No active cardiopulmonary process status post CABG.   Electronically Signed   By: Richardean Sale M.D.   On: 01/03/2015 12:10    Review of Systems  Constitutional: Negative for fever and diaphoresis.  HENT: Negative for sore throat.   Respiratory: Negative for cough and shortness of breath.   Cardiovascular: Positive for chest pain (5/10). Negative for orthopnea, leg swelling and PND.  Gastrointestinal: Negative for nausea, vomiting, abdominal pain, blood in stool and melena.  Genitourinary: Negative for hematuria.  Musculoskeletal: Negative for myalgias.  Neurological: Negative for dizziness.  All other systems reviewed and are negative.   Blood pressure 156/62, pulse 56, temperature 98.3 F (36.8 C), temperature source Oral, resp. rate 12, height $RemoveBe'5\' 9"'MdmTogZzh$  (1.753 m), weight 216 lb (97.977 kg), SpO2 95 %. Physical Exam  Nursing note and vitals reviewed. Constitutional: He is oriented to person, place, and time. He appears well-nourished. No distress.  HENT:  Head: Normocephalic and atraumatic.  Mouth/Throat: No oropharyngeal exudate.  Eyes: EOM are normal. Pupils are equal, round, and reactive to light. No scleral icterus.  Neck: Normal range of motion. Neck supple. No JVD present.  Cardiovascular: Normal rate, regular rhythm, S1 normal and S2 normal.   Murmur heard.  Systolic murmur is present with a grade of 1/6  Pulses:      Radial pulses are 2+ on the right side, and 2+ on the left side.       Dorsalis pedis pulses are 2+ on the right side, and 2+ on the left side.    Respiratory: Effort normal and breath sounds normal. He has no wheezes. He has no rales.  GI: Soft. Bowel sounds are normal. He exhibits no distension. There is no tenderness.  Musculoskeletal: He exhibits no edema.  Lymphadenopathy:    He has no cervical adenopathy.  Neurological: He is alert and oriented to person, place, and time.  Skin: Skin is warm and dry.  Psychiatric: He has a normal mood and affect.     Assessment/Plan Principal Problem:   Unstable angina Active Problems:   Ischemic cardiomyopathy, EF 35% 05/2011   DM type 2 (diabetes mellitus, type 2)   Essential hypertension   Coronary atherosclerosis   NSVT (nonsustained ventricular tachycardia)  74 yo male with a history of CABGx5 in 1996 by Dr. Servando Snare, DM, HTN, GERD, kidney stones.    Last echo 05/2011 with EF 35%(difficult to estimate)-akinesis of the entire apex, with slight dyskinesis.  He presents today with chest pain which started yesterday. 5/10.  Feels like pressure, like "a dog sitting on your chest." Some periodic shooting pains.  Sounds like unstable angina for the most part.  Troponin is negative.  Graphs are 74yrs old.  Adding IV heparin, NTG.  He is having NSVT-5-6beats seen on tele.  HR 52 which will limit beta blocker.  I think a heart cath is the most appropriate choice here.  He has not eaten since 0800hrs.  Echo.      Tarri Fuller, [facility] 01/03/2015, 1:59 PM    The patient was seen, examined and discussed with Brittainy M. Rosita Fire, PA-C and I agree with the above.   74 year old male with known  CABG x 5 in 1996, followed in the clinic by Dr Johnsie Cancel, last seen in 2012. The patient presented with retrosternal chest pain - ongoing, 5/10, pressure like. The patient didn't use nitroglycerin, he was working harder than usual 2 days ago in the yard and woke up with this pain yesterday. He was waiting till today to see if it resolves.   ECG is showing inferolateral T wave inversion in the anterolateral leads,  similar to the old ECG, new negative T wave in V2.  We will start Heparin drip and NTG drip for unstable angina. We will continue simvastatin, aspirin, valsartan. NTG drip for hypertension.   We will schedule for cath on Monday.   Dorothy Spark 01/03/2015

## 2015-01-03 NOTE — ED Notes (Signed)
Pt presents to department for evaluation of midsternal non radiating chest pain, onset yesterday. Denies SOB. History of bypass. 5/10 pain upon arrival to ED. Pt is alert and oriented x4.

## 2015-01-03 NOTE — Progress Notes (Addendum)
ANTICOAGULATION CONSULT NOTE - Initial Consult  Pharmacy Consult for Heparin Indication: chest pain/ACS  No Known Allergies  Patient Measurements: Height: 5\' 9"  (175.3 cm) Weight: 216 lb (97.977 kg) IBW/kg (Calculated) : 70.7   Vital Signs: Temp: 98.3 F (36.8 C) (04/08 1044) Temp Source: Oral (04/08 1044) BP: 154/65 mmHg (04/08 1400) Pulse Rate: 57 (04/08 1400)  Labs:  Recent Labs  01/03/15 1206  HGB 14.2  HCT 40.3  PLT 107*  CREATININE 0.76    Estimated Creatinine Clearance: 94.9 mL/min (by C-G formula based on Cr of 0.76).   Medical History: Past Medical History  Diagnosis Date  . Atherosclerosis of native arteries of the extremities with intermittent claudication   . Diabetes mellitus   . Hypertension   . CAD (coronary artery disease)   . Myocardial infarction 1996  . Arterial occlusive disease   . GERD (gastroesophageal reflux disease)   . Hard of hearing     has hearing aids /BIL    Medications:   (Not in a hospital admission)  Assessment: 74 yo M admitted 01/03/2015 with CP.  Pharmacy consulted to start heparin  PMH: CAD (CABG x5 1996), HF EF 35%, DM, HTN, GERD  Coag: ACS to start heparin.  Patient denies recent bleeding. Note baselines platelets low, follow closely   Goal of Therapy:  Heparin level 0.3-0.7 units/ml Monitor platelets by anticoagulation protocol: Yes   Plan:  Heparin 1100 units/hr Heparin 4000 units x 1 Xa concentration in 6 hours Daily Xa level and CBC  Thank you for allowing pharmacy to be a part of this patients care team.  Rowe Robert Pharm.D., BCPS, AQ-Cardiology Clinical Pharmacist 01/03/2015 2:37 PM Pager: [phone] Phone: [phone]  Addendum  HL came back at 0.2 tonight.   Plan   Increase heparin to 1350 units/hr F/u with AM level  Onnie Boer, PharmD Pager: [phone] 01/03/2015 10:39 PM

## 2015-01-04 ENCOUNTER — Other Ambulatory Visit: Payer: Self-pay

## 2015-01-04 DIAGNOSIS — R079 Chest pain, unspecified: Secondary | ICD-10-CM

## 2015-01-04 LAB — CBC
HCT: 35.5 % — ABNORMAL LOW (ref 39.0–52.0)
Hemoglobin: 12.1 g/dL — ABNORMAL LOW (ref 13.0–17.0)
MCH: 30.3 pg (ref 26.0–34.0)
MCHC: 34.1 g/dL (ref 30.0–36.0)
MCV: 89 fL (ref 78.0–100.0)
Platelets: 99 10*3/uL — ABNORMAL LOW (ref 150–400)
RBC: 3.99 MIL/uL — ABNORMAL LOW (ref 4.22–5.81)
RDW: 13.3 % (ref 11.5–15.5)
WBC: 5.5 10*3/uL (ref 4.0–10.5)

## 2015-01-04 LAB — HEPARIN LEVEL (UNFRACTIONATED)
Heparin Unfractionated: 0.23 IU/mL — ABNORMAL LOW (ref 0.30–0.70)
Heparin Unfractionated: 0.45 IU/mL (ref 0.30–0.70)
Heparin Unfractionated: 0.35 IU/mL (ref 0.30–0.70)

## 2015-01-04 LAB — LIPID PANEL
Cholesterol: 111 mg/dL (ref 0–200)
HDL: 28 mg/dL — ABNORMAL LOW (ref >39)
LDL Cholesterol: 65 mg/dL (ref 0–99)
Total CHOL/HDL Ratio: 4 RATIO
Triglycerides: 91 mg/dL (ref <150)
VLDL: 18 mg/dL (ref 0–40)

## 2015-01-04 LAB — BASIC METABOLIC PANEL
Anion gap: 11 (ref 5–15)
BUN: 14 mg/dL (ref 6–23)
CO2: 22 mmol/L (ref 19–32)
Calcium: 9 mg/dL (ref 8.4–10.5)
Chloride: 105 mmol/L (ref 96–112)
Creatinine, Ser: 0.82 mg/dL (ref 0.50–1.35)
GFR calc Af Amer: >90 mL/min (ref >90)
GFR calc non Af Amer: 86 mL/min — ABNORMAL LOW (ref >90)
Glucose, Bld: 168 mg/dL — ABNORMAL HIGH (ref 70–99)
Potassium: 3.8 mmol/L (ref 3.5–5.1)
Sodium: 138 mmol/L (ref 135–145)

## 2015-01-04 LAB — GLUCOSE, CAPILLARY
Glucose-Capillary: 259 mg/dL — ABNORMAL HIGH (ref 70–99)
Glucose-Capillary: 191 mg/dL — ABNORMAL HIGH (ref 70–99)
Glucose-Capillary: 250 mg/dL — ABNORMAL HIGH (ref 70–99)
Glucose-Capillary: 288 mg/dL — ABNORMAL HIGH (ref 70–99)

## 2015-01-04 LAB — TROPONIN I
Troponin I: 0.03 ng/mL (ref <0.031)
Troponin I: 0.03 ng/mL (ref <0.031)

## 2015-01-04 MED ORDER — SODIUM CHLORIDE 0.9 % IV SOLN: 250.0000 mL | INTRAVENOUS | Status: DC | PRN

## 2015-01-04 MED ORDER — PERFLUTREN LIPID MICROSPHERE
1.0000 mL | INTRAVENOUS | Status: AC | PRN
  Administered 2015-01-04: 4 mL via INTRAVENOUS
  Filled 2015-01-04: qty 10

## 2015-01-04 MED ORDER — SODIUM CHLORIDE 0.9 % IJ SOLN
3.0000 mL | Freq: Two times a day (BID) | INTRAMUSCULAR | Status: DC
  Administered 2015-01-04 – 2015-01-05 (×3): 3 mL via INTRAVENOUS

## 2015-01-04 NOTE — Progress Notes (Signed)
  Echocardiogram 2D Echocardiogram has been performed.  Lysle Rubens 01/04/2015, 2:05 PM

## 2015-01-04 NOTE — Progress Notes (Signed)
Patient ID: Troy [name], male   DOB: 1941-04-08, 74 y.o.   MRN: [id number]    Subjective:  Denies SSCP, palpitations or Dyspnea Indicates that Dr Lovena Le evaluated him in 2012 and said no AICD needed   Objective:  Filed Vitals:   01/04/15 0012 01/04/15 0400 01/04/15 0830 01/04/15 1224  BP: 120/54 102/76 128/52 129/54  Pulse: 56 55 63 59  Temp: 99 F (37.2 C) 98.4 F (36.9 C) 98.3 F (36.8 C) 98.5 F (36.9 C)  TempSrc: Oral Oral Oral Oral  Resp: 13 12 16 18   Height:  5\' 9"  (1.753 m)    Weight:  214 lb 8 oz (97.297 kg)    SpO2: 99% 96% 96% 96%    Intake/Output from previous day:  Intake/Output Summary (Last 24 hours) at 01/04/15 1246 Last data filed at 01/04/15 0900  Gross per 24 hour  Intake  877.5 ml  Output   1750 ml  Net -872.5 ml    Physical Exam: Affect appropriate Overweight white male  HEENT: normal Neck supple with no adenopathy JVP normal no bruits no thyromegaly Lungs clear with no wheezing and good diaphragmatic motion Heart:  S1/S2 SEM  murmur, no rub, gallop or click  Previous sternotomy  PMI normal Abdomen: benighn, BS positve, no tenderness, no AAA no bruit.  No HSM or HJR Distal pulses intact with no bruits No edema Neuro non-focal Skin warm and dry No muscular weakness   Lab Results: Basic Metabolic Panel:  Recent Labs  01/03/15 1206 01/04/15 0118  NA 138 138  K 4.0 3.8  CL 107 105  CO2 26 22  GLUCOSE 225* 168*  BUN 10 14  CREATININE 0.76 0.82  CALCIUM 9.3 9.0   Liver Function Tests:  Recent Labs  01/03/15 1206  AST 39*  ALT 32  ALKPHOS 49  BILITOT 1.0  PROT 6.9  ALBUMIN 3.8   CBC:  Recent Labs  01/03/15 1206 01/04/15 0118  WBC 5.8 5.5  NEUTROABS 4.0  --   HGB 14.2 12.1*  HCT 40.3 35.5*  MCV 86.5 89.0  PLT 107* 99*   Cardiac Enzymes:  Recent Labs  01/03/15 1934 01/04/15 0118 01/04/15 0823  TROPONINI 0.03 0.03 0.03   Fasting Lipid Panel:  Recent Labs  01/04/15 0118  CHOL 111  HDL 28*  LDLCALC  65  TRIG 91  CHOLHDL 4.0   Thyroid Function Tests:  Recent Labs  01/03/15 1934  TSH 1.004    Imaging: Dg Chest 2 View  01/03/2015   CLINICAL DATA:  Sternal chest pain for 2 days.  Initial encounter.  EXAM: CHEST  2 VIEW  COMPARISON:  Abdominal CT 10/10/2014.  FINDINGS: The heart size and mediastinal contours are normal status post median sternotomy and CABG. One of the sternal wires is fractured. Mild linear atelectasis or scarring at the left lung base and mild central airway thickening are present. There is no edema, confluent airspace opacity or pleural effusion. No acute osseous findings are seen. The sternum demonstrates no acute findings.  IMPRESSION: No active cardiopulmonary process status post CABG.   Electronically Signed   By: Richardean Sale M.D.   On: 01/03/2015 12:10    Cardiac Studies:  ECG:    Telemetry: NSR no VT 01/04/2015   Echo:  Pending previous EF 30-35%   Medications:   . aspirin EC  81 mg Oral Daily  . glimepiride  2 mg Oral QAC breakfast  . insulin aspart  0-9 Units Subcutaneous TID WC  .  irbesartan  37.5 mg Oral Daily  . simvastatin  20 mg Oral QPM  . zolpidem  5 mg Oral QHS     . heparin 1,350 Units/hr (01/04/15 0832)  . nitroGLYCERIN 20 mcg/min (01/03/15 2206)    Assessment/Plan:  Chest Pain:  Distant history of CABG 1996  Not followed by cardiology recently [facility] as primary.  R/O no acute ECG changes On heparin and nitro Cath on Monday  RFA vs left radial No CABG report in Epic will try to see if Anderson Malta can find in am Monday Discussed with patient and family willing to  Proceed  DM:  Hold glimepride in am prior to cath   Chol:  On statin   Jenkins Rouge 01/04/2015, 12:46 PM

## 2015-01-04 NOTE — Progress Notes (Signed)
ANTICOAGULATION CONSULT NOTE - Follow Up Consult  Pharmacy Consult for heparin Indication: chest pain/ACS  No Known Allergies  Patient Measurements: Height: 5\' 9"  (175.3 cm) Weight: 214 lb 8 oz (97.297 kg) IBW/kg (Calculated) : 70.7 Heparin Dosing Weight: 90.7kg  Vital Signs: Temp: 97.5 F (36.4 C) (04/09 1700) Temp Source: Oral (04/09 1700) BP: 148/74 mmHg (04/09 1700) Pulse Rate: 58 (04/09 1700)  Labs:  Recent Labs  01/03/15 1206 01/03/15 1934  01/04/15 0013 01/04/15 0118 01/04/15 0823 01/04/15 1637  HGB 14.2  --   --   --  12.1*  --   --   HCT 40.3  --   --   --  35.5*  --   --   PLT 107*  --   --   --  99*  --   --   HEPARINUNFRC  --   --   < > 0.23*  --  0.45 0.35  CREATININE 0.76  --   --   --  0.82  --   --   TROPONINI  --  0.03  --   --  0.03 0.03  --   < > = values in this interval not displayed.  Estimated Creatinine Clearance: 92.3 mL/min (by C-G formula based on Cr of 0.82).   Medications:  Scheduled:  . aspirin EC  81 mg Oral Daily  . glimepiride  2 mg Oral QAC breakfast  . insulin aspart  0-9 Units Subcutaneous TID WC  . irbesartan  37.5 mg Oral Daily  . simvastatin  20 mg Oral QPM  . sodium chloride  3 mL Intravenous Q12H  . zolpidem  5 mg Oral QHS   Infusions:  . heparin 1,350 Units/hr (01/04/15 0832)  . nitroGLYCERIN 20 mcg/min (01/03/15 2206)    Assessment: 74 yo male with CP on heparin and heparin level noted at goal (HL = 0.45). Noted plans for cath on Monday.  Confirmatory HL remains therapeutic at 0.35 on heparin 1350 units/hr but is trending down. Will slightly increase to remain therapeutic. No bleeding or issues with heparin infusion per nurse.  Goal of Therapy:  Heparin level 0.3-0.7 units/ml Monitor platelets by anticoagulation protocol: Yes   Plan:  Increase heparin 1450 units/hr 8h HL Daily HL/CBC  Andrey Cota. Diona Foley, PharmD Clinical Pharmacist Pager [phone] 01/04/2015 6:32 PM

## 2015-01-04 NOTE — Progress Notes (Signed)
Utilization review complete 

## 2015-01-04 NOTE — Progress Notes (Signed)
ANTICOAGULATION CONSULT NOTE - Follow Up Consult  Pharmacy Consult for heparin Indication: chest pain/ACS  No Known Allergies  Patient Measurements: Height: 5\' 9"  (175.3 cm) Weight: 214 lb 8 oz (97.297 kg) IBW/kg (Calculated) : 70.7 Heparin Dosing Weight: 90.7kg  Vital Signs: Temp: 98.3 F (36.8 C) (04/09 0830) Temp Source: Oral (04/09 0830) BP: 102/76 mmHg (04/09 0400) Pulse Rate: 63 (04/09 0830)  Labs:  Recent Labs  01/03/15 1206 01/03/15 1934 01/03/15 2034 01/04/15 0013 01/04/15 0118 01/04/15 0823  HGB 14.2  --   --   --  12.1*  --   HCT 40.3  --   --   --  35.5*  --   PLT 107*  --   --   --  99*  --   HEPARINUNFRC  --   --  0.20* 0.23*  --  0.45  CREATININE 0.76  --   --   --  0.82  --   TROPONINI  --  0.03  --   --  0.03 0.03    Estimated Creatinine Clearance: 92.3 mL/min (by C-G formula based on Cr of 0.82).   Medications:  Scheduled:  . aspirin EC  81 mg Oral Daily  . glimepiride  2 mg Oral QAC breakfast  . insulin aspart  0-9 Units Subcutaneous TID WC  . irbesartan  37.5 mg Oral Daily  . simvastatin  20 mg Oral QPM  . zolpidem  5 mg Oral QHS   Infusions:  . heparin 1,350 Units/hr (01/04/15 0832)  . nitroGLYCERIN 20 mcg/min (01/03/15 2206)    Assessment: 74 yo male with CP on heparin and heparin level noted at goal (HL = 0.45). Noted plans for cath on Monday.  Goal of Therapy:  Heparin level 0.3-0.7 units/ml Monitor platelets by anticoagulation protocol: Yes   Plan:  -No heparin changes needed -Will confirm a heparin level later today  Hildred Laser, Pharm D 01/04/2015 9:32 AM

## 2015-01-05 LAB — CBC
HCT: 34.5 % — ABNORMAL LOW (ref 39.0–52.0)
Hemoglobin: 12 g/dL — ABNORMAL LOW (ref 13.0–17.0)
MCH: 30.1 pg (ref 26.0–34.0)
MCHC: 34.8 g/dL (ref 30.0–36.0)
MCV: 86.5 fL (ref 78.0–100.0)
Platelets: 81 10*3/uL — ABNORMAL LOW (ref 150–400)
RBC: 3.99 MIL/uL — ABNORMAL LOW (ref 4.22–5.81)
RDW: 13.1 % (ref 11.5–15.5)
WBC: 5.5 10*3/uL (ref 4.0–10.5)

## 2015-01-05 LAB — PROTIME-INR
INR: 1.06 (ref 0.00–1.49)
Prothrombin Time: 13.9 seconds (ref 11.6–15.2)

## 2015-01-05 LAB — OCCULT BLOOD X 1 CARD TO LAB, STOOL: Fecal Occult Bld: NEGATIVE

## 2015-01-05 LAB — HEPARIN LEVEL (UNFRACTIONATED): Heparin Unfractionated: 0.53 IU/mL (ref 0.30–0.70)

## 2015-01-05 LAB — GLUCOSE, CAPILLARY
Glucose-Capillary: 221 mg/dL — ABNORMAL HIGH (ref 70–99)
Glucose-Capillary: 197 mg/dL — ABNORMAL HIGH (ref 70–99)

## 2015-01-05 MED ORDER — MAGNESIUM HYDROXIDE 400 MG/5ML PO SUSP
5.0000 mL | Freq: Every day | ORAL | Status: DC | PRN
  Administered 2015-01-05: 5 mL via ORAL
  Filled 2015-01-05 (×3): qty 30

## 2015-01-05 MED ORDER — ASPIRIN 81 MG PO CHEW
81.0000 mg | CHEWABLE_TABLET | ORAL | Status: AC
  Administered 2015-01-06: 81 mg via ORAL

## 2015-01-05 MED ORDER — SODIUM CHLORIDE 0.9 % IJ SOLN: 3.0000 mL | INTRAMUSCULAR | Status: DC | PRN

## 2015-01-05 NOTE — Progress Notes (Signed)
ANTICOAGULATION CONSULT NOTE - Follow Up Consult  Pharmacy Consult for heparin Indication: chest pain/ACS  No Known Allergies  Patient Measurements: Height: 5\' 9"  (175.3 cm) Weight: 219 lb 14.4 oz (99.746 kg) IBW/kg (Calculated) : 70.7 Heparin Dosing Weight: 90.7kg  Vital Signs: Temp: 98.1 F (36.7 C) (04/10 1200) Temp Source: Oral (04/10 1200) BP: 147/50 mmHg (04/10 1200) Pulse Rate: 57 (04/10 1200)  Labs:  Recent Labs  01/03/15 1206 01/03/15 1934  01/04/15 0118 01/04/15 0823 01/04/15 1637 01/05/15 0500 01/05/15 0630  HGB 14.2  --   --  12.1*  --   --  12.0*  --   HCT 40.3  --   --  35.5*  --   --  34.5*  --   PLT 107*  --   --  99*  --   --  81*  --   LABPROT  --   --   --   --   --   --   --  13.9  INR  --   --   --   --   --   --   --  1.06  HEPARINUNFRC  --   --   < >  --  0.45 0.35  --  0.53  CREATININE 0.76  --   --  0.82  --   --   --   --   TROPONINI  --  0.03  --  0.03 0.03  --   --   --   < > = values in this interval not displayed.  Estimated Creatinine Clearance: 93.4 mL/min (by C-G formula based on Cr of 0.82).   Medications:  Scheduled:  . aspirin EC  81 mg Oral Daily  . glimepiride  2 mg Oral QAC breakfast  . insulin aspart  0-9 Units Subcutaneous TID WC  . irbesartan  37.5 mg Oral Daily  . simvastatin  20 mg Oral QPM  . sodium chloride  3 mL Intravenous Q12H  . zolpidem  5 mg Oral QHS   Infusions:  . heparin 1,450 Units/hr (01/05/15 0333)  . nitroGLYCERIN 20 mcg/min (01/05/15 0817)    Assessment: 74 yo male with CP on heparin and heparin level noted at goal (HL = 0.53). Noted plans for cath on Monday.  Heparin level 0.3-0.7 units/ml Monitor platelets by anticoagulation protocol: Yes   Plan:  -No heparin changes needed -Daily CBC and heparin level  Hildred Laser, Pharm D 01/05/2015 1:03 PM

## 2015-01-05 NOTE — Progress Notes (Addendum)
Patient ID: MUHANNAD BIGNELL, male   DOB: Jun 16, 1941, 74 y.o.   MRN: [id number]    Subjective:  Denies SSCP, palpitations or Dyspnea Indicates that Dr Lovena Le evaluated him in 2012 and said no AICD needed  Gets nausea and low BS at night eats crackers has them at bedside   Objective:  Filed Vitals:   01/04/15 2000 01/05/15 0000 01/05/15 0425 01/05/15 0500  BP: 142/53 113/44 134/47   Pulse: 55 50 47   Temp: 97.8 F (36.6 C) 98 F (36.7 C) 98.3 F (36.8 C)   TempSrc:      Resp: 16 15 15    Height:      Weight:    219 lb 14.4 oz (99.746 kg)  SpO2: 99% 97% 95%     Intake/Output from previous day:  Intake/Output Summary (Last 24 hours) at 01/05/15 0915 Last data filed at 01/05/15 0800  Gross per 24 hour  Intake   1204 ml  Output   2625 ml  Net  -1421 ml    Physical Exam: Affect appropriate Overweight white male  HEENT: normal Neck supple with no adenopathy JVP normal no bruits no thyromegaly Lungs clear with no wheezing and good diaphragmatic motion Heart:  S1/S2 SEM  murmur, no rub, gallop or click  Previous sternotomy  PMI normal Abdomen: benighn, BS positve, no tenderness, no AAA no bruit.  No HSM or HJR Distal pulses intact with no bruits No edema Neuro non-focal Skin warm and dry No muscular weakness   Lab Results: Basic Metabolic Panel:  Recent Labs  01/03/15 1206 01/04/15 0118  NA 138 138  K 4.0 3.8  CL 107 105  CO2 26 22  GLUCOSE 225* 168*  BUN 10 14  CREATININE 0.76 0.82  CALCIUM 9.3 9.0   Liver Function Tests:  Recent Labs  01/03/15 1206  AST 39*  ALT 32  ALKPHOS 49  BILITOT 1.0  PROT 6.9  ALBUMIN 3.8   CBC:  Recent Labs  01/03/15 1206 01/04/15 0118 01/05/15 0500  WBC 5.8 5.5 5.5  NEUTROABS 4.0  --   --   HGB 14.2 12.1* 12.0*  HCT 40.3 35.5* 34.5*  MCV 86.5 89.0 86.5  PLT 107* 99* 81*   Cardiac Enzymes:  Recent Labs  01/03/15 1934 01/04/15 0118 01/04/15 0823  TROPONINI 0.03 0.03 0.03   Fasting Lipid  Panel:  Recent Labs  01/04/15 0118  CHOL 111  HDL 28*  LDLCALC 65  TRIG 91  CHOLHDL 4.0   Thyroid Function Tests:  Recent Labs  01/03/15 1934  TSH 1.004    Imaging: Dg Chest 2 View  01/03/2015   CLINICAL DATA:  Sternal chest pain for 2 days.  Initial encounter.  EXAM: CHEST  2 VIEW  COMPARISON:  Abdominal CT 10/10/2014.  FINDINGS: The heart size and mediastinal contours are normal status post median sternotomy and CABG. One of the sternal wires is fractured. Mild linear atelectasis or scarring at the left lung base and mild central airway thickening are present. There is no edema, confluent airspace opacity or pleural effusion. No acute osseous findings are seen. The sternum demonstrates no acute findings.  IMPRESSION: No active cardiopulmonary process status post CABG.   Electronically Signed   By: Richardean Sale M.D.   On: 01/03/2015 12:10    Cardiac Studies:  ECG:  SR worrisome anterolateral T wave changes with J point elevation    Telemetry: NSR no VT 01/05/2015   Echo:  Pending previous EF 30-35%   Medications:   .  aspirin EC  81 mg Oral Daily  . glimepiride  2 mg Oral QAC breakfast  . insulin aspart  0-9 Units Subcutaneous TID WC  . irbesartan  37.5 mg Oral Daily  . simvastatin  20 mg Oral QPM  . sodium chloride  3 mL Intravenous Q12H  . zolpidem  5 mg Oral QHS     . heparin 1,450 Units/hr (01/05/15 0333)  . nitroGLYCERIN 20 mcg/min (01/05/15 0817)    Assessment/Plan:  Chest Pain:  Distant history of CABG 1996  Not followed by cardiology recently [facility] as primary.  R/O ECG worrisome despite negative troponin  On heparin and nitroCath on Monday  RFA vs left radial No CABG report in Epic will try to see if Anderson Malta can find in am Monday Discussed with patient and family willing to  Proceed  DM:  Hold glimepride in am prior to cath   Chol:  On statin   Hct:  Falling since admission guaiac stools    Jenkins Rouge 01/05/2015, 9:15 AM

## 2015-01-06 ENCOUNTER — Encounter (HOSPITAL_COMMUNITY): Payer: Self-pay | Admitting: Cardiovascular Disease

## 2015-01-06 ENCOUNTER — Encounter (HOSPITAL_COMMUNITY)
Admission: EM | Disposition: A | Discharge status: Home / Self Care | Payer: Self-pay | Source: Home / Self Care | Attending: Cardiovascular Disease

## 2015-01-06 DIAGNOSIS — E119 Type 2 diabetes mellitus without complications: Secondary | ICD-10-CM

## 2015-01-06 HISTORY — PX: LEFT HEART CATHETERIZATION WITH CORONARY/GRAFT ANGIOGRAM: SHX5450

## 2015-01-06 LAB — CBC
HCT: 36.4 % — ABNORMAL LOW (ref 39.0–52.0)
HCT: 37.6 % — ABNORMAL LOW (ref 39.0–52.0)
Hemoglobin: 12.5 g/dL — ABNORMAL LOW (ref 13.0–17.0)
Hemoglobin: 12.8 g/dL — ABNORMAL LOW (ref 13.0–17.0)
MCH: 30.2 pg (ref 26.0–34.0)
MCH: 30.3 pg (ref 26.0–34.0)
MCHC: 34.3 g/dL (ref 30.0–36.0)
MCHC: 34 g/dL (ref 30.0–36.0)
MCV: 87.9 fL (ref 78.0–100.0)
MCV: 88.9 fL (ref 78.0–100.0)
Platelets: 81 10*3/uL — ABNORMAL LOW (ref 150–400)
Platelets: 91 10*3/uL — ABNORMAL LOW (ref 150–400)
RBC: 4.14 MIL/uL — ABNORMAL LOW (ref 4.22–5.81)
RBC: 4.23 MIL/uL (ref 4.22–5.81)
RDW: 13.5 % (ref 11.5–15.5)
RDW: 13.7 % (ref 11.5–15.5)
WBC: 5.5 10*3/uL (ref 4.0–10.5)
WBC: 6 10*3/uL (ref 4.0–10.5)

## 2015-01-06 LAB — CREATININE, SERUM
Creatinine, Ser: 0.69 mg/dL (ref 0.50–1.35)
GFR calc Af Amer: >90 mL/min (ref >90)
GFR calc non Af Amer: >90 mL/min (ref >90)

## 2015-01-06 LAB — GLUCOSE, CAPILLARY
Glucose-Capillary: 182 mg/dL — ABNORMAL HIGH (ref 70–99)
Glucose-Capillary: 238 mg/dL — ABNORMAL HIGH (ref 70–99)
Glucose-Capillary: 220 mg/dL — ABNORMAL HIGH (ref 70–99)
Glucose-Capillary: 189 mg/dL — ABNORMAL HIGH (ref 70–99)
Glucose-Capillary: 116 mg/dL — ABNORMAL HIGH (ref 70–99)
Glucose-Capillary: 153 mg/dL — ABNORMAL HIGH (ref 70–99)
Glucose-Capillary: 219 mg/dL — ABNORMAL HIGH (ref 70–99)

## 2015-01-06 LAB — HEPARIN LEVEL (UNFRACTIONATED): Heparin Unfractionated: 0.47 IU/mL (ref 0.30–0.70)

## 2015-01-06 LAB — HEMOGLOBIN A1C
Hgb A1c MFr Bld: 8.2 % — ABNORMAL HIGH (ref 4.8–5.6)
Mean Plasma Glucose: 189 mg/dL

## 2015-01-06 LAB — POCT ACTIVATED CLOTTING TIME: Activated Clotting Time: 91 seconds

## 2015-01-06 SURGERY — LEFT HEART CATHETERIZATION WITH CORONARY/GRAFT ANGIOGRAM
Anesthesia: LOCAL

## 2015-01-06 MED ORDER — METOPROLOL TARTRATE 25 MG PO TABS
25.0000 mg | ORAL_TABLET | Freq: Two times a day (BID) | ORAL | Status: DC
  Administered 2015-01-06: 25 mg via ORAL
  Filled 2015-01-06 (×2): qty 1

## 2015-01-06 MED ORDER — FENTANYL CITRATE 0.05 MG/ML IJ SOLN
INTRAMUSCULAR | Status: AC
  Filled 2015-01-06: qty 2

## 2015-01-06 MED ORDER — ISOSORBIDE MONONITRATE ER 30 MG PO TB24
30.0000 mg | ORAL_TABLET | Freq: Every day | ORAL | Status: DC
  Administered 2015-01-06 – 2015-01-07 (×2): 30 mg via ORAL
  Filled 2015-01-06 (×2): qty 1

## 2015-01-06 MED ORDER — ASPIRIN EC 81 MG PO TBEC: 81.0000 mg | DELAYED_RELEASE_TABLET | Freq: Every day | ORAL | Status: DC

## 2015-01-06 MED ORDER — ATORVASTATIN CALCIUM 80 MG PO TABS
80.0000 mg | ORAL_TABLET | Freq: Every day | ORAL | Status: DC
  Filled 2015-01-06: qty 1

## 2015-01-06 MED ORDER — SODIUM CHLORIDE 0.9 % IV SOLN
INTRAVENOUS | Status: DC
  Administered 2015-01-06 – 2015-01-07 (×2): via INTRAVENOUS

## 2015-01-06 MED ORDER — HEPARIN SODIUM (PORCINE) 5000 UNIT/ML IJ SOLN: 5000.0000 [IU] | Freq: Three times a day (TID) | INTRAMUSCULAR | Status: DC

## 2015-01-06 MED ORDER — INSULIN ASPART 100 UNIT/ML ~~LOC~~ SOLN
0.0000 [IU] | Freq: Three times a day (TID) | SUBCUTANEOUS | Status: DC
  Administered 2015-01-06 – 2015-01-07 (×2): 5 [IU] via SUBCUTANEOUS
  Administered 2015-01-07: 3 [IU] via SUBCUTANEOUS

## 2015-01-06 MED ORDER — ACETAMINOPHEN 325 MG PO TABS: 650.0000 mg | ORAL_TABLET | ORAL | Status: DC | PRN

## 2015-01-06 MED ORDER — ONDANSETRON HCL 4 MG/2ML IJ SOLN: 4.0000 mg | Freq: Four times a day (QID) | INTRAMUSCULAR | Status: DC | PRN

## 2015-01-06 MED ORDER — MIDAZOLAM HCL 2 MG/2ML IJ SOLN
INTRAMUSCULAR | Status: AC
  Filled 2015-01-06: qty 2

## 2015-01-06 MED ORDER — LIDOCAINE HCL (PF) 1 % IJ SOLN
INTRAMUSCULAR | Status: AC
  Filled 2015-01-06: qty 30

## 2015-01-06 MED ORDER — HEPARIN (PORCINE) IN NACL 2-0.9 UNIT/ML-% IJ SOLN
INTRAMUSCULAR | Status: AC
  Filled 2015-01-06: qty 1000

## 2015-01-06 NOTE — CV Procedure (Signed)
Troy [name] is a 74 y.o. male    [id number]  [id number] LOCATION:  FACILITY: [place]  PHYSICIAN: Troy Sine, MD, [facility] 12/28/1940   DATE OF PROCEDURE:  01/06/2015    CARDIAC CATHETERIZATION     HISTORY:    Troy [name] is a 74 y.o. male who underwent CABG revascularization surgery 5 by Dr. Servando Snare in 1996. 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 He is admitted with recurrent episodes of chest pain worrisome for unstable angina. He is referred for cardiac catheterization.   PROCEDURE: Left heart catheterization: coronary angiography the native coronary arteries, selective angiography into the saphenous vein grafts, and selective angiography into the left internal mammary artery, left ventriculography   The patient was brought to the [facility] cardiac catherization laboratory in the fasting state. He  was premedicated with Versed 2mg  and fentanyl 50 g.. His right groin was prepped and shaved in usual sterile fashion. Xylocaine 1% was used for local anesthesia. A 5 French sheath was inserted into the R femoral artery. Diagnostic catheterizatiion was done with 5 [place] FL4, FR4, LIMA and pigtail catheters. Left ventriculography was done with 25 cc Omnipaque contrast. Hemostasis was obtained by direct manual compression. The patient tolerated the procedure well.   HEMODYNAMICS:   Central Aorta: 170/59   Left Ventricle: 170/19  ANGIOGRAPHY:  Left main: Normal vessel which bifurcated into the LAD and left circumflex coronary artery  LAD: Moderate size proximal vessel that was totally occluded after the first septal and diagonal vessel.  Left circumflex: good size vessel that had 70% mid stenosis before a small marginal branch. The distal vessel was small caliber and there was retrograde filling of a previous sequential graft most likely between the small marginal and distal circumflex.  Right coronary artery:  Moderate size vessel with calcification and 80% proximal stenosis with a small area of probable thrombus in its midsegment prior to total mid occlusion.  LIMA to LAD: Widely patent and anastomosed into the mid LAD. There is retrograde filling to the proximal occlusion. The anastomosis was free of significant disease extended to the apex.  SVG  sequential graft supplying a marginal and distal circumflex was totally occluded at its origin.  SVG E high marginal or ramus intermediate vessel was widely patent. There was mild 20% proximal smooth narrowing.   Left ventriculography revealed global  LV dysfunction with an ejection fraction of 35 to less than 40%.   IMPRESSION:  Ischemic cardiomyopathy with an ejection fraction of 35 to less than 40% with diffuse global hypocontractility.  Significant native coronary obstructive disease with total occlusion of the proximal LAD after the septal diagonal vessel; 70% stenosis in the proximal AV groove circumflex with occlusion of the native OM branch and retrograde filling of the sequential limb distally to this mid branch and total occlusion of the mid right coronary artery with 80% calcified proximal stenosis and small area of thrombus in the mid RCA prior to the total occlusion.  Occluded vein graft which most likely was the sequential graft supplying an OM and distal circumflex vessel the circumflex coronary artery.  Patent SVG which seems to supply a high marginal or ramus intermediate-like vessel this 20% proximal narrowing in the graft.  Patent SVG to the PDA branch of the RCA.  Patent LIMA graft to the mid LAD.  RECOMMENDATION:  The patient has an occluded graft which most likely sequentially supplied a small OM and distal circumflex  vessel. 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 Platelets today are low at 81,000. He has not been on any significant antianginal regimen. Initial medical therapy will be recommended with close follow-up  of his platelets. If recurrent symptomatology develops consider possible PCI to the native circumflex    Troy Sine, MD, [facility] 01/06/2015 5:28 PM

## 2015-01-06 NOTE — Interval H&P Note (Signed)
Cath Lab Visit (complete for each Cath Lab visit)  Clinical Evaluation Leading to the Procedure:   ACS: No.  Non-ACS:    Anginal Classification: CCS III  Anti-ischemic medical therapy: Maximal Therapy (2 or more classes of medications)  Non-Invasive Test Results: No non-invasive testing performed  Prior CABG: Previous CABG      History and Physical Interval Note:  01/06/2015 3:47 PM  Troy [name]  has presented today for surgery, with the diagnosis of unstable angina  The various methods of treatment have been discussed with the patient and family. After consideration of risks, benefits and other options for treatment, the patient has consented to  Procedure(s): LEFT HEART CATHETERIZATION WITH CORONARY/GRAFT ANGIOGRAM (N/A) as a surgical intervention .  The patient's history has been reviewed, patient examined, no change in status, stable for surgery.  I have reviewed the patient's chart and labs.  Questions were answered to the patient's satisfaction.     Mekia Dipinto A

## 2015-01-06 NOTE — H&P (View-Only) (Signed)
Subjective: Maybe a little chest pressure.   Objective: Vital signs in last 24 hours: Temp:  [97.9 F (36.6 C)-98.4 F (36.9 C)] 97.9 F (36.6 C) (04/11 0741) Pulse Rate:  [47-66] 57 (04/11 0633) Resp:  [0-24] 15 (04/11 0633) BP: (125-147)/(38-63) 125/38 mmHg (04/11 0400) SpO2:  [93 %-98 %] 96 % (04/11 0633) Weight:  [219 lb 6.4 oz (99.519 kg)] 219 lb 6.4 oz (99.519 kg) (04/11 0400) Last BM Date: 01/05/15  Intake/Output from previous day: 04/10 0701 - 04/11 0700 In: 1346.3 [P.O.:840; I.V.:506.3] Out: 2850 [Urine:2850] Intake/Output this shift:    Medications Current Facility-Administered Medications  Medication Dose Route Frequency Provider Last Rate Last Dose  . 0.9 %  sodium chloride infusion  250 mL Intravenous PRN Josue Hector, MD      . acetaminophen (TYLENOL) tablet 650 mg  650 mg Oral Q4H PRN Brett Canales, PA-C      . aspirin EC tablet 81 mg  81 mg Oral Daily Brett Canales, PA-C   81 mg at 01/05/15 1002  . glimepiride (AMARYL) tablet 2 mg  2 mg Oral QAC breakfast Brett Canales, PA-C   Stopped at 01/06/15 0730  . heparin ADULT infusion 100 units/mL (25000 units/250 mL)  1,450 Units/hr Intravenous Continuous Rebecka Apley, RPH 14.5 mL/hr at 01/05/15 2307 1,450 Units/hr at 01/05/15 2307  . insulin aspart (novoLOG) injection 0-9 Units  0-9 Units Subcutaneous TID WC Brett Canales, PA-C   3 Units at 01/05/15 1836  . irbesartan (AVAPRO) tablet 37.5 mg  37.5 mg Oral Daily Brett Canales, PA-C   37.5 mg at 01/05/15 1001  . magnesium hydroxide (MILK OF MAGNESIA) suspension 5 mL  5 mL Oral Daily PRN Josue Hector, MD   5 mL at 01/05/15 1544  . nitroGLYCERIN (NITROSTAT) SL tablet 0.4 mg  0.4 mg Sublingual Q5 min PRN Blanchie Dessert, MD      . nitroGLYCERIN 50 mg in dextrose 5 % 250 mL (0.2 mg/mL) infusion  2-200 mcg/min Intravenous Titrated Brett Canales, PA-C 6 mL/hr at 01/05/15 0817 20 mcg/min at 01/05/15 0817  . ondansetron (ZOFRAN) injection 4 mg  4 mg Intravenous Q6H PRN  Brett Canales, PA-C      . simvastatin (ZOCOR) tablet 20 mg  20 mg Oral QPM Brett Canales, PA-C   20 mg at 01/05/15 1835  . sodium chloride 0.9 % injection 3 mL  3 mL Intravenous Q12H Josue Hector, MD   3 mL at 01/05/15 2304  . sodium chloride 0.9 % injection 3 mL  3 mL Intravenous PRN Josue Hector, MD      . zolpidem [facility]) tablet 5 mg  5 mg Oral QHS Brett Canales, PA-C   5 mg at 01/05/15 2303    PE: General appearance: alert, cooperative and no distress Lungs: clear to auscultation bilaterally Heart: regular rate and rhythm and 1/6 sys MM Extremities: No LEE Pulses: 2+ and symmetric Skin: Warm and dry Neurologic: Grossly normal  Lab Results:   Recent Labs  01/04/15 0118 01/05/15 0500 01/06/15 0531  WBC 5.5 5.5 5.5  HGB 12.1* 12.0* 12.5*  HCT 35.5* 34.5* 36.4*  PLT 99* 81* 81*   BMET  Recent Labs  01/03/15 1206 01/04/15 0118  NA 138 138  K 4.0 3.8  CL 107 105  CO2 26 22  GLUCOSE 225* 168*  BUN 10 14  CREATININE 0.76 0.82  CALCIUM 9.3 9.0   PT/INR  Recent  Labs  01/05/15 0630  LABPROT 13.9  INR 1.06   Cholesterol  Recent Labs  01/04/15 0118  CHOL 111   Lipid Panel     Component Value Date/Time   CHOL 111 01/04/2015 0118   TRIG 91 01/04/2015 0118   HDL 28* 01/04/2015 0118   CHOLHDL 4.0 01/04/2015 0118   VLDL 18 01/04/2015 0118   LDLCALC 65 01/04/2015 0118    Assessment/Plan 74 yo male with a history of CABGx5 in 1996 by Dr. Servando Snare, DM, HTN, GERD. He saw Dr. Lovena Le for consideration of an ICD in 2011. He has not been seen in the office since 05/2011. At that time he had LEA dopplers with right ABI 0.9 and left 1.15. Last nuclear stress test 2010. Last echo 05/2011 with EF 35%(difficlt to estimate)-akinesis of the entire apex, with slight dyskinesis. He presents today with chest pain      Unstable angina Ruled out for MI.  Hepain and NTG. CABGx5 in 1996 by Dr. Servando Snare.  Left heart cath 1030hrs today.      Ischemic cardiomyopathy, EF  35% 05/2011   Chronic sys HF  Euvolemic.    DM type 2 (diabetes mellitus, type 2)  Increase to moderate coverage.   Essential hypertension  Stable, controlled.   Coronary atherosclerosis   NSVT (nonsustained ventricular tachycardia) Questionable periodic NSVT in the last 12 hours.  Looks like artifact.  He did have a short run 5-6 beats in ER.    LOS: 3 days    HAGER, BRYAN PA-C 01/06/2015 7:51 AM  Patient with remote CABG 5 in 1996 by Dr. Servando Snare.  Presented on 01/03/15 with history of moderately severe chest pressure.  IV nitroglycerin and IV heparin were started on admission.  He has ruled out for myocardial infarction.  He has a past history of nonischemic cardiomyopathy with ejection fraction approximately 35% in 2012.  He indicates no chest discomfort at present.  Awaiting cardiac catheterization. Lungs are clear.  The heart reveals a soft systolic murmur at the base.  No diastolic murmur. Plan cardiac catheterization today.

## 2015-01-06 NOTE — Progress Notes (Signed)
ANTICOAGULATION CONSULT NOTE - Follow Up Consult  Pharmacy Consult for heparin Indication: chest pain/ACS  No Known Allergies  Patient Measurements: Height: 5\' 9"  (175.3 cm) Weight: 219 lb 6.4 oz (99.519 kg) IBW/kg (Calculated) : 70.7 Heparin Dosing Weight: 90.7kg  Vital Signs: Temp: 97.9 F (36.6 C) (04/11 0741) Temp Source: Oral (04/11 0741) BP: 125/38 mmHg (04/11 0400) Pulse Rate: 57 (04/11 0633)  Labs:  Recent Labs  01/03/15 1206 01/03/15 1934  01/04/15 0118 01/04/15 0823 01/04/15 1637 01/05/15 0500 01/05/15 0630 01/06/15 0531  HGB 14.2  --   --  12.1*  --   --  12.0*  --  12.5*  HCT 40.3  --   --  35.5*  --   --  34.5*  --  36.4*  PLT 107*  --   --  99*  --   --  81*  --  81*  LABPROT  --   --   --   --   --   --   --  13.9  --   INR  --   --   --   --   --   --   --  1.06  --   HEPARINUNFRC  --   --   < >  --  0.45 0.35  --  0.53 0.47  CREATININE 0.76  --   --  0.82  --   --   --   --   --   TROPONINI  --  0.03  --  0.03 0.03  --   --   --   --   < > = values in this interval not displayed.  Estimated Creatinine Clearance: 93.3 mL/min (by C-G formula based on Cr of 0.82).   Medications:  Scheduled:  . aspirin EC  81 mg Oral Daily  . glimepiride  2 mg Oral QAC breakfast  . insulin aspart  0-15 Units Subcutaneous TID WC  . irbesartan  37.5 mg Oral Daily  . simvastatin  20 mg Oral QPM  . sodium chloride  3 mL Intravenous Q12H  . zolpidem  5 mg Oral QHS   Infusions:  . heparin 1,450 Units/hr (01/05/15 2307)  . nitroGLYCERIN 20 mcg/min (01/05/15 0817)    Assessment: 74 yo male with CP on heparin and heparin level noted at goal (HL = 0.47). Noted plans for cath today.  Heparin level 0.3-0.7 units/ml Monitor platelets by anticoagulation protocol: Yes   Plan:  -No heparin changes needed -Daily CBC and heparin level  Hildred Laser, Pharm D 01/06/2015 8:55 AM

## 2015-01-06 NOTE — Progress Notes (Signed)
Subjective: Maybe a little chest pressure.   Objective: Vital signs in last 24 hours: Temp:  [97.9 F (36.6 C)-98.4 F (36.9 C)] 97.9 F (36.6 C) (04/11 0741) Pulse Rate:  [47-66] 57 (04/11 0633) Resp:  [0-24] 15 (04/11 0633) BP: (125-147)/(38-63) 125/38 mmHg (04/11 0400) SpO2:  [93 %-98 %] 96 % (04/11 0633) Weight:  [219 lb 6.4 oz (99.519 kg)] 219 lb 6.4 oz (99.519 kg) (04/11 0400) Last BM Date: 01/05/15  Intake/Output from previous day: 04/10 0701 - 04/11 0700 In: 1346.3 [P.O.:840; I.V.:506.3] Out: 2850 [Urine:2850] Intake/Output this shift:    Medications Current Facility-Administered Medications  Medication Dose Route Frequency Provider Last Rate Last Dose  . 0.9 %  sodium chloride infusion  250 mL Intravenous PRN Josue Hector, MD      . acetaminophen (TYLENOL) tablet 650 mg  650 mg Oral Q4H PRN Brett Canales, PA-C      . aspirin EC tablet 81 mg  81 mg Oral Daily Brett Canales, PA-C   81 mg at 01/05/15 1002  . glimepiride (AMARYL) tablet 2 mg  2 mg Oral QAC breakfast Brett Canales, PA-C   Stopped at 01/06/15 0730  . heparin ADULT infusion 100 units/mL (25000 units/250 mL)  1,450 Units/hr Intravenous Continuous Rebecka Apley, RPH 14.5 mL/hr at 01/05/15 2307 1,450 Units/hr at 01/05/15 2307  . insulin aspart (novoLOG) injection 0-9 Units  0-9 Units Subcutaneous TID WC Brett Canales, PA-C   3 Units at 01/05/15 1836  . irbesartan (AVAPRO) tablet 37.5 mg  37.5 mg Oral Daily Brett Canales, PA-C   37.5 mg at 01/05/15 1001  . magnesium hydroxide (MILK OF MAGNESIA) suspension 5 mL  5 mL Oral Daily PRN Josue Hector, MD   5 mL at 01/05/15 1544  . nitroGLYCERIN (NITROSTAT) SL tablet 0.4 mg  0.4 mg Sublingual Q5 min PRN Blanchie Dessert, MD      . nitroGLYCERIN 50 mg in dextrose 5 % 250 mL (0.2 mg/mL) infusion  2-200 mcg/min Intravenous Titrated Brett Canales, PA-C 6 mL/hr at 01/05/15 0817 20 mcg/min at 01/05/15 0817  . ondansetron (ZOFRAN) injection 4 mg  4 mg Intravenous Q6H PRN  Brett Canales, PA-C      . simvastatin (ZOCOR) tablet 20 mg  20 mg Oral QPM Brett Canales, PA-C   20 mg at 01/05/15 1835  . sodium chloride 0.9 % injection 3 mL  3 mL Intravenous Q12H Josue Hector, MD   3 mL at 01/05/15 2304  . sodium chloride 0.9 % injection 3 mL  3 mL Intravenous PRN Josue Hector, MD      . zolpidem [facility]) tablet 5 mg  5 mg Oral QHS Brett Canales, PA-C   5 mg at 01/05/15 2303    PE: General appearance: alert, cooperative and no distress Lungs: clear to auscultation bilaterally Heart: regular rate and rhythm and 1/6 sys MM Extremities: No LEE Pulses: 2+ and symmetric Skin: Warm and dry Neurologic: Grossly normal  Lab Results:   Recent Labs  01/04/15 0118 01/05/15 0500 01/06/15 0531  WBC 5.5 5.5 5.5  HGB 12.1* 12.0* 12.5*  HCT 35.5* 34.5* 36.4*  PLT 99* 81* 81*   BMET  Recent Labs  01/03/15 1206 01/04/15 0118  NA 138 138  K 4.0 3.8  CL 107 105  CO2 26 22  GLUCOSE 225* 168*  BUN 10 14  CREATININE 0.76 0.82  CALCIUM 9.3 9.0   PT/INR  Recent  Labs  01/05/15 0630  LABPROT 13.9  INR 1.06   Cholesterol  Recent Labs  01/04/15 0118  CHOL 111   Lipid Panel     Component Value Date/Time   CHOL 111 01/04/2015 0118   TRIG 91 01/04/2015 0118   HDL 28* 01/04/2015 0118   CHOLHDL 4.0 01/04/2015 0118   VLDL 18 01/04/2015 0118   LDLCALC 65 01/04/2015 0118    Assessment/Plan 74 yo male with a history of CABGx5 in 1996 by Dr. Servando Snare, DM, HTN, GERD. He saw Dr. Lovena Le for consideration of an ICD in 2011. He has not been seen in the office since 05/2011. At that time he had LEA dopplers with right ABI 0.9 and left 1.15. Last nuclear stress test 2010. Last echo 05/2011 with EF 35%(difficlt to estimate)-akinesis of the entire apex, with slight dyskinesis. He presents today with chest pain      Unstable angina Ruled out for MI.  Hepain and NTG. CABGx5 in 1996 by Dr. Servando Snare.  Left heart cath 1030hrs today.      Ischemic cardiomyopathy, EF  35% 05/2011   Chronic sys HF  Euvolemic.    DM type 2 (diabetes mellitus, type 2)  Increase to moderate coverage.   Essential hypertension  Stable, controlled.   Coronary atherosclerosis   NSVT (nonsustained ventricular tachycardia) Questionable periodic NSVT in the last 12 hours.  Looks like artifact.  He did have a short run 5-6 beats in ER.    LOS: 3 days    HAGER, BRYAN PA-C 01/06/2015 7:51 AM  Patient with remote CABG 5 in 1996 by Dr. Servando Snare.  Presented on 01/03/15 with history of moderately severe chest pressure.  IV nitroglycerin and IV heparin were started on admission.  He has ruled out for myocardial infarction.  He has a past history of nonischemic cardiomyopathy with ejection fraction approximately 35% in 2012.  He indicates no chest discomfort at present.  Awaiting cardiac catheterization. Lungs are clear.  The heart reveals a soft systolic murmur at the base.  No diastolic murmur. Plan cardiac catheterization today.

## 2015-01-06 NOTE — Progress Notes (Signed)
Site area: rt groin fa sheath removed and pressure held by [facility] Prior to Removal:  Level 0. Skin irritation from being shaved.  Pressure Applied For: 25 minutes Manual:   yes Patient Status During Pull:  stable Post Pull Site:  Level  0 Post Pull Instructions Given:  yes Post Pull Pulses Present: yes Dressing Applied:  tegaderm Bedrest begins @  9480 Comments:  0 complications

## 2015-01-06 NOTE — Progress Notes (Signed)
Site area: Rt fem Art Site Prior to Removal:  Level 0 Pressure Applied For:25 Manual: yes   Patient Status During Pull:  A/O Post Pull Site:  Level O Post Pull Instructions Given: Pt understands and can repeat back Post Pull Pulses Present: 2+ rt dp/pt Dressing Applied:  Bedrest begins17:25:00Comments:  Tegaderm applied and distal pulses are present.

## 2015-01-07 ENCOUNTER — Telehealth: Payer: Self-pay | Admitting: Cardiology

## 2015-01-07 ENCOUNTER — Other Ambulatory Visit: Payer: Self-pay | Admitting: Physician Assistant

## 2015-01-07 ENCOUNTER — Encounter (HOSPITAL_COMMUNITY): Payer: Self-pay | Admitting: Physician Assistant

## 2015-01-07 DIAGNOSIS — D696 Thrombocytopenia, unspecified: Secondary | ICD-10-CM

## 2015-01-07 LAB — GLUCOSE, CAPILLARY
Glucose-Capillary: 201 mg/dL — ABNORMAL HIGH (ref 70–99)
Glucose-Capillary: 181 mg/dL — ABNORMAL HIGH (ref 70–99)

## 2015-01-07 LAB — CBC
HCT: 35.6 % — ABNORMAL LOW (ref 39.0–52.0)
Hemoglobin: 12.2 g/dL — ABNORMAL LOW (ref 13.0–17.0)
MCH: 30.1 pg (ref 26.0–34.0)
MCHC: 34.3 g/dL (ref 30.0–36.0)
MCV: 87.9 fL (ref 78.0–100.0)
Platelets: 95 10*3/uL — ABNORMAL LOW (ref 150–400)
RBC: 4.05 MIL/uL — ABNORMAL LOW (ref 4.22–5.81)
RDW: 13.6 % (ref 11.5–15.5)
WBC: 5.5 10*3/uL (ref 4.0–10.5)

## 2015-01-07 LAB — HEPARIN LEVEL (UNFRACTIONATED): Heparin Unfractionated: <0.1 IU/mL — ABNORMAL LOW (ref 0.30–0.70)

## 2015-01-07 MED ORDER — CARVEDILOL 6.25 MG PO TABS: 6.2500 mg | ORAL_TABLET | Freq: Two times a day (BID) | ORAL | Status: DC

## 2015-01-07 MED ORDER — ISOSORBIDE MONONITRATE ER 30 MG PO TB24: 30.0000 mg | ORAL_TABLET | Freq: Every day | ORAL | Status: DC

## 2015-01-07 MED ORDER — METOPROLOL TARTRATE 12.5 MG HALF TABLET
12.5000 mg | ORAL_TABLET | Freq: Two times a day (BID) | ORAL | Status: DC
  Administered 2015-01-07: 12.5 mg via ORAL

## 2015-01-07 MED ORDER — NITROGLYCERIN 0.4 MG/SPRAY TL SOLN: 1.0000 | Status: DC | PRN

## 2015-01-07 NOTE — Progress Notes (Signed)
Patient Name: Troy [name] Date of Encounter: 01/07/2015     Principal Problem:   Unstable angina Active Problems:   DM type 2 (diabetes mellitus, type 2)   Essential hypertension   Coronary atherosclerosis   NSVT (nonsustained ventricular tachycardia)   Ischemic cardiomyopathy, EF 35% 05/2011    SUBJECTIVE  Patient is doing well post cardiac catheterization.  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  There was an occluded vein graft which most likely was the sequential graft supplying an obtuse marginal and distal circumflex vessel.  The ejection fraction at catheter was 35-40% with diffuse global hypocontractility.  He has mild thrombocytopenia.  Prior to admission he has been on aspirin simvastatin valsartan and has not been on any beta blocker for his left ventricular systolic dysfunction.  We will start low dose carvedilol.  CURRENT MEDS . aspirin EC  81 mg Oral Daily  . atorvastatin  80 mg Oral q1800  . carvedilol  6.25 mg Oral BID WC  . glimepiride  2 mg Oral QAC breakfast  . heparin  5,000 Units Subcutaneous 3 times per day  . insulin aspart  0-15 Units Subcutaneous TID WC  . irbesartan  37.5 mg Oral Daily  . isosorbide mononitrate  30 mg Oral Daily  . zolpidem  5 mg Oral QHS    OBJECTIVE  Filed Vitals:   01/06/15 2100 01/07/15 0658 01/07/15 1050 01/07/15 1101  BP: 148/63 118/51 161/73 161/73  Pulse: 59 49 58 58  Temp: 98.5 F (36.9 C) 98.7 F (37.1 C)    TempSrc:  Oral    Resp: 14 13 15    Height:      Weight:  219 lb 15.9 oz (99.788 kg)    SpO2: 96% 96%      Intake/Output Summary (Last 24 hours) at 01/07/15 1126 Last data filed at 01/07/15 0828  Gross per 24 hour  Intake 1752.37 ml  Output    525 ml  Net 1227.37 ml   Filed Weights   01/05/15 0500 01/06/15 0400 01/07/15 0658  Weight: 219 lb 14.4 oz (99.746 kg) 219 lb 6.4 oz (99.519 kg) 219 lb 15.9 oz (99.788 kg)    PHYSICAL EXAM  General: Pleasant, NAD. Neuro:  Alert and oriented X 3. Moves all extremities spontaneously. Psych: Normal affect. HEENT:  Normal  Neck: Supple without bruits or JVD. Lungs:  Resp regular and unlabored, CTA. Heart: RRR no s3, s4, or murmurs. Abdomen: Soft, non-tender, non-distended, BS + x 4.  Extremities: No clubbing, cyanosis or edema. DP/PT/Radials 2+ and equal bilaterally.  Accessory Clinical Findings  CBC  Recent Labs  01/06/15 2015 01/07/15 0528  WBC 6.0 5.5  HGB 12.8* 12.2*  HCT 37.6* 35.6*  MCV 88.9 87.9  PLT 91* 95*   Basic Metabolic Panel  Recent Labs  01/06/15 2015  CREATININE 0.69   Liver Function Tests No results for input(s): AST, ALT, ALKPHOS, BILITOT, PROT, ALBUMIN in the last 72 hours. No results for input(s): LIPASE, AMYLASE in the last 72 hours. Cardiac Enzymes No results for input(s): CKTOTAL, CKMB, CKMBINDEX, TROPONINI in the last 72 hours. BNP Invalid input(s): POCBNP D-Dimer No results for input(s): DDIMER in the last 72 hours. Hemoglobin A1C No results for input(s): HGBA1C in the last 72 hours. Fasting Lipid Panel No results for input(s): CHOL, HDL, LDLCALC, TRIG, CHOLHDL, LDLDIRECT in the last 72 hours. Thyroid Function Tests No results for input(s): TSH, T4TOTAL, T3FREE, THYROIDAB in the last 72 hours.  Invalid input(s): FREET3  TELE  Sinus bradycardia of 50-55 bpm  ECG    Radiology/Studies  Dg Chest 2 View  01/03/2015   CLINICAL DATA:  Sternal chest pain for 2 days.  Initial encounter.  EXAM: CHEST  2 VIEW  COMPARISON:  Abdominal CT 10/10/2014.  FINDINGS: The heart size and mediastinal contours are normal status post median sternotomy and CABG. One of the sternal wires is fractured. Mild linear atelectasis or scarring at the left lung base and mild central airway thickening are present. There is no edema, confluent airspace opacity or pleural effusion. No acute osseous findings are seen. The sternum demonstrates no acute findings.  IMPRESSION: No active  cardiopulmonary process status post CABG.   Electronically Signed   By: Richardean Sale M.D.   On: 01/03/2015 12:10    ASSESSMENT AND PLAN 1.  Ischemic cardiomyopathy ejection fraction 35-40% by catheter 2.  Status post CABG 5 in 1996.  Cardiac catheterization yesterday shows 70% lesion in the AV circumflex groove artery which will be treated medically.  Continue baby aspirin.  Patient has thrombocytopenia which will require close monitoring. 3.  Diabetes mellitus 4.  Essential hypertension 5.  Thrombocytopenia  Plan: Okay for discharge today.  We are adding carvedilol to his regimen. Follow-up with Dr. Jenkins Rouge Signed, Darlin Coco MD

## 2015-01-07 NOTE — Discharge Summary (Signed)
CARDIOLOGY DISCHARGE SUMMARY   Patient ID: Troy [name] MRN: [id number] DOB/AGE: 1941/06/23 74 y.o.  Admit date: 01/03/2015 Discharge date: 01/07/2015  PCP: [name], MD Primary Cardiologist: Dr. Johnsie Cancel (in 2012)  Primary Discharge Diagnosis:  Unstable angina Secondary Discharge Diagnosis:    DM type 2 (diabetes mellitus, type 2)   Essential hypertension   Coronary atherosclerosis   NSVT (nonsustained ventricular tachycardia)   Ischemic cardiomyopathy, EF 35% 05/2011   Thrombocytopenia  Procedures: Cardiac catheterization, coronary angiography the native coronary arteries, selective angiography into the saphenous vein grafts, and selective angiography into the left internal mammary artery, left ventriculography, 2-D echocardiogram   Hospital Course: VARTAN KERINS is a 74 y.o. male with a history of CAD. He had prolonged chest pain that started after some exertion that was unusual for him. When his symptoms did not resolve, he came to the emergency room. His ECG was similar to his previous ECG but had some changes concerning for angina. He was admitted for further evaluation and treatment.  He was started on IV heparin and nitroglycerin and continued on aspirin, statin and ARB. He had not been on a beta blocker because of underlying sinus bradycardia. His cardiac enzymes were negative. A 2-D echocardiogram was performed, results below. His EF had improved from previous values.  He was taken to the cath lab on 01/06/2015. Cardiac catheterization results are below. His symptoms were likely felt secondary to occlusion of the SVG-OM-dCFX. The circumflex itself has a 70% stenosis. His other bypass grafts were patent. EF was 35-40 percent. Medical therapy was recommended as an initial trial, but if he has symptomatology refractory to medical therapy, consideration can be given to percutaneous intervention of the circumflex.  Nonsustained VT was seen on 01/03/2015. Brief runs,  asymptomatic. He was continued on telemetry during his hospital stay. He was having occasional PVCs, but no further episodes of nonsustained VT are recorded. Because of the ventricular ectopy and left ventricular dysfunction, we will try a low dose of carvedilol and see how tolerated. Currently his heart rate is staying greater than 50.  He was noted to have thrombocytopenia, also seen in December 2015. No previous labs are available, so the duration of this is unknown. He is not having any active bleeding issues and this will be watched carefully.  On 01/07/2015, he was seen by Dr. Mare Ferrari and all data were reviewed. No further inpatient workup is indicated and he is considered stable for discharge, to follow-up as an outpatient with CBC.  Labs:   Lab Results  Component Value Date   WBC 5.5 01/07/2015   HGB 12.2* 01/07/2015   HCT 35.6* 01/07/2015   MCV 87.9 01/07/2015   PLT 95* 01/07/2015     Recent Labs Lab 01/03/15 1206 01/04/15 0118 01/06/15 2015  NA 138 138  --   K 4.0 3.8  --   CL 107 105  --   CO2 26 22  --   BUN 10 14  --   CREATININE 0.76 0.82 0.69  CALCIUM 9.3 9.0  --   PROT 6.9  --   --   BILITOT 1.0  --   --   ALKPHOS 49  --   --   ALT 32  --   --   AST 39*  --   --   GLUCOSE 225* 168*  --    Lab Results  Component Value Date   TROPONINI 0.03 01/04/2015   Lipid Panel  Component Value Date/Time   CHOL 111 01/04/2015 0118   TRIG 91 01/04/2015 0118   HDL 28* 01/04/2015 0118   CHOLHDL 4.0 01/04/2015 0118   VLDL 18 01/04/2015 0118   LDLCALC 65 01/04/2015 0118    Recent Labs  01/05/15 0630  INR 1.06      Radiology: Dg Chest 2 View 01/03/2015   CLINICAL DATA:  Sternal chest pain for 2 days.  Initial encounter.  EXAM: CHEST  2 VIEW  COMPARISON:  Abdominal CT 10/10/2014.  FINDINGS: The heart size and mediastinal contours are normal status post median sternotomy and CABG. One of the sternal wires is fractured. Mild linear atelectasis or scarring at the  left lung base and mild central airway thickening are present. There is no edema, confluent airspace opacity or pleural effusion. No acute osseous findings are seen. The sternum demonstrates no acute findings.  IMPRESSION: No active cardiopulmonary process status post CABG.   Electronically Signed   By: Richardean Sale M.D.   On: 01/03/2015 12:10    Cardiac Cath: 01/06/2015 ANGIOGRAPHY: Left main: Normal vessel which bifurcated into the LAD and left circumflex coronary artery LAD: Moderate size proximal vessel that was totally occluded after the first septal and diagonal vessel. Left circumflex: good size vessel that had 70% mid stenosis before a small marginal branch. The distal vessel was small caliber and there was retrograde filling of a previous sequential graft most likely between the small marginal and distal circumflex. Right coronary artery: Moderate size vessel with calcification and 80% proximal stenosis with a small area of probable thrombus in its midsegment prior to total mid occlusion. LIMA to LAD: Widely patent and anastomosed into the mid LAD. There is retrograde filling to the proximal occlusion. The anastomosis was free of significant disease extended to the apex. SVG sequential graft supplying a marginal and distal circumflex was totally occluded at its origin. SVG E high marginal or ramus intermediate vessel was widely patent. There was mild 20% proximal smooth narrowing. Left ventriculography revealed global LV dysfunction with an ejection fraction of 35 to less than 40%. IMPRESSION: Ischemic cardiomyopathy with an ejection fraction of 35 to less than 40% with diffuse global hypocontractility. Significant native coronary obstructive disease with total occlusion of the proximal LAD after the septal diagonal vessel; 70% stenosis in the proximal AV groove circumflex with occlusion of the native OM branch and retrograde filling of the sequential limb distally to this mid branch and  total occlusion of the mid right coronary artery with 80% calcified proximal stenosis and small area of thrombus in the mid RCA prior to the total occlusion. Occluded vein graft which most likely was the sequential graft supplying an OM and distal circumflex vessel the circumflex coronary artery. Patent SVG which seems to supply a high marginal or ramus intermediate-like vessel this 20% proximal narrowing in the graft. Patent SVG to the PDA branch of the RCA. Patent LIMA graft to the mid LAD. RECOMMENDATION: The patient has an occluded graft which most likely sequentially supplied a small OM and distal circumflex vessel. 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 Platelets today are low at 81,000. He has not been on any significant antianginal regimen. Initial medical therapy will be recommended with close follow-up of his platelets. If recurrent symptomatology develops consider possible PCI to the native circumflex  EKG: Sinus bradycardia with first-degree AV block, heart rate 50  Echo: 01/04/2015 Conclusions - Left ventricle: Endocardium is poorly visualized, but there is hypokinesis of the basal  inferior and inferolateral walls. The cavity size was normal. Systolic function was normal. The estimated ejection fraction was in the range of 50% to 55%. Wall motion was normal; there were no regional wall motion abnormalities. Doppler parameters are consistent with abnormal left ventricular relaxation (grade 1 diastolic dysfunction). Doppler parameters are consistent with elevated ventricular end-diastolic filling pressure. - Aortic valve: Trileaflet; mildly thickened, mildly calcified leaflets. There was no regurgitation. - Aortic root: The aortic root was normal in size. - Ascending aorta: The ascending aorta was normal in size. - Left atrium: The atrium was normal in size. - Right atrium: The atrium was normal in size. - Tricuspid valve: There was mild  regurgitation. - Pulmonic valve: There was trivial regurgitation. - Pulmonary arteries: Systolic pressure was within the normal range. - Inferior vena cava: The vessel was normal in size. - Pericardium, extracardiac: There was no pericardial effusion.  FOLLOW UP PLANS AND APPOINTMENTS No Known Allergies   Medication List    STOP taking these medications        ciprofloxacin 500 MG tablet  Commonly known as:  CIPRO     phenazopyridine 200 MG tablet  Commonly known as:  PYRIDIUM      TAKE these medications        aspirin 81 MG EC tablet  Take 81 mg by mouth daily.     carvedilol 6.25 MG tablet  Commonly known as:  COREG  Take 1 tablet (6.25 mg total) by mouth 2 (two) times daily with a meal.     fish oil-omega-3 fatty acids 1000 MG capsule  Take 2 g by mouth daily.     glimepiride 2 MG tablet  Commonly known as:  AMARYL  Take 2 mg by mouth daily before breakfast.     isosorbide mononitrate 30 MG 24 hr tablet  Commonly known as:  IMDUR  Take 1 tablet (30 mg total) by mouth daily.     KOMBIGLYZE XR 01-999 MG Tb24  Generic drug:  Saxagliptin-Metformin  Take 1 tablet by mouth daily.     nitroGLYCERIN 0.4 MG/SPRAY spray  Commonly known as:  NITROLINGUAL  Place 1 spray under the tongue every 5 (five) minutes x 3 doses as needed for chest pain.     OYSTER SHELL CALCIUM 500 + D PO  Take 1 tablet by mouth daily.     simvastatin 20 MG tablet  Commonly known as:  ZOCOR  Take 20 mg by mouth every evening.     valsartan 80 MG tablet  Commonly known as:  DIOVAN  Take 40 mg by mouth daily.     vitamin C 1000 MG tablet  Take 1,000 mg by mouth daily.     zolpidem 10 MG tablet  Commonly known as:  AMBIEN  Take 1 tablet by mouth at bedtime.        Discharge Instructions    Diet - low sodium heart healthy    Complete by:  As directed      Diet Carb Modified    Complete by:  As directed      Increase activity slowly    Complete by:  As directed            Follow-up Information    Follow up with Jenkins Rouge, MD.   Specialty:  Cardiology   Why:  The office will call.   Contact information:   [address] [place] [address] [phone]       BRING ALL MEDICATIONS  WITH YOU TO FOLLOW UP APPOINTMENTS  Time spent with patient to include physician time: 39 min Signed: Rosaria Ferries, PA-C 01/07/2015, 12:40 PM Co-Sign MD

## 2015-01-07 NOTE — Telephone Encounter (Signed)
7 day TOC -appt 01-14-15 at [place] with Nicki Reaper

## 2015-01-07 NOTE — Discharge Instructions (Signed)
PLEASE REMEMBER TO BRING ALL OF YOUR MEDICATIONS TO EACH OF YOUR FOLLOW-UP OFFICE VISITS.  PLEASE ATTEND ALL SCHEDULED FOLLOW-UP APPOINTMENTS.   Activity: Increase activity slowly as tolerated. You may shower, but no soaking baths (or swimming) for 1 week. No driving for 2 days. No lifting over 5 lbs for 1 week. No sexual activity for 1 week.   You May Return to Work: in 1 week (if applicable)  Wound Care: You may wash cath site gently with soap and water. Keep cath site clean and dry. If you notice pain, swelling, bleeding or pus at your cath site, please call [phone].    Cardiac Cath Site Care Refer to this sheet in the next few weeks. These instructions provide you with information on caring for yourself after your procedure. Your caregiver may also give you more specific instructions. Your treatment has been planned according to current medical practices, but problems sometimes occur. Call your caregiver if you have any problems or questions after your procedure. HOME CARE INSTRUCTIONS  You may shower 24 hours after the procedure. Remove the bandage (dressing) and gently wash the site with plain soap and water. Gently pat the site dry.   Do not apply powder or lotion to the site.   Do not sit in a bathtub, swimming pool, or whirlpool for 5 to 7 days.   No bending, squatting, or lifting anything over 10 pounds (4.5 kg) as directed by your caregiver.   Inspect the site at least twice daily.   Do not drive home if you are discharged the same day of the procedure. Have someone else drive you.   You may drive 24 hours after the procedure unless otherwise instructed by your caregiver.  What to expect:  Any bruising will usually fade within 1 to 2 weeks.   Blood that collects in the tissue (hematoma) may be painful to the touch. It should usually decrease in size and tenderness within 1 to 2 weeks.  SEEK IMMEDIATE MEDICAL CARE IF:  You have unusual pain at the site or down the  affected limb.   You have redness, warmth, swelling, or pain at the site.   You have drainage (other than a small amount of blood on the dressing).   You have chills.   You have a fever or persistent symptoms for more than 72 hours.   You have a fever and your symptoms suddenly get worse.   Your leg becomes pale, cool, tingly, or numb.   You have heavy bleeding from the site. Hold pressure on the site.  Document Released: 10/16/2010 Document Revised: 09/02/2011 Document Reviewed: 10/16/2010 [facility] Patient Information 2012 [place].   Cardiac Diet This diet can help prevent heart disease and stroke. Many factors influence your heart health, including eating and exercise habits. Coronary risk rises a lot with abnormal blood fat (lipid) levels. Cardiac meal planning includes limiting unhealthy fats, increasing healthy fats, and making other small dietary changes. General guidelines are as follows:  Adjust calorie intake to reach and maintain desirable body weight.  Limit total fat intake to less than 30% of total calories. Saturated fat should be less than 7% of calories.  Saturated fats are found in animal products and in some vegetable products. Saturated vegetable fats are found in coconut oil, cocoa butter, palm oil, and palm kernel oil. Read labels carefully to avoid these products as much as possible. Use butter in moderation. Choose tub margarines and oils that have 2 grams of fat or  less. Good cooking oils are canola and olive oils.  Practice low-fat cooking techniques. Do not fry food. Instead, broil, bake, boil, steam, grill, roast on a rack, stir-fry, or microwave it. Other fat reducing suggestions include:  Remove the skin from poultry.  Remove all visible fat from meats.  Skim the fat off stews, soups, and gravies before serving them.  Steam vegetables in water or broth instead of sauting them in fat.  Avoid foods with trans fat (or hydrogenated oils), such  as commercially fried foods and commercially baked goods. Commercial shortening and deep-frying fats will contain trans fat.  Increase intake of fruits, vegetables, whole grains, and legumes to replace foods high in fat.  Increase consumption of nuts, legumes, and seeds to at least 4 servings weekly. One serving of a legume equals  cup, and 1 serving of nuts or seeds equals  cup.  Choose whole grains more often. Have 3 servings per day (a serving is 1 ounce [oz]).  Eat 4 to 5 servings of vegetables per day. A serving of vegetables is 1 cup of raw leafy vegetables;  cup of raw or cooked cut-up vegetables;  cup of vegetable juice.  Eat 4 to 5 servings of fruit per day. A serving of fruit is 1 medium whole fruit;  cup of dried fruit;  cup of fresh, frozen, or canned fruit;  cup of 100% fruit juice.  Increase your intake of dietary fiber to 20 to 30 grams per day. Insoluble fiber may help lower your risk of heart disease and may help curb your appetite.  Soluble fiber binds cholesterol to be removed from the blood. Foods high in soluble fiber are dried beans, citrus fruits, oats, apples, bananas, broccoli, Brussels sprouts, and eggplant.  Try to include foods fortified with plant sterols or stanols, such as yogurt, breads, juices, or margarines. Choose several fortified foods to achieve a daily intake of 2 to 3 grams of plant sterols or stanols.  Foods with omega-3 fats can help reduce your risk of heart disease. Aim to have a 3.5 oz portion of fatty fish twice per week, such as salmon, mackerel, albacore tuna, sardines, lake trout, or herring. If you wish to take a fish oil supplement, choose one that contains 1 gram of both DHA and EPA.  Limit processed meats to 2 servings (3 oz portion) weekly.  Limit the sodium in your diet to 1500 milligrams (mg) per day. If you have high blood pressure, talk to a registered dietitian about a DASH (Dietary Approaches to Stop Hypertension) eating  plan.  Limit sweets and beverages with added sugar, such as soda, to no more than 5 servings per week. One serving is:   1 tablespoon sugar.  1 tablespoon jelly or jam.   cup sorbet.  1 cup lemonade.   cup regular soda. CHOOSING FOODS Starches  Allowed: Breads: All kinds (wheat, rye, raisin, white, oatmeal, [place], [place], and English muffin bread). Low-fat rolls: English muffins, frankfurter and hamburger buns, bagels, pita bread, tortillas (not fried). Pancakes, waffles, biscuits, and muffins made with recommended oil.  Avoid: Products made with saturated or trans fats, oils, or whole milk products. Butter rolls, cheese breads, croissants. Commercial doughnuts, muffins, sweet rolls, biscuits, waffles, pancakes, store-bought mixes. Crackers  Allowed: Low-fat crackers and snacks: Animal, graham, rye, saltine (with recommended oil, no lard), oyster, and matzo crackers. Bread sticks, melba toast, rusks, flatbread, pretzels, and light popcorn.  Avoid: High-fat crackers: cheese crackers, butter crackers, and those made with coconut, palm  oil, or trans fat (hydrogenated oils). Buttered popcorn. Cereals  Allowed: Hot or cold whole-grain cereals.  Avoid: Cereals containing coconut, hydrogenated vegetable fat, or animal fat. Potatoes / Pasta / Rice  Allowed: All kinds of potatoes, rice, and pasta (such as macaroni, spaghetti, and noodles).  Avoid: Pasta or rice prepared with cream sauce or high-fat cheese. Chow mein noodles, [place] fries. Vegetables  Allowed: All vegetables and vegetable juices.  Avoid: Fried vegetables. Vegetables in cream, butter, or high-fat cheese sauces. Limit coconut. Fruit in cream or custard. Protein  Allowed: Limit your intake of meat, seafood, and poultry to no more than 6 oz (cooked weight) per day. All lean, well-trimmed beef, veal, pork, and lamb. All chicken and [place] without skin. All fish and shellfish. Wild game: wild duck, rabbit, pheasant, and  venison. Egg whites or low-cholesterol egg substitutes may be used as desired. Meatless dishes: recipes with dried beans, peas, lentils, and tofu (soybean curd). Seeds and nuts: all seeds and most nuts.  Avoid: Prime grade and other heavily marbled and fatty meats, such as short ribs, spare ribs, rib eye roast or steak, frankfurters, sausage, bacon, and high-fat luncheon meats, mutton. Caviar. Commercially fried fish. Domestic duck, goose, venison sausage. Organ meats: liver, gizzard, heart, chitterlings, brains, kidney, sweetbreads. Dairy  Allowed: Low-fat cheeses: nonfat or low-fat cottage cheese (1% or 2% fat), cheeses made with part skim milk, such as mozzarella, farmers, string, or ricotta. (Cheeses should be labeled no more than 2 to 6 grams fat per oz.). Skim (or 1%) milk: liquid, powdered, or evaporated. Buttermilk made with low-fat milk. Drinks made with skim or low-fat milk or cocoa. Chocolate milk or cocoa made with skim or low-fat (1%) milk. Nonfat or low-fat yogurt.  Avoid: Whole milk cheeses, including colby, cheddar, [place], Monterey Jack, [place], [place], [place], American, Swiss, and blue. Creamed cottage cheese, cream cheese. Whole milk and whole milk products, including buttermilk or yogurt made from whole milk, drinks made from whole milk. Condensed milk, evaporated whole milk, and 2% milk. Soups and Combination Foods  Allowed: Low-fat low-sodium soups: broth, dehydrated soups, homemade broth, soups with the fat removed, homemade cream soups made with skim or low-fat milk. Low-fat spaghetti, lasagna, chili, and Spanish rice if low-fat ingredients and low-fat cooking techniques are used.  Avoid: Cream soups made with whole milk, cream, or high-fat cheese. All other soups. Desserts and Sweets  Allowed: Sherbet, fruit ices, gelatins, meringues, and angel food cake. Homemade desserts with recommended fats, oils, and milk products. Jam, jelly, honey, marmalade, sugars, and syrups.  Pure sugar candy, such as gum drops, hard candy, jelly beans, marshmallows, mints, and small amounts of dark chocolate.  Avoid: Commercially prepared cakes, pies, cookies, frosting, pudding, or mixes for these products. Desserts containing whole milk products, chocolate, coconut, lard, palm oil, or palm kernel oil. Ice cream or ice cream drinks. Candy that contains chocolate, coconut, butter, hydrogenated fat, or unknown ingredients. Buttered syrups. Fats and Oils  Allowed: Vegetable oils: safflower, sunflower, corn, soybean, cottonseed, sesame, canola, olive, or peanut. Non-hydrogenated margarines. Salad dressing or mayonnaise: homemade or commercial, made with a recommended oil. Low or nonfat salad dressing or mayonnaise.  Limit added fats and oils to 6 to 8 tsp per day (includes fats used in cooking, baking, salads, and spreads on bread). Remember to count the "hidden fats" in foods.  Avoid: Solid fats and shortenings: butter, lard, salt pork, bacon drippings. Gravy containing meat fat, shortening, or suet. Cocoa butter, coconut. Coconut oil, palm oil, palm kernel  oil, or hydrogenated oils: these ingredients are often used in bakery products, nondairy creamers, whipped toppings, candy, and commercially fried foods. Read labels carefully. Salad dressings made of unknown oils, sour cream, or cheese, such as blue cheese and Roquefort. Cream, all kinds: half-and-half, light, heavy, or whipping. Sour cream or cream cheese (even if "light" or low-fat). Nondairy cream substitutes: coffee creamers and sour cream substitutes made with palm, palm kernel, hydrogenated oils, or coconut oil. Beverages  Allowed: Coffee (regular or decaffeinated), tea. Diet carbonated beverages, mineral water. Alcohol: Check with your caregiver. Moderation is recommended.  Avoid: Whole milk, regular sodas, and juice drinks with added sugar. Condiments  Allowed: All seasonings and condiments. Cocoa powder. "Cream" sauces made  with recommended ingredients.  Avoid: Carob powder made with hydrogenated fats. SAMPLE MENU Breakfast   cup orange juice   cup oatmeal  1 slice toast  1 tsp margarine  1 cup skim milk Lunch  [place] sandwich with 2 oz [place], 2 slices bread  Lettuce and tomato slices  Fresh fruit  Carrot sticks  Coffee or tea Snack  Fresh fruit or low-fat crackers Dinner  3 oz lean ground beef  1 baked potato  1 tsp margarine   cup asparagus  Lettuce salad  1 tbs non-creamy dressing   cup peach slices  1 cup skim milk Document Released: 06/22/2008 Document Revised: 03/14/2012 Document Reviewed: 11/13/2013 [employer] Patient Information 2015 [place], [place]. This information is not intended to replace advice given to you by your health care provider. Make sure you discuss any questions you have with your health care provider.   Diabetes Mellitus and Food It is important for you to manage your blood sugar (glucose) level. Your blood glucose level can be greatly affected by what you eat. Eating healthier foods in the appropriate amounts throughout the day at about the same time each day will help you control your blood glucose level. It can also help slow or prevent worsening of your diabetes mellitus. Healthy eating may even help you improve the level of your blood pressure and reach or maintain a healthy weight.  HOW CAN FOOD AFFECT ME? Carbohydrates Carbohydrates affect your blood glucose level more than any other type of food. Your dietitian will help you determine how many carbohydrates to eat at each meal and teach you how to count carbohydrates. Counting carbohydrates is important to keep your blood glucose at a healthy level, especially if you are using insulin or taking certain medicines for diabetes mellitus. Alcohol Alcohol can cause sudden decreases in blood glucose (hypoglycemia), especially if you use insulin or take certain medicines for diabetes mellitus.  Hypoglycemia can be a life-threatening condition. Symptoms of hypoglycemia (sleepiness, dizziness, and disorientation) are similar to symptoms of having too much alcohol.  If your health care provider has given you approval to drink alcohol, do so in moderation and use the following guidelines:  Women should not have more than one drink per day, and men should not have more than two drinks per day. One drink is equal to:  12 oz of beer.  5 oz of wine.  1 oz of hard liquor.  Do not drink on an empty stomach.  Keep yourself hydrated. Have water, diet soda, or unsweetened iced tea.  Regular soda, juice, and other mixers might contain a lot of carbohydrates and should be counted. WHAT FOODS ARE NOT RECOMMENDED? As you make food choices, it is important to remember that all foods are not the same. Some foods have fewer  nutrients per serving than other foods, even though they might have the same number of calories or carbohydrates. It is difficult to get your body what it needs when you eat foods with fewer nutrients. Examples of foods that you should avoid that are high in calories and carbohydrates but low in nutrients include:  Trans fats (most processed foods list trans fats on the Nutrition Facts label).  Regular soda.  Juice.  Candy.  Sweets, such as cake, pie, doughnuts, and cookies.  Fried foods. WHAT FOODS CAN I EAT? Have nutrient-rich foods, which will nourish your body and keep you healthy. The food you should eat also will depend on several factors, including:  The calories you need.  The medicines you take.  Your weight.  Your blood glucose level.  Your blood pressure level.  Your cholesterol level. You also should eat a variety of foods, including:  Protein, such as meat, poultry, fish, tofu, nuts, and seeds (lean animal proteins are best).  Fruits.  Vegetables.  Dairy products, such as milk, cheese, and yogurt (low fat is best).  Breads, grains, pasta,  cereal, rice, and beans.  Fats such as olive oil, trans fat-free margarine, canola oil, avocado, and olives. DOES EVERYONE WITH DIABETES MELLITUS HAVE THE SAME MEAL PLAN? Because every person with diabetes mellitus is different, there is not one meal plan that works for everyone. It is very important that you meet with a dietitian who will help you create a meal plan that is just right for you. Document Released: 06/10/2005 Document Revised: 09/18/2013 Document Reviewed: 08/10/2013 [facility] Patient Information 2015 [place], [place]. This information is not intended to replace advice given to you by your health care provider. Make sure you discuss any questions you have with your health care provider.

## 2015-01-07 NOTE — Care Management Note (Signed)
    Page 1 of 1   01/07/2015     10:01:44 AM CARE MANAGEMENT NOTE 01/07/2015  Patient:  Troy [name], Troy [name]   Account Number:  [id number]  Date Initiated:  01/07/2015  Documentation initiated by:  GRAVES-BIGELOW,Devra Stare  Subjective/Objective Assessment:   Pt admitted for SOB. S/p cardiac cath.     Action/Plan:   No needs identified by CM at this time.   Anticipated DC Date:  01/07/2015   Anticipated DC Plan:  [place]  CM consult      Choice offered to / List presented to:             Status of service:  Completed, signed off Medicare Important Message given?  YES (If response is "NO", the following Medicare IM given date fields will be blank) Date Medicare IM given:  01/06/2015 Medicare IM given by:  GRAVES-BIGELOW,Tersa Fotopoulos Date Additional Medicare IM given:   Additional Medicare IM given by:    Discharge Disposition:  HOME/SELF CARE  Per UR Regulation:  Reviewed for med. necessity/level of care/duration of stay  If discussed at [place] of Stay Meetings, dates discussed:    Comments:

## 2015-01-07 NOTE — Progress Notes (Signed)
CARDIAC REHAB PHASE I   Pt ready for d/c, RN discussing instructions. I discussed with pt and wife diet change to improve DM and low sodium. Encouraged daily wts to monitor for fluid. Encouraged pt to increase ex/activity where able. 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 Insurance would not cover CRPII. [id number]  Troy [name] CES, ACSM 01/07/2015 1:54 PM

## 2015-01-08 NOTE — Telephone Encounter (Signed)
Patient contacted regarding discharge from [facility] on 01/07/15.  Patient understands to follow up with Richardson Dopp, PA on 01/14/15 at 10:00 a.m. at [facility] office.  Patient understands discharge instructions?  Yes  Patient understands medications and regiment?  Yes  Patient understands to bring all medications to this visit?  Yes  Other details: None

## 2015-01-13 NOTE — Progress Notes (Signed)
Cardiology Office Note   Date:  01/14/2015   ID:  Troy [name], DOB 02-26-1941, MRN [id number]  PCP:  Sheela Stack, MD  Cardiologist:  Dr. Jenkins Rouge  >> Requests FU in [facility]   Chief Complaint  Patient presents with  . Coronary Artery Disease  . Hospitalization Follow-up     History of Present Illness: Troy [name] is a 74 y.o. male with a hx of CAD s/p CABG 1996, ICM, DM2, HTN, HL.  Last seen by Dr. Jenkins Rouge in 2012.    Admitted 4/8-4/12 with unstable angina.  CEs remained negative.  LHC demonstrated patent L-LAD, patent S-PDA, patent S-RI and an occluded S-OM/distCFX.  The native AVCFX had 70% and occlusion of native OM branch.  Med Rx was recommended.  If he failed medical Rx, PCI of the CFX could be considered.  EF was 35-40% by LHC and normal by echo.  He did have NSVT and low dose beta-blocker was added.    He returns for FU.  Since discharge, he has been doing well. The patient denies any chest pain, significant dyspnea, syncope, orthopnea, PND, edema.   Studies/Reports Reviewed Today:  LHC 01/06/15 LM: Normal vessel  LAD:  totally occluded after the first septal and diagonal vessel. LCx:   70% mid, distal vessel small caliber with retrograde filling of a previous sequential graft most likely between the small marginal and distal circumflex. RCA:   80% proximal stenosis with a small area of probable thrombus in its midsegment prior to total mid occlusion. LIMA to LAD: Widely patent  SVG  sequential graft supplying a marginal and distal circumflex was totally occluded at its origin. SVG to high marginal or ramus intermediate vessel was widely patent. There was mild 20% proximal smooth narrowing. Left ventriculography revealed global  LV dysfunction with an ejection fraction of 35 to less than 40%.  Echo 01/04/15 - HK of the basal inferior and inferolateral walls. EF 50% to 55%. Grade 1 diastolic dysfunction.  - Tricuspid valve: There was mild  regurgitation. - Pulmonic valve: There was trivial regurgitation. - Pulmonary arteries: Systolic pressure was within the normal range.   Past Medical History  Diagnosis Date  . CAD (coronary artery disease)     a. s/p CABG in 1996;  b.  Nuclear 2/11:  EF 34%,  large anterior and apical scar. There is mild peri-infarct ischemia;  c.  LHC 4/16:  LAD occld, LCx mid 70%, OM branch occl, RCA 80% prox, occl mid, L-LAD ok, S-OM/dCFX 100%, S-RI ok, EF 35-40% >> med Rx - PCI of CFX if refractory angina  . Diabetes mellitus   . Hypertension   . PAD (peripheral artery disease)     Dr. Oneida Alar in the past  . Myocardial infarction 1996  . HLD (hyperlipidemia)   . GERD (gastroesophageal reflux disease)   . Hard of hearing     has hearing aids /BIL  . Thrombocytopenia   . Ischemic cardiomyopathy     a. eval by EP in 2011 - no ICD needed at that time;  b.  Echo 4/16: inf and inf-lat HK, EF 50-55%, Gr 1 DD;  c. LHC 4/16: EF 35-40%    Past Surgical History  Procedure Laterality Date  . Colonoscopy    . Coronary artery bypass graft  1996    5 vessels  . Cataract extraction w/phaco Right 08/06/2013    Procedure: CATARACT EXTRACTION PHACO AND INTRAOCULAR LENS PLACEMENT RIGHT EYE;  Surgeon: Tonny Branch, MD;  Location: [facility];  Service: Ophthalmology;  Laterality: Right;  CDE:  15.48  . Hernia repair      groin-25 yrs ago  . Colonoscopy with propofol N/A 10/22/2013    Procedure: COLONOSCOPY WITH PROPOFOL;  Surgeon: Irene Shipper, MD;  Location: [facility];  Service: Endoscopy;  Laterality: N/A;  . Left heart catheterization with coronary/graft angiogram N/A 01/06/2015    Procedure: LEFT HEART CATHETERIZATION WITH Beatrix Fetters;  Surgeon: Troy Sine, MD;  Location: [facility] CATH LAB;  Service: Cardiovascular;  Laterality: N/A;     Current Outpatient Prescriptions  Medication Sig Dispense Refill  . Ascorbic Acid (VITAMIN C) 1000 MG tablet Take 1,000 mg by mouth daily.      Marland Kitchen aspirin 81 MG EC  tablet Take 81 mg by mouth daily.      . Calcium Carbonate-Vitamin D (OYSTER SHELL CALCIUM 500 + D PO) Take 1 tablet by mouth daily.      . carvedilol (COREG) 6.25 MG tablet Take 1 tablet (6.25 mg total) by mouth 2 (two) times daily with a meal. 60 tablet 11  . fish oil-omega-3 fatty acids 1000 MG capsule Take 2 g by mouth daily.      Marland Kitchen glimepiride (AMARYL) 2 MG tablet Take 2 mg by mouth daily before breakfast.      . isosorbide mononitrate (IMDUR) 30 MG 24 hr tablet Take 1 tablet (30 mg total) by mouth daily. 30 tablet 11  . nitroGLYCERIN (NITROLINGUAL) 0.4 MG/SPRAY spray Place 1 spray under the tongue every 5 (five) minutes x 3 doses as needed for chest pain. 12 g 12  . Saxagliptin-Metformin (KOMBIGLYZE XR) 01-999 MG TB24 Take 1 tablet by mouth daily.     . simvastatin (ZOCOR) 20 MG tablet Take 20 mg by mouth every evening.     . valsartan (DIOVAN) 80 MG tablet Take 40 mg by mouth daily.    Marland Kitchen zolpidem (AMBIEN) 10 MG tablet Take 1 tablet by mouth at bedtime.      No current facility-administered medications for this visit.    Allergies:   Review of patient's allergies indicates no known allergies.    Social History:  The patient  reports that he quit smoking about 24 years ago. His smoking use included Cigarettes. He has a 30 pack-year smoking history. He has never used smokeless tobacco. He reports that he does not drink alcohol or use illicit drugs.   Family History:  The patient's family history includes Cancer in his brother; Diabetes in his brother and sister; Heart attack in his brother, father, mother, and sister; Heart disease in his father and mother; Hypertension in his brother, father, mother, and sister. There is no history of Stroke.    ROS:   Please see the history of present illness.   Review of Systems  All other systems reviewed and are negative.    PHYSICAL EXAM: VS:  BP 148/58 mmHg  Pulse 59  Ht 5\' 9"  (1.753 m)  Wt 217 lb (98.431 kg)  BMI 32.03 kg/m2    Wt  Readings from Last 3 Encounters:  01/14/15 217 lb (98.431 kg)  01/07/15 219 lb 15.9 oz (99.788 kg)  09/23/14 217 lb (98.431 kg)     GEN: Well nourished, well developed, in no acute distress HEENT: normal Neck: no JVD,  no masses Cardiac:  Normal S1/S2, RRR; no murmur, no rubs or gallops, no edema; R groin without hematoma or bruit  Respiratory:  clear to auscultation bilaterally, no wheezing, rhonchi or rales.  GI: soft, nontender, nondistended, + BS MS: no deformity or atrophy Skin: warm and dry  Neuro:  CNs II-XII intact, Strength and sensation are intact Psych: Normal affect   EKG:  EKG is ordered today.  It demonstrates:   Sinus bradycardia, HR 59, normal axis, IVCD, T-wave inversions in 1, aVL, V2-V6 change from prior tracing   Recent Labs: 01/03/2015: ALT 32; TSH 1.004 01/04/2015: BUN 14; Potassium 3.8; Sodium 138 01/06/2015: Creatinine 0.69 01/07/2015: Hemoglobin 12.2*; Platelets 95*    Lipid Panel    Component Value Date/Time   CHOL 111 01/04/2015 0118   TRIG 91 01/04/2015 0118   HDL 28* 01/04/2015 0118   CHOLHDL 4.0 01/04/2015 0118   VLDL 18 01/04/2015 0118   LDLCALC 65 01/04/2015 0118      ASSESSMENT AND PLAN:  Coronary artery disease involving native coronary artery of native heart without angina pectoris No angina.  Continue current Rx that includes beta-blocker, angiotensin receptor blocker, nitrates, ASA, statin.  Ischemic cardiomyopathy EF was 50-55% by echo in the hospital but 35-40% by cath.  EF has been in the 35-40% range in the past.  ICD implant was not recommended in 2011.  No evidence of volume excess.  I will route my note to Dr. Jenkins Rouge to see if this patient should be considered for MRI or MUGA to quantify EF.  Essential hypertension BP borderline controlled.  Increase Valsartan to 80 mg Once daily.  Check BMET 2 weeks.   Hyperlipidemia Continue statin.  Thrombocytopenia  Noted in the hospital.  FU CBC w/Diff to be drawn today.     Current medicines are reviewed at length with the patient today.  Concerns regarding medicines are as outlined above.  The following changes have been made:    Increase Valsartan to 80 mg QD.    Labs/ tests ordered today include:   Orders Placed This Encounter  Procedures  . CBC w/Diff  . EKG 12-Lead    Disposition:   FU with Dr. Domenic Polite, Dr. Jacinta Shoe or Dr. Harl Bowie in [place] in 3 mos.    Signed, Versie Starks, MHS 01/14/2015 10:38 AM    [facility] [address], [place], [place]  [address] Phone: [phone]; Fax: [phone]

## 2015-01-14 ENCOUNTER — Other Ambulatory Visit (INDEPENDENT_AMBULATORY_CARE_PROVIDER_SITE_OTHER): Payer: Medicare Other | Admitting: *Deleted

## 2015-01-14 ENCOUNTER — Telehealth: Payer: Self-pay | Admitting: *Deleted

## 2015-01-14 ENCOUNTER — Ambulatory Visit (INDEPENDENT_AMBULATORY_CARE_PROVIDER_SITE_OTHER): Payer: Medicare Other | Admitting: Physician Assistant

## 2015-01-14 ENCOUNTER — Encounter: Payer: Self-pay | Admitting: Physician Assistant

## 2015-01-14 VITALS — BP 148/58 | HR 59 | Ht 69.0 in | Wt 217.0 lb

## 2015-01-14 DIAGNOSIS — I10 Essential (primary) hypertension: Secondary | ICD-10-CM | POA: Diagnosis not present

## 2015-01-14 DIAGNOSIS — D696 Thrombocytopenia, unspecified: Secondary | ICD-10-CM | POA: Diagnosis not present

## 2015-01-14 DIAGNOSIS — I255 Ischemic cardiomyopathy: Secondary | ICD-10-CM

## 2015-01-14 DIAGNOSIS — I2510 Atherosclerotic heart disease of native coronary artery without angina pectoris: Secondary | ICD-10-CM | POA: Diagnosis not present

## 2015-01-14 DIAGNOSIS — E785 Hyperlipidemia, unspecified: Secondary | ICD-10-CM | POA: Diagnosis not present

## 2015-01-14 DIAGNOSIS — I25110 Atherosclerotic heart disease of native coronary artery with unstable angina pectoris: Secondary | ICD-10-CM

## 2015-01-14 LAB — CBC WITH DIFFERENTIAL/PLATELET
Basophils Absolute: 0 10*3/uL (ref 0.0–0.1)
Basophils Relative: 0.4 % (ref 0.0–3.0)
Eosinophils Absolute: 0.1 10*3/uL (ref 0.0–0.7)
Eosinophils Relative: 1.6 % (ref 0.0–5.0)
HCT: 41.9 % (ref 39.0–52.0)
Hemoglobin: 14.6 g/dL (ref 13.0–17.0)
Lymphocytes Relative: 18.1 % (ref 12.0–46.0)
Lymphs Abs: 1.2 10*3/uL (ref 0.7–4.0)
MCHC: 34.8 g/dL (ref 30.0–36.0)
MCV: 87.9 fl (ref 78.0–100.0)
Monocytes Absolute: 0.7 10*3/uL (ref 0.1–1.0)
Monocytes Relative: 10.9 % (ref 3.0–12.0)
Neutro Abs: 4.5 10*3/uL (ref 1.4–7.7)
Neutrophils Relative %: 69 % (ref 43.0–77.0)
Platelets: 149 10*3/uL — ABNORMAL LOW (ref 150.0–400.0)
RBC: 4.77 Mil/uL (ref 4.22–5.81)
RDW: 13.5 % (ref 11.5–15.5)
WBC: 6.6 10*3/uL (ref 4.0–10.5)

## 2015-01-14 NOTE — Patient Instructions (Addendum)
Medication Instructions:  1. INCREASE VALSARTAN TO 80 MG DAILY  Labwork: 1. YOU HAVE BEEN GIVEN AN RX FOR LAB WORK TO BE DONE IN 1-2 WEEKS IN [place]; PLEASE HAVE THE RESULTS FAXED TO [place], [place]  2. TODAY CBC W/DIFF  Testing/Procedures: NONE  Follow-Up: PER SCOTT WEAVER, PAC YOU MAY FOLLOW UP WITH ONE OF OUR CARDIOLOGIST IN THE [place] OFFICE IN 2-3 MONTHS; DUE TO TRANSPORTATION  Any Other Special Instructions Will Be Listed Below (If Applicable).

## 2015-01-14 NOTE — Telephone Encounter (Signed)
pt did not have hearing aids in so wife notified of lab results and recommendation to have pt f/u w/PCP in reference to platelets mildly depressed. Wife verbalized understanding to plan of care.

## 2015-01-15 NOTE — Addendum Note (Signed)
Addended by: Michae Kava on: 01/15/2015 02:42 PM   Modules accepted: Orders

## 2015-01-15 NOTE — Telephone Encounter (Signed)
wife notified per Dr. Johnsie Cancel and Brynda Rim. PA that pt will need to have the Cardiac MRI as that was discussed at [facility] yesterday with [facility]. Wife aware our office will call to schedule MRI to be done at [facility]. Wife verbalized understanding.

## 2015-01-17 ENCOUNTER — Encounter: Payer: Self-pay | Admitting: Cardiovascular Disease

## 2015-01-28 ENCOUNTER — Encounter: Payer: Self-pay | Admitting: Physician Assistant

## 2015-01-29 ENCOUNTER — Ambulatory Visit (HOSPITAL_COMMUNITY)
Admission: RE | Admit: 2015-01-29 | Discharge: 2015-01-29 | Disposition: A | Discharge status: Home / Self Care | Payer: Medicare Other | Source: Ambulatory Visit | Attending: Cardiovascular Disease | Admitting: Cardiovascular Disease

## 2015-01-29 ENCOUNTER — Telehealth: Payer: Self-pay

## 2015-01-29 DIAGNOSIS — I25110 Atherosclerotic heart disease of native coronary artery with unstable angina pectoris: Secondary | ICD-10-CM

## 2015-01-29 DIAGNOSIS — I255 Ischemic cardiomyopathy: Secondary | ICD-10-CM | POA: Diagnosis not present

## 2015-01-29 DIAGNOSIS — I2510 Atherosclerotic heart disease of native coronary artery without angina pectoris: Secondary | ICD-10-CM | POA: Diagnosis not present

## 2015-01-29 MED ORDER — GADOBENATE DIMEGLUMINE 529 MG/ML IV SOLN
30.0000 mL | Freq: Once | INTRAVENOUS | Status: AC | PRN
  Administered 2015-01-29: 30 mL via INTRAVENOUS

## 2015-01-29 NOTE — Telephone Encounter (Signed)
s/w wife on cell #. Wife indicated pt. driving and HOH and she would give results to him. Results given to wife. Verbalized understanding with no further questions.

## 2015-04-07 ENCOUNTER — Other Ambulatory Visit: Payer: Self-pay | Admitting: Urology

## 2015-04-07 DIAGNOSIS — N289 Disorder of kidney and ureter, unspecified: Secondary | ICD-10-CM

## 2015-04-14 ENCOUNTER — Ambulatory Visit (HOSPITAL_COMMUNITY)
Admission: RE | Admit: 2015-04-14 | Discharge: 2015-04-14 | Disposition: A | Discharge status: Home / Self Care | Payer: Medicare Other | Source: Ambulatory Visit | Attending: Urology | Admitting: Urology

## 2015-04-14 DIAGNOSIS — K8020 Calculus of gallbladder without cholecystitis without obstruction: Secondary | ICD-10-CM | POA: Insufficient documentation

## 2015-04-14 DIAGNOSIS — N289 Disorder of kidney and ureter, unspecified: Secondary | ICD-10-CM

## 2015-04-14 DIAGNOSIS — K760 Fatty (change of) liver, not elsewhere classified: Secondary | ICD-10-CM | POA: Insufficient documentation

## 2015-04-14 DIAGNOSIS — R109 Unspecified abdominal pain: Secondary | ICD-10-CM | POA: Insufficient documentation

## 2015-04-14 DIAGNOSIS — N281 Cyst of kidney, acquired: Secondary | ICD-10-CM | POA: Insufficient documentation

## 2015-04-14 LAB — POCT I-STAT CREATININE: Creatinine, Ser: 0.8 mg/dL (ref 0.61–1.24)

## 2015-04-14 MED ORDER — GADOBENATE DIMEGLUMINE 529 MG/ML IV SOLN
20.0000 mL | Freq: Once | INTRAVENOUS | Status: AC | PRN
Start: 2015-04-14 — End: 2015-04-14
  Administered 2015-04-14: 20 mL via INTRAVENOUS

## 2015-04-16 ENCOUNTER — Ambulatory Visit (INDEPENDENT_AMBULATORY_CARE_PROVIDER_SITE_OTHER): Payer: Medicare Other | Admitting: Cardiology

## 2015-04-16 VITALS — BP 164/72 | HR 51 | Ht 69.0 in | Wt 219.4 lb

## 2015-04-16 DIAGNOSIS — E785 Hyperlipidemia, unspecified: Secondary | ICD-10-CM | POA: Diagnosis not present

## 2015-04-16 DIAGNOSIS — I25110 Atherosclerotic heart disease of native coronary artery with unstable angina pectoris: Secondary | ICD-10-CM

## 2015-04-16 DIAGNOSIS — I255 Ischemic cardiomyopathy: Secondary | ICD-10-CM

## 2015-04-16 DIAGNOSIS — I10 Essential (primary) hypertension: Secondary | ICD-10-CM

## 2015-04-16 MED ORDER — CARVEDILOL 6.25 MG PO TABS: 6.2500 mg | ORAL_TABLET | Freq: Two times a day (BID) | ORAL | Status: DC

## 2015-04-16 MED ORDER — ATORVASTATIN CALCIUM 80 MG PO TABS: 80.0000 mg | ORAL_TABLET | Freq: Every day | ORAL | Status: DC

## 2015-04-16 MED ORDER — VALSARTAN 80 MG PO TABS: 80.0000 mg | ORAL_TABLET | Freq: Every day | ORAL | Status: DC

## 2015-04-16 MED ORDER — ISOSORBIDE MONONITRATE ER 30 MG PO TB24: 30.0000 mg | ORAL_TABLET | Freq: Every day | ORAL | Status: DC

## 2015-04-16 NOTE — Patient Instructions (Addendum)
Your physician wants you to follow-up in: [place] DR. BRANCH You will receive a reminder letter in the mail two months in advance. If you don't receive a letter, please call our office to schedule the follow-up appointment.  Your physician has recommended you make the following change in your medication:   STOP SIMVASTATIN   START ATORVASTATIN 80 MG DAILY  Your physician has requested that you regularly monitor and record your blood pressure readings at home FOR 7 DAYS AND CALL RESULTS TO OUR OFFICE Please use the same machine at the same time of day to check your readings and record them to bring to your follow-up visit.  Thank you for choosing [facility]!!

## 2015-04-16 NOTE — Progress Notes (Signed)
Patient ID: Troy [name], male   DOB: 1941-03-31, 74 y.o.   MRN: [id number]     Clinical Summary Troy [name] is a 75 y.o.male last seen by PA Kathlen Mody, this is our first visit together. He is seen for the following medical problems.  1. CAD - hx of CABG in 1996 - cath 12/2014 showed patent LM, LAD occluded, LCZ 70% mid, RCA 80% prox. LIMA-LAD patent SVG-OM occluded, SVG-ramus patent. LVEF 35-40%. Managed medically, with recs if symptoms on medical therapy can consider LCX intervetntion - 12/2014 echo LVEF 83-41%, grade I diastolic dysfunction  - denies any recent chest pain. No SOB or DOE - compliant with meds   2. HTN - compliant with meds - does not check at home  3. Hyperlipidemia 12/2014 TC 11 TG 91 HDL 28 LDL 65 - compliant with statin   Past Medical History  Diagnosis Date  . CAD (coronary artery disease)     a. s/p CABG in 1996;  b.  Nuclear 2/11:  EF 34%,  large anterior and apical scar. There is mild peri-infarct ischemia;  c.  LHC 4/16:  LAD occld, LCx mid 70%, OM Hulon Ferron occl, RCA 80% prox, occl mid, L-LAD ok, S-OM/dCFX 100%, S-RI ok, EF 35-40% >> med Rx - PCI of CFX if refractory angina  . Diabetes mellitus   . Hypertension   . PAD (peripheral artery disease)     Dr. Oneida Alar in the past  . Myocardial infarction 1996  . HLD (hyperlipidemia)   . GERD (gastroesophageal reflux disease)   . Hard of hearing     has hearing aids /BIL  . Thrombocytopenia   . Ischemic cardiomyopathy     a. eval by EP in 2011 - no ICD needed at that time;  b.  Echo 4/16: inf and inf-lat HK, EF 50-55%, Gr 1 DD;  c. LHC 4/16: EF 35-40%     No Known Allergies   Current Outpatient Prescriptions  Medication Sig Dispense Refill  . Ascorbic Acid (VITAMIN C) 1000 MG tablet Take 1,000 mg by mouth daily.      Marland Kitchen aspirin 81 MG EC tablet Take 81 mg by mouth daily.      . Calcium Carbonate-Vitamin D (OYSTER SHELL CALCIUM 500 + D PO) Take 1 tablet by mouth daily.      . carvedilol (COREG) 6.25 MG  tablet Take 1 tablet (6.25 mg total) by mouth 2 (two) times daily with a meal. 60 tablet 11  . fish oil-omega-3 fatty acids 1000 MG capsule Take 2 g by mouth daily.      Marland Kitchen glimepiride (AMARYL) 2 MG tablet Take 2 mg by mouth daily before breakfast.      . isosorbide mononitrate (IMDUR) 30 MG 24 hr tablet Take 1 tablet (30 mg total) by mouth daily. 30 tablet 11  . nitroGLYCERIN (NITROLINGUAL) 0.4 MG/SPRAY spray Place 1 spray under the tongue every 5 (five) minutes x 3 doses as needed for chest pain. 12 g 12  . Saxagliptin-Metformin (KOMBIGLYZE XR) 01-999 MG TB24 Take 1 tablet by mouth daily.     . simvastatin (ZOCOR) 20 MG tablet Take 20 mg by mouth every evening.     . valsartan (DIOVAN) 80 MG tablet Take 80 mg by mouth daily.    Marland Kitchen zolpidem (AMBIEN) 10 MG tablet Take 1 tablet by mouth at bedtime.      No current facility-administered medications for this visit.     Past Surgical History  Procedure Laterality Date  .  Colonoscopy    . Coronary artery bypass graft  1996    5 vessels  . Cataract extraction w/phaco Right 08/06/2013    Procedure: CATARACT EXTRACTION PHACO AND INTRAOCULAR LENS PLACEMENT RIGHT EYE;  Surgeon: Tonny Edwardo Wojnarowski, MD;  Location: [facility];  Service: Ophthalmology;  Laterality: Right;  CDE:  15.48  . Hernia repair      groin-25 yrs ago  . Colonoscopy with propofol N/A 10/22/2013    Procedure: COLONOSCOPY WITH PROPOFOL;  Surgeon: Irene Shipper, MD;  Location: [facility];  Service: Endoscopy;  Laterality: N/A;  . Left heart catheterization with coronary/graft angiogram N/A 01/06/2015    Procedure: LEFT HEART CATHETERIZATION WITH Beatrix Fetters;  Surgeon: Troy Sine, MD;  Location: [facility] CATH LAB;  Service: Cardiovascular;  Laterality: N/A;     No Known Allergies    Family History  Problem Relation Age of Onset  . Heart disease Mother   . Heart disease Father   . Cancer Brother   . Heart attack Mother   . Heart attack Father   . Heart attack Sister   . Heart  attack Brother   . Stroke Neg Hx   . Diabetes Brother   . Diabetes Sister   . Hypertension Sister   . Hypertension Brother   . Hypertension Father   . Hypertension Mother      Social History Troy [name] reports that he quit smoking about 24 years ago. His smoking use included Cigarettes. He has a 30 pack-year smoking history. He has never used smokeless tobacco. Troy [name] reports that he does not drink alcohol.   Review of Systems CONSTITUTIONAL: No weight loss, fever, chills, weakness or fatigue.  HEENT: Eyes: No visual loss, blurred vision, double vision or yellow sclerae.No hearing loss, sneezing, congestion, runny nose or sore throat.  SKIN: No rash or itching.  CARDIOVASCULAR: per HPI RESPIRATORY: No shortness of breath, cough or sputum.  GASTROINTESTINAL: No anorexia, nausea, vomiting or diarrhea. No abdominal pain or blood.  GENITOURINARY: No burning on urination, no polyuria NEUROLOGICAL: No headache, dizziness, syncope, paralysis, ataxia, numbness or tingling in the extremities. No change in bowel or bladder control.  MUSCULOSKELETAL: No muscle, back pain, joint pain or stiffness.  LYMPHATICS: No enlarged nodes. No history of splenectomy.  PSYCHIATRIC: No history of depression or anxiety.  ENDOCRINOLOGIC: No reports of sweating, cold or heat intolerance. No polyuria or polydipsia.  Marland Kitchen   Physical Examination Filed Vitals:   04/16/15 0857  BP: 164/72  Pulse: 51   Filed Vitals:   04/16/15 0857  Height: 5\' 9"  (1.753 m)  Weight: 219 lb 6.4 oz (99.519 kg)    Gen: resting comfortably, no acute distress HEENT: no scleral icterus, pupils equal round and reactive, no palptable cervical adenopathy,  CV: RRR, no m/r/g, no JVD Resp: Clear to auscultation bilaterally GI: abdomen is soft, non-tender, non-distended, normal bowel sounds, no hepatosplenomegaly MSK: extremities are warm, no edema.  Skin: warm, no rash Neuro:  no focal deficits Psych: appropriate  affect   Diagnostic Studies 12/2014 Cath  HEMODYNAMICS:  Central Aorta: 170/59  Left Ventricle: 170/19  ANGIOGRAPHY:  Left main: Normal vessel which bifurcated into the LAD and left circumflex coronary artery  LAD: Moderate size proximal vessel that was totally occluded after the first septal and diagonal vessel.  Left circumflex: good size vessel that had 70% mid stenosis before a small marginal Liberti Appleton. The distal vessel was small caliber and there was retrograde filling of a previous sequential graft most likely between  the small marginal and distal circumflex.  Right coronary artery: Moderate size vessel with calcification and 80% proximal stenosis with a small area of probable thrombus in its midsegment prior to total mid occlusion.  LIMA to LAD: Widely patent and anastomosed into the mid LAD. There is retrograde filling to the proximal occlusion. The anastomosis was free of significant disease extended to the apex.  SVG sequential graft supplying a marginal and distal circumflex was totally occluded at its origin.  SVG E high marginal or ramus intermediate vessel was widely patent. There was mild 20% proximal smooth narrowing.   Left ventriculography revealed global LV dysfunction with an ejection fraction of 35 to less than 40%.   IMPRESSION:  Ischemic cardiomyopathy with an ejection fraction of 35 to less than 40% with diffuse global hypocontractility.  Significant native coronary obstructive disease with total occlusion of the proximal LAD after the septal diagonal vessel; 70% stenosis in the proximal AV groove circumflex with occlusion of the native OM Rain [place] and retrograde filling of the sequential limb distally to this mid Donney Caraveo and total occlusion of the mid right coronary artery with 80% calcified proximal stenosis and small area of thrombus in the mid RCA prior to the total occlusion.  Occluded vein graft which most likely was the sequential graft supplying  an OM and distal circumflex vessel the circumflex coronary artery.  Patent SVG which seems to supply a high marginal or ramus intermediate-like vessel this 20% proximal narrowing in the graft.  Patent SVG to the PDA Edwin Cherian of the RCA.  Patent LIMA graft to the mid LAD.  RECOMMENDATION:  The patient has an occluded graft which most likely sequentially supplied a small OM and distal circumflex vessel. 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 Platelets today are low at 81,000. He has not been on any significant antianginal regimen. Initial medical therapy will be recommended with close follow-up of his platelets. If recurrent symptomatology develops consider possible PCI to the native circumflex   Assessment and Plan  1. CAD - no current symptoms - cntinue current meds  2. HTN - elevated in clinic, he reports typically well controlled at his visits. He will keep bp log x 7 days and submit, titrate bp meds accordingly  3. Hyperlipidemia - in stting of known CAD change to high dose statin, atorva 80mg    F/u 6 months      [name], M.D.

## 2015-04-18 ENCOUNTER — Encounter: Payer: Self-pay | Admitting: Cardiology

## 2015-04-23 ENCOUNTER — Telehealth: Payer: Self-pay | Admitting: *Deleted

## 2015-04-23 MED ORDER — VALSARTAN 160 MG PO TABS: 160.0000 mg | ORAL_TABLET | Freq: Every day | ORAL | Status: DC

## 2015-04-23 NOTE — Telephone Encounter (Signed)
Pt called with BP log: Will forward to Dr. Harl Bowie 155/70 154/71 145/70 169/73 157/72 140/72 145/77

## 2015-04-23 NOTE — Telephone Encounter (Signed)
BP is too high. Please confirm he is taking valsartan 80mg  daily and increase to 160mg  daily. Have him keep bp log x 2 weeks and call with results again.   Zandra Abts MD

## 2015-04-23 NOTE — Telephone Encounter (Signed)
Spoke with wife,as pt was driving car.Pt is on Diovan 80 mg and will increase to 160 mg daily and call back with BP readings in 2 weeks

## 2015-05-12 ENCOUNTER — Telehealth: Payer: Self-pay | Admitting: Cardiology

## 2015-05-12 NOTE — Telephone Encounter (Signed)
Patient stated that he is returning a call he received on Friday

## 2015-05-12 NOTE — Telephone Encounter (Signed)
Patient returned call

## 2015-05-12 NOTE — Telephone Encounter (Signed)
Opened in error

## 2015-05-13 NOTE — Telephone Encounter (Signed)
Addressed in previous phone note.  

## 2015-06-04 ENCOUNTER — Telehealth: Payer: Self-pay | Admitting: Cardiology

## 2015-06-04 MED ORDER — ISOSORBIDE MONONITRATE ER 30 MG PO TB24: 30.0000 mg | ORAL_TABLET | Freq: Every day | ORAL | Status: DC

## 2015-06-04 NOTE — Telephone Encounter (Signed)
Pt requesting 90 day supply of isosorbide 30 mg. Medication sent to pharmacy.

## 2015-06-04 NOTE — Telephone Encounter (Signed)
Troy [name] called stating that he would like his heart medication called to [facility], [place]  With a 90 day supply.

## 2015-09-30 ENCOUNTER — Encounter: Payer: Self-pay | Admitting: *Deleted

## 2015-09-30 ENCOUNTER — Ambulatory Visit (INDEPENDENT_AMBULATORY_CARE_PROVIDER_SITE_OTHER): Payer: Medicare Other | Admitting: Cardiology

## 2015-09-30 ENCOUNTER — Encounter: Payer: Self-pay | Admitting: Cardiology

## 2015-09-30 VITALS — BP 188/67 | HR 52 | Ht 69.0 in | Wt 219.0 lb

## 2015-09-30 DIAGNOSIS — I2510 Atherosclerotic heart disease of native coronary artery without angina pectoris: Secondary | ICD-10-CM

## 2015-09-30 DIAGNOSIS — E785 Hyperlipidemia, unspecified: Secondary | ICD-10-CM | POA: Diagnosis not present

## 2015-09-30 DIAGNOSIS — I10 Essential (primary) hypertension: Secondary | ICD-10-CM

## 2015-09-30 MED ORDER — VALSARTAN 160 MG PO TABS: 320.0000 mg | ORAL_TABLET | Freq: Every day | ORAL | Status: DC

## 2015-09-30 NOTE — Patient Instructions (Signed)
Your physician wants you to follow-up in: [place] DR. BRANCH You will receive a reminder letter in the mail two months in advance. If you don't receive a letter, please call our office to schedule the follow-up appointment.  Your physician has recommended you make the following change in your medication:   INCREASE VALSARTAN 320 MG DAILY   Your physician recommends that you return for lab work in: [place] has requested that you regularly monitor and record your blood pressure readings at home FOR 2 WEEKS AND CALL us WITH RESULTS Please use the same machine at the same time of day to check your readings and record them to bring to your follow-up visit.  Thank you for choosing [facility]!!

## 2015-09-30 NOTE — Progress Notes (Signed)
Patient ID: KASAAN SPAMPINATO, male   DOB: 1941/07/27, 75 y.o.   MRN: [id number]     Clinical Summary Mr. Gurkin is a 75 y.o.male seen today for follow up of the following medical problems.   1. CAD - hx of CABG in 1996 - cath 12/2014 showed patent LM, LAD occluded, LCX 70% mid, RCA 80% prox. LIMA-LAD patent SVG-OM occluded, SVG-ramus patent. LVEF 35-40%. Managed medically, with recs if symptoms on medical therapy can consider LCX intervetntion - 12/2014 echo LVEF 99991111, grade I diastolic dysfunction  - denies any chest pain since our last visit. No SOB or DOE - compliant with meds   2. HTN - compliant with meds - home bp's typically 140-160s/60s   3. Hyperlipidemia 12/2014 TC 11 TG 91 HDL 28 LDL 65 - compliant with statin Past Medical History  Diagnosis Date  . CAD (coronary artery disease)     a. s/p CABG in 1996;  b.  Nuclear 2/11:  EF 34%,  large anterior and apical scar. There is mild peri-infarct ischemia;  c.  LHC 4/16:  LAD occld, LCx mid 70%, OM Laiken Nohr occl, RCA 80% prox, occl mid, L-LAD ok, S-OM/dCFX 100%, S-RI ok, EF 35-40% >> med Rx - PCI of CFX if refractory angina  . Diabetes mellitus   . Hypertension   . PAD (peripheral artery disease)     Dr. Oneida Alar in the past  . Myocardial infarction ([place]) 1996  . HLD (hyperlipidemia)   . GERD (gastroesophageal reflux disease)   . Hard of hearing     has hearing aids /BIL  . Thrombocytopenia   . Ischemic cardiomyopathy     a. eval by EP in 2011 - no ICD needed at that time;  b.  Echo 4/16: inf and inf-lat HK, EF 50-55%, Gr 1 DD;  c. LHC 4/16: EF 35-40%     No Known Allergies   Current Outpatient Prescriptions  Medication Sig Dispense Refill  . Ascorbic Acid (VITAMIN C) 1000 MG tablet Take 1,000 mg by mouth daily.      Marland Kitchen aspirin 81 MG EC tablet Take 81 mg by mouth daily.      Marland Kitchen atorvastatin (LIPITOR) 80 MG tablet Take 1 tablet (80 mg total) by mouth daily. 90 tablet 3  . Calcium Carbonate-Vitamin D (OYSTER SHELL CALCIUM  500 + D PO) Take 1 tablet by mouth daily.      . carvedilol (COREG) 6.25 MG tablet Take 1 tablet (6.25 mg total) by mouth 2 (two) times daily with a meal. 180 tablet 3  . fish oil-omega-3 fatty acids 1000 MG capsule Take 2 g by mouth daily.      Marland Kitchen glimepiride (AMARYL) 2 MG tablet Take 2 mg by mouth daily before breakfast.      . isosorbide mononitrate (IMDUR) 30 MG 24 hr tablet Take 1 tablet (30 mg total) by mouth daily. 90 tablet 3  . nitroGLYCERIN (NITROLINGUAL) 0.4 MG/SPRAY spray Place 1 spray under the tongue every 5 (five) minutes x 3 doses as needed for chest pain. 12 g 12  . Saxagliptin-Metformin (KOMBIGLYZE XR) 01-999 MG TB24 Take 1 tablet by mouth daily.     . valsartan (DIOVAN) 160 MG tablet Take 1 tablet (160 mg total) by mouth daily. 90 tablet 3  . zolpidem (AMBIEN) 10 MG tablet Take 1 tablet by mouth at bedtime.      No current facility-administered medications for this visit.     Past Surgical History  Procedure Laterality Date  .  Colonoscopy    . Coronary artery bypass graft  1996    5 vessels  . Cataract extraction w/phaco Right 08/06/2013    Procedure: CATARACT EXTRACTION PHACO AND INTRAOCULAR LENS PLACEMENT RIGHT EYE;  Surgeon: Tonny Traylen Eckels, MD;  Location: [facility];  Service: Ophthalmology;  Laterality: Right;  CDE:  15.48  . Hernia repair      groin-25 yrs ago  . Colonoscopy with propofol N/A 10/22/2013    Procedure: COLONOSCOPY WITH PROPOFOL;  Surgeon: Irene Shipper, MD;  Location: [facility];  Service: Endoscopy;  Laterality: N/A;  . Left heart catheterization with coronary/graft angiogram N/A 01/06/2015    Procedure: LEFT HEART CATHETERIZATION WITH Beatrix Fetters;  Surgeon: Troy Sine, MD;  Location: [facility] CATH LAB;  Service: Cardiovascular;  Laterality: N/A;     No Known Allergies    Family History  Problem Relation Age of Onset  . Heart disease Mother   . Heart disease Father   . Cancer Brother   . Heart attack Mother   . Heart attack Father   .  Heart attack Sister   . Heart attack Brother   . Stroke Neg Hx   . Diabetes Brother   . Diabetes Sister   . Hypertension Sister   . Hypertension Brother   . Hypertension Father   . Hypertension Mother      Social History Mr. Pensabene reports that he quit smoking about 25 years ago. His smoking use included Cigarettes. He has a 30 pack-year smoking history. He has never used smokeless tobacco. Mr. Holsopple reports that he does not drink alcohol.   Review of Systems CONSTITUTIONAL: No weight loss, fever, chills, weakness or fatigue.  HEENT: Eyes: No visual loss, blurred vision, double vision or yellow sclerae.No hearing loss, sneezing, congestion, runny nose or sore throat.  SKIN: No rash or itching.  CARDIOVASCULAR: per hpi RESPIRATORY: No shortness of breath, cough or sputum.  GASTROINTESTINAL: No anorexia, nausea, vomiting or diarrhea. No abdominal pain or blood.  GENITOURINARY: No burning on urination, no polyuria NEUROLOGICAL: No headache, dizziness, syncope, paralysis, ataxia, numbness or tingling in the extremities. No change in bowel or bladder control.  MUSCULOSKELETAL: No muscle, back pain, joint pain or stiffness.  LYMPHATICS: No enlarged nodes. No history of splenectomy.  PSYCHIATRIC: No history of depression or anxiety.  ENDOCRINOLOGIC: No reports of sweating, cold or heat intolerance. No polyuria or polydipsia.  Marland Kitchen   Physical Examination Filed Vitals:   09/30/15 1133  BP: 188/67  Pulse: 52   Filed Vitals:   09/30/15 1133  Height: 5\' 9"  (1.753 m)  Weight: 219 lb (99.338 kg)    Gen: resting comfortably, no acute distress HEENT: no scleral icterus, pupils equal round and reactive, no palptable cervical adenopathy,  CV: RRR, no m/r/g, no jvd  Resp: Clear to auscultation bilaterally GI: abdomen is soft, non-tender, non-distended, normal bowel sounds, no hepatosplenomegaly MSK: extremities are warm, no edema.  Skin: warm, no rash Neuro:  no focal deficits Psych:  appropriate affect   Diagnostic Studies 12/2014 Cath  HEMODYNAMICS:  Central Aorta: 170/59  Left Ventricle: 170/19  ANGIOGRAPHY:  Left main: Normal vessel which bifurcated into the LAD and left circumflex coronary artery  LAD: Moderate size proximal vessel that was totally occluded after the first septal and diagonal vessel.  Left circumflex: good size vessel that had 70% mid stenosis before a small marginal Arden Axon. The distal vessel was small caliber and there was retrograde filling of a previous sequential graft most likely between the  small marginal and distal circumflex.  Right coronary artery: Moderate size vessel with calcification and 80% proximal stenosis with a small area of probable thrombus in its midsegment prior to total mid occlusion.  LIMA to LAD: Widely patent and anastomosed into the mid LAD. There is retrograde filling to the proximal occlusion. The anastomosis was free of significant disease extended to the apex.  SVG sequential graft supplying a marginal and distal circumflex was totally occluded at its origin.  SVG E high marginal or ramus intermediate vessel was widely patent. There was mild 20% proximal smooth narrowing.   Left ventriculography revealed global LV dysfunction with an ejection fraction of 35 to less than 40%.   IMPRESSION:  Ischemic cardiomyopathy with an ejection fraction of 35 to less than 40% with diffuse global hypocontractility.  Significant native coronary obstructive disease with total occlusion of the proximal LAD after the septal diagonal vessel; 70% stenosis in the proximal AV groove circumflex with occlusion of the native OM Ivyonna Hoelzel and retrograde filling of the sequential limb distally to this mid Crysten Kaman and total occlusion of the mid right coronary artery with 80% calcified proximal stenosis and small area of thrombus in the mid RCA prior to the total occlusion.  Occluded vein graft which most likely was the sequential graft  supplying an OM and distal circumflex vessel the circumflex coronary artery.  Patent SVG which seems to supply a high marginal or ramus intermediate-like vessel this 20% proximal narrowing in the graft.  Patent SVG to the PDA Jacquese Cassarino of the RCA.  Patent LIMA graft to the mid LAD.  RECOMMENDATION:  The patient has an occluded graft which most likely sequentially supplied a small OM and distal circumflex vessel. 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 Platelets today are low at 81,000. He has not been on any significant antianginal regimen. Initial medical therapy will be recommended with close follow-up of his platelets. If recurrent symptomatology develops consider possible PCI to the native circumflex    Assessment and Plan  1. CAD - no recent symptoms - cntinue current meds  2. HTN - above his goal of <130/80 given his history of DM2 - we will increase his valsartan to 320mg  daily, check BMET in 2 weeks. He will submit bp log in 2 weeks.   3. Hyperlipidemia - in stting of known CAD continue high dose statin  F/u 6 months. Request pcp labs.       .D., F.A.C.C. [name], M.D.

## 2015-10-17 ENCOUNTER — Telehealth: Payer: Self-pay | Admitting: *Deleted

## 2015-10-17 NOTE — Telephone Encounter (Signed)
Pt aware, routed to pcp 

## 2015-10-17 NOTE — Telephone Encounter (Signed)
-----   Message from [name], MD sent at 10/17/2015 11:45 AM EST ----- Labs look good  Zandra Abts MD

## 2015-12-24 ENCOUNTER — Telehealth: Payer: Self-pay | Admitting: Cardiovascular Disease

## 2015-12-24 NOTE — Telephone Encounter (Signed)
Patient walked into office. He took himself off of Lipitor and started by simistatin due to his joints hurting   Would like someone to call him

## 2015-12-24 NOTE — Telephone Encounter (Signed)
Pt says stopped Lipitor on Sunday due to shoulder aches, started taking simvastatin 40 mg. Says the aches have improved but not stopped. Will forward to Dr. Bronson Ing

## 2015-12-25 MED ORDER — SIMVASTATIN 40 MG PO TABS: 40.0000 mg | ORAL_TABLET | Freq: Every day | ORAL | Status: DC

## 2015-12-25 NOTE — Telephone Encounter (Signed)
Ok to stop lipitor, can change to simva 40mg d daily if he tolerates that better   J Rajanee Schuelke MD

## 2015-12-25 NOTE — Telephone Encounter (Signed)
Med list updated and pt requesting refill on simvastatin 40 mg. Medication sent to pharmacy.

## 2016-04-05 ENCOUNTER — Encounter: Payer: Self-pay | Admitting: *Deleted

## 2016-04-05 ENCOUNTER — Ambulatory Visit (INDEPENDENT_AMBULATORY_CARE_PROVIDER_SITE_OTHER): Payer: Medicare Other | Admitting: Cardiology

## 2016-04-05 ENCOUNTER — Encounter: Payer: Self-pay | Admitting: Cardiology

## 2016-04-05 VITALS — BP 178/68 | HR 57 | Ht 69.0 in | Wt 214.0 lb

## 2016-04-05 DIAGNOSIS — I2510 Atherosclerotic heart disease of native coronary artery without angina pectoris: Secondary | ICD-10-CM | POA: Diagnosis not present

## 2016-04-05 DIAGNOSIS — I10 Essential (primary) hypertension: Secondary | ICD-10-CM | POA: Diagnosis not present

## 2016-04-05 DIAGNOSIS — E785 Hyperlipidemia, unspecified: Secondary | ICD-10-CM | POA: Diagnosis not present

## 2016-04-05 MED ORDER — AMLODIPINE BESYLATE 5 MG PO TABS: 5.0000 mg | ORAL_TABLET | Freq: Every day | ORAL | Status: DC

## 2016-04-05 NOTE — Progress Notes (Addendum)
Clinical Summary Troy [name] is a 75 y.o.male seen today for follow up of the following medical problems.   1. CAD - hx of CABG in 1996 - cath 12/2014 showed patent LM, LAD occluded, LCX 70% mid, RCA 80% prox. LIMA-LAD patent SVG-OM occluded, SVG-ramus patent. LVEF 35-40%. Managed medically, with recs if symptoms on medical therapy can consider LCX intervetntion - 12/2014 echo LVEF 99991111, grade I diastolic dysfunction   - denies any chest pain. No DOE or SOB - compliant with meds  2. HTN - compliant with meds - home bp's typically 140-150s/ 70-80s   3. Hyperlipidemia - did not tolerate lipitor, back on simvastatin.       SH: just got back from [facility] Past Medical History  Diagnosis Date  . CAD (coronary artery disease)     a. s/p CABG in 1996;  b.  Nuclear 2/11:  EF 34%,  large anterior and apical scar. There is mild peri-infarct ischemia;  c.  LHC 4/16:  LAD occld, LCx mid 70%, OM branch occl, RCA 80% prox, occl mid, L-LAD ok, S-OM/dCFX 100%, S-RI ok, EF 35-40% >> med Rx - PCI of CFX if refractory angina  . Diabetes mellitus   . Hypertension   . PAD (peripheral artery disease) ([facility])     Dr. Oneida Alar in the past  . Myocardial infarction ([place]) 1996  . HLD (hyperlipidemia)   . GERD (gastroesophageal reflux disease)   . Hard of hearing     has hearing aids /BIL  . Thrombocytopenia ([place])   . Ischemic cardiomyopathy     a. eval by EP in 2011 - no ICD needed at that time;  b.  Echo 4/16: inf and inf-lat HK, EF 50-55%, Gr 1 DD;  c. LHC 4/16: EF 35-40%     No Known Allergies   Current Outpatient Prescriptions  Medication Sig Dispense Refill  . Ascorbic Acid (VITAMIN C) 1000 MG tablet Take 1,000 mg by mouth daily.      Marland Kitchen aspirin 81 MG EC tablet Take 81 mg by mouth daily.      . Calcium Carbonate-Vitamin D (OYSTER SHELL CALCIUM 500 + D PO) Take 1 tablet by mouth daily.      . carvedilol (COREG) 6.25 MG tablet Take 1 tablet (6.25 mg total) by mouth 2 (two)  times daily with a meal. 180 tablet 3  . fish oil-omega-3 fatty acids 1000 MG capsule Take 2 g by mouth daily.      Marland Kitchen glimepiride (AMARYL) 2 MG tablet Take 2 mg by mouth daily before breakfast.      . isosorbide mononitrate (IMDUR) 30 MG 24 hr tablet Take 1 tablet (30 mg total) by mouth daily. 90 tablet 3  . nitroGLYCERIN (NITROLINGUAL) 0.4 MG/SPRAY spray Place 1 spray under the tongue every 5 (five) minutes x 3 doses as needed for chest pain. 12 g 12  . Saxagliptin-Metformin (KOMBIGLYZE XR) 01-999 MG TB24 Take 1 tablet by mouth daily.     . simvastatin (ZOCOR) 40 MG tablet Take 1 tablet (40 mg total) by mouth daily. 90 tablet 3  . valsartan (DIOVAN) 160 MG tablet Take 2 tablets (320 mg total) by mouth daily. 180 tablet 3  . zolpidem (AMBIEN) 10 MG tablet Take 1 tablet by mouth at bedtime.      No current facility-administered medications for this visit.     Past Surgical History  Procedure Laterality Date  . Colonoscopy    . Coronary artery bypass graft  1996    5 vessels  . Cataract extraction w/phaco Right 08/06/2013    Procedure: CATARACT EXTRACTION PHACO AND INTRAOCULAR LENS PLACEMENT RIGHT EYE;  Surgeon: Tonny Branch, MD;  Location: [facility];  Service: Ophthalmology;  Laterality: Right;  CDE:  15.48  . Hernia repair      groin-25 yrs ago  . Colonoscopy with propofol N/A 10/22/2013    Procedure: COLONOSCOPY WITH PROPOFOL;  Surgeon: Irene Shipper, MD;  Location: [facility];  Service: Endoscopy;  Laterality: N/A;  . Left heart catheterization with coronary/graft angiogram N/A 01/06/2015    Procedure: LEFT HEART CATHETERIZATION WITH Beatrix Fetters;  Surgeon: Troy Sine, MD;  Location[facility] C[facility] LAB;  Service: Cardiovascular;  Laterality: N/A;     No Known Allergies    Family History  Problem Relation Age of Onset  . Heart disease Mother   . Heart disease Father   . Cancer Brother   . Heart attack Mother   . Heart attack Father   . Heart attack Sister   . Heart  attack Brother   . Stroke Neg Hx   . Diabetes Brother   . Diabetes Sister   . Hypertension Sister   . Hypertension Brother   . Hypertension Father   . Hypertension Mother      Social History Troy [name] reports that he quit smoking about 25 years ago. His smoking use included Cigarettes. He started smoking about 58 years ago. He has a 30 pack-year smoking history. He has never used smokeless tobacco. Troy [name] reports that he does not drink alcohol.   Review of Systems CONSTITUTIONAL: No weight loss, fever, chills, weakness or fatigue.  HEENT: Eyes: No visual loss, blurred vision, double vision or yellow sclerae.No hearing loss, sneezing, congestion, runny nose or sore throat.  SKIN: No rash or itching.  CARDIOVASCULAR: per HPI RESPIRATORY: No shortness of breath, cough or sputum.  GASTROINTESTINAL: No anorexia, nausea, vomiting or diarrhea. No abdominal pain or blood.  GENITOURINARY: No burning on urination, no polyuria NEUROLOGICAL: No headache, dizziness, syncope, paralysis, ataxia, numbness or tingling in the extremities. No change in bowel or bladder control.  MUSCULOSKELETAL: No muscle, back pain, joint pain or stiffness.  LYMPHATICS: No enlarged nodes. No history of splenectomy.  PSYCHIATRIC: No history of depression or anxiety.  ENDOCRINOLOGIC: No reports of sweating, cold or heat intolerance. No polyuria or polydipsia.  Marland Kitchen   Physical Examination Filed Vitals:   04/05/16 1058  BP: 178/68  Pulse: 57   Filed Vitals:   04/05/16 1058  Height: 5\' 9"  (1.753 m)  Weight: 214 lb (97.07 kg)    Gen: resting comfortably, no acute distress HEENT: no scleral icterus, pupils equal round and reactive, no palptable cervical adenopathy,  CV: RRR, 2/6 systolic murmur RUSB, no jvd Resp: Clear to auscultation bilaterally GI: abdomen is soft, non-tender, non-distended, normal bowel sounds, no hepatosplenomegaly MSK: extremities are warm, no edema.  Skin: warm, no rash Neuro:  no  focal deficits Psych: appropriate affect   Diagnostic Studies 12/2014 Cath  HEMODYNAMICS:  Central Aorta: 170/59  Left Ventricle: 170/19  ANGIOGRAPHY:  Left main: Normal vessel which bifurcated into the LAD and left circumflex coronary artery  LAD: Moderate size proximal vessel that was totally occluded after the first septal and diagonal vessel.  Left circumflex: good size vessel that had 70% mid stenosis before a small marginal branch. The distal vessel was small caliber and there was retrograde filling of a previous sequential graft most likely between the small marginal  and distal circumflex.  Right coronary artery: Moderate size vessel with calcification and 80% proximal stenosis with a small area of probable thrombus in its midsegment prior to total mid occlusion.  LIMA to LAD: Widely patent and anastomosed into the mid LAD. There is retrograde filling to the proximal occlusion. The anastomosis was free of significant disease extended to the apex.  SVG sequential graft supplying a marginal and distal circumflex was totally occluded at its origin.  SVG E high marginal or ramus intermediate vessel was widely patent. There was mild 20% proximal smooth narrowing.   Left ventriculography revealed global LV dysfunction with an ejection fraction of 35 to less than 40%.   IMPRESSION:  Ischemic cardiomyopathy with an ejection fraction of 35 to less than 40% with diffuse global hypocontractility.  Significant native coronary obstructive disease with total occlusion of the proximal LAD after the septal diagonal vessel; 70% stenosis in the proximal AV groove circumflex with occlusion of the native OM branch and retrograde filling of the sequential limb distally to this mid branch and total occlusion of the mid right coronary artery with 80% calcified proximal stenosis and small area of thrombus in the mid RCA prior to the total occlusion.  Occluded vein graft which most likely  was the sequential graft supplying an OM and distal circumflex vessel the circumflex coronary artery.  Patent SVG which seems to supply a high marginal or ramus intermediate-like vessel this 20% proximal narrowing in the graft.  Patent SVG to the PDA branch of the RCA.  Patent LIMA graft to the mid LAD.  RECOMMENDATION:  The patient has an occluded graft which most likely sequentially supplied a small OM and distal circumflex vessel. 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 Platelets today are low at 81,000. He has not been on any significant antianginal regimen. Initial medical therapy will be recommended with close follow-up of his platelets. If recurrent symptomatology develops consider possible PCI to the native circumflex      Assessment and Plan   1. CAD - no  Symptoms. EKG in clinic shows NSR, chronic ST/T changes - we will continue current meds  2. HTN - above his goal of <130/80 given his history of DM2 - we will start norvasc 5mg  daily, he will submit bp log in 2 weeks.   3. Hyperlipidemia -did not tolerate high dose statin, he will continue simva 40mg  daily    F/u 6 months.      [name], M.D

## 2016-04-05 NOTE — Patient Instructions (Signed)
Your physician wants you to follow-up in: [place] [address] BRANCH You will receive a reminder letter in the mail two months in advance. If you don't receive a letter, please call our office to schedule the follow-up appointment.  Your physician has recommended you make the following change in your medication:   START AMLODIPINE 5 MG DAILY  Your physician has requested that you regularly monitor and record your blood pressure readings at home FOR 2 [place]. Please use the same machine at the same time of day to check your readings and record them to bring to your follow-up visit.  Thank you for choosing [facility]!!

## 2016-04-28 ENCOUNTER — Telehealth: Payer: Self-pay | Admitting: *Deleted

## 2016-04-28 NOTE — Telephone Encounter (Signed)
Pt aware.

## 2016-04-28 NOTE — Telephone Encounter (Signed)
-----   Message from [name], MD sent at 04/27/2016  3:37 PM EDT ----- BP log reviewed, looks good. No med changes. Please copy this note to chart   Zandra Abts MD

## 2016-05-02 ENCOUNTER — Other Ambulatory Visit: Payer: Self-pay | Admitting: Cardiology

## 2016-05-03 ENCOUNTER — Other Ambulatory Visit: Payer: Self-pay | Admitting: Cardiology

## 2016-05-04 IMAGING — MR MR ABDOMEN WO/W CM
9 of 18 series · 20 of 48 positions shown · IV contrast (multihance)
Comparison: CT the abdomen and pelvis 10/10/2014.

CLINICAL DATA: 34-year-old male with abdominal pain for the past 8
months. Abnormal renal lesion noted on recent CT examination.

EXAM:
MRI ABDOMEN WITHOUT AND WITH CONTRAST
TECHNIQUE: Multiplanar multisequence MR imaging of the abdomen was performed
both before and after the administration of intravenous contrast.
CONTRAST:  20mL MULTIHANCE GADOBENATE DIMEGLUMINE 529 MG/ML IV SOLN

[Series 3: DWI b500 · axial · 6.0mm · 1.48mm/px · z∈[-148,+133]mm · 2 of 74 slices shown]
[im 1/74]
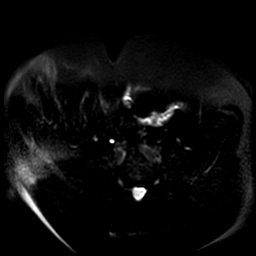
[im 74/74]
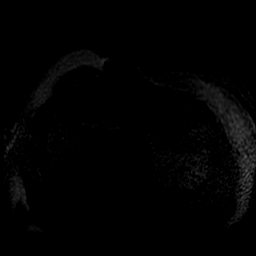

[Series 4: T2 fat-sat · axial · 5.0mm · 0.78mm/px · z∈[-142,+133]mm · 2 of 56 slices shown]
[im 1/56]
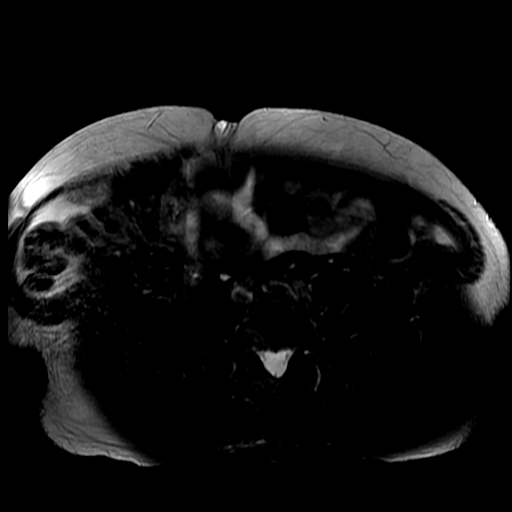
[im 56/56]
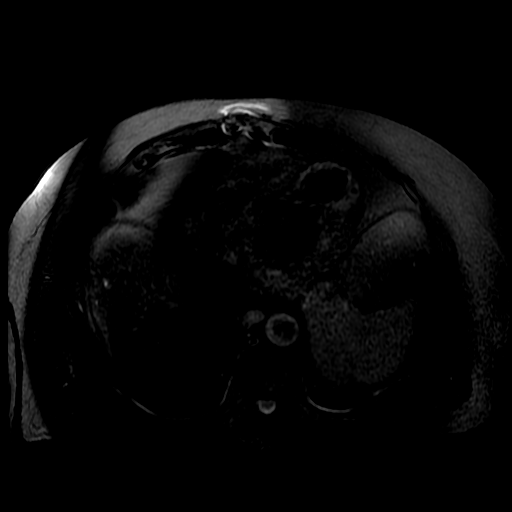

[Series 7: ax dualecho · axial · 5.0mm · 0.78mm/px · z∈[-135,+120]mm · 4 of 104 slices shown]
[im 1/104]
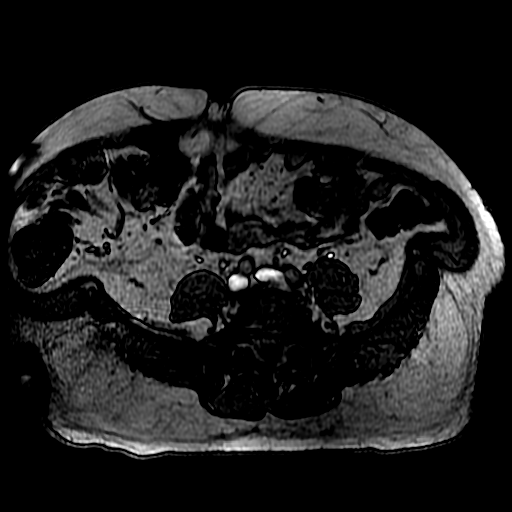
[im 35/104]
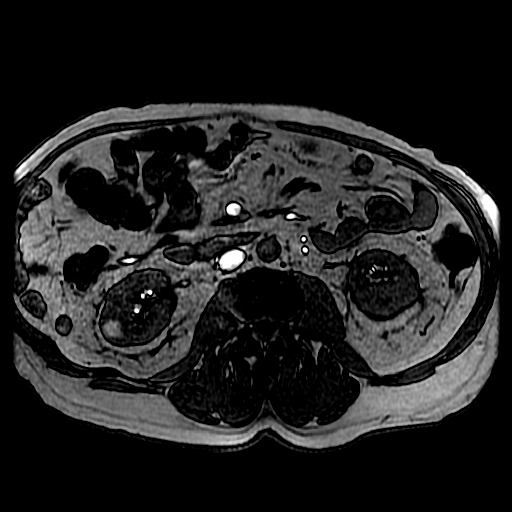
[im 69/104]
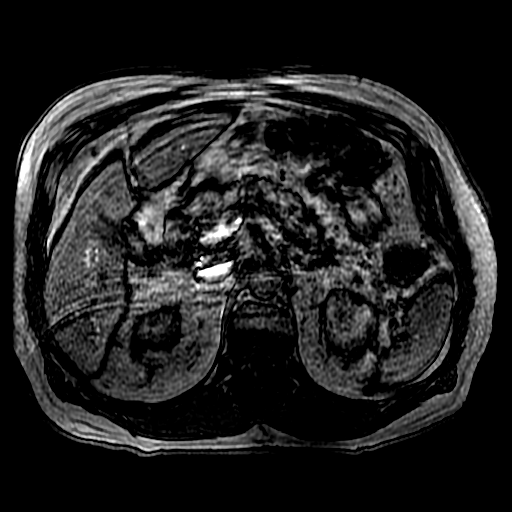
[im 104/104]
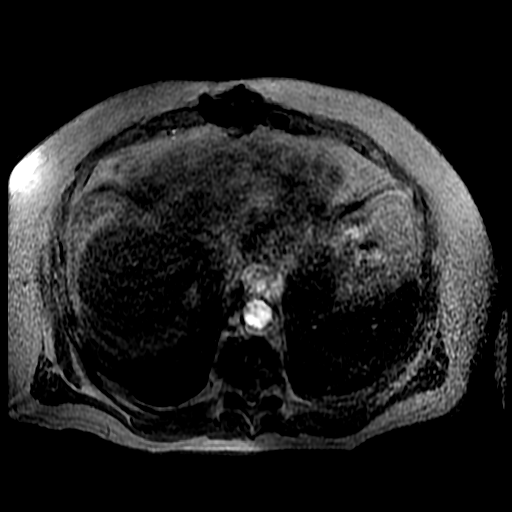

[Series 8: T2 · axial · 5.0mm · 0.78mm/px · z∈[-135,+120]mm · 2 of 52 slices shown (1 of 2)]
[im 1/52]
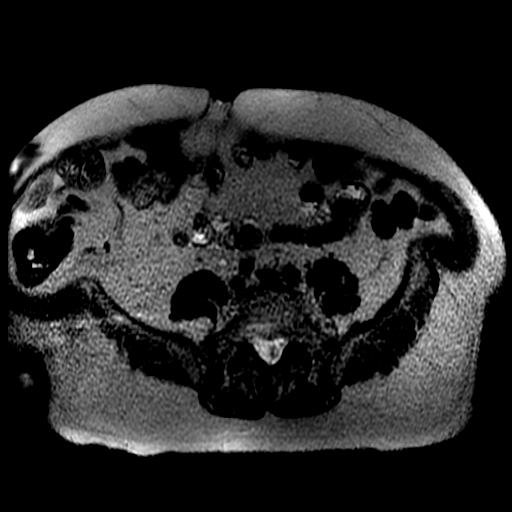
[im 52/52]
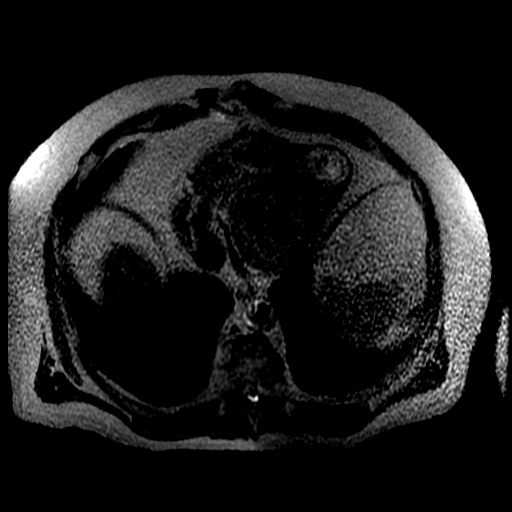

[Series 9: T2 · coronal · 5.0mm · 0.78mm/px · 2 of 47 slices shown (2 of 2)]
[im 1/47]
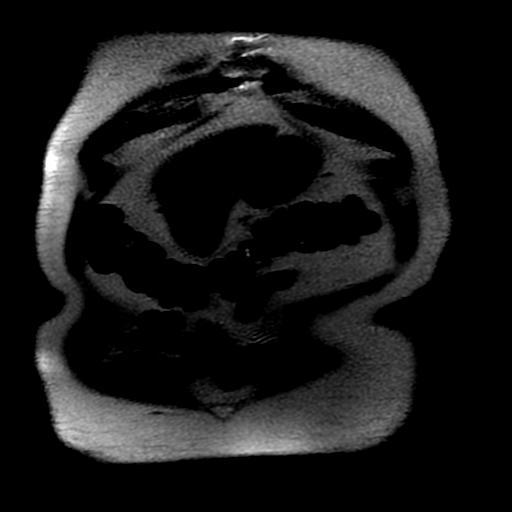
[im 47/47]
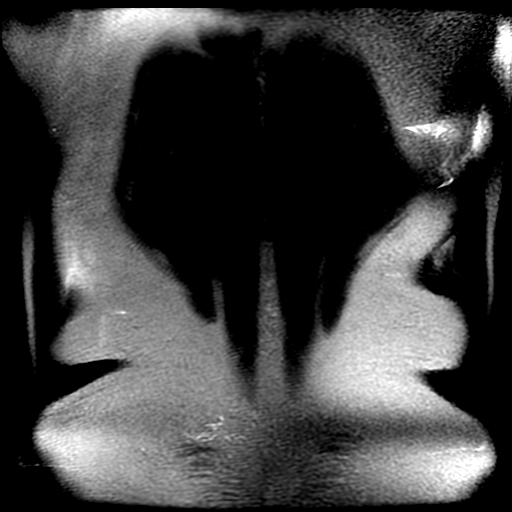

[Series 10: bSSFP · axial · 5.0mm · 0.78mm/px · z∈[-135,+120]mm · 2 of 52 slices shown]
[im 1/52]
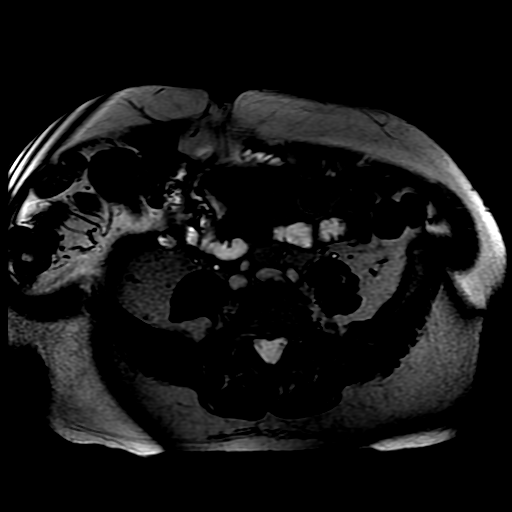
[im 52/52]
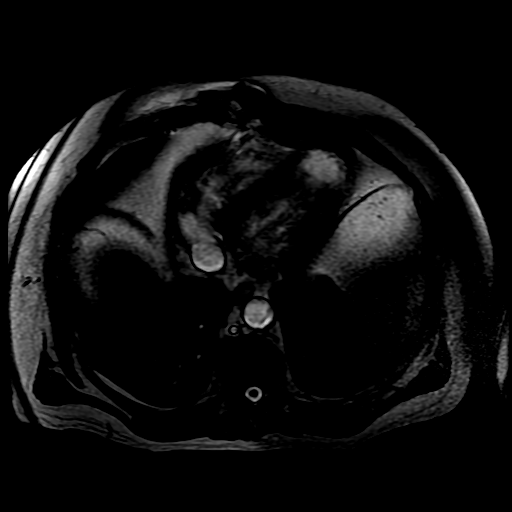

[Series 300: DWI · axial · 6.0mm · 1.48mm/px · 1 of 37 slices shown]
[im 1/37]
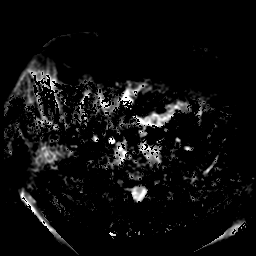

[Series 1100: T1 dynamic · axial · 6.0mm · 0.78mm/px · z∈[-153,+108]mm · 3 of 88 slices shown (1 of 2)]
[im 1/88]
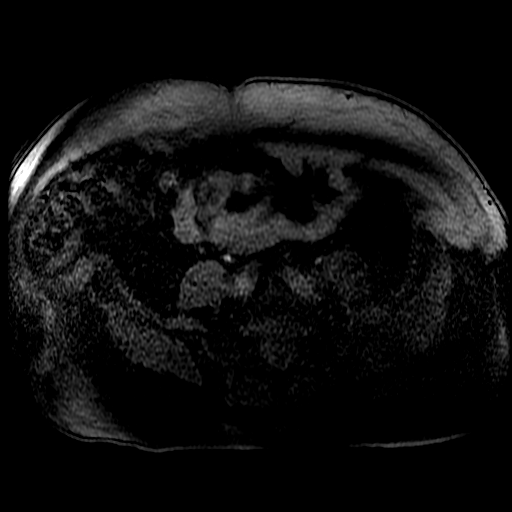
[im 44/88]
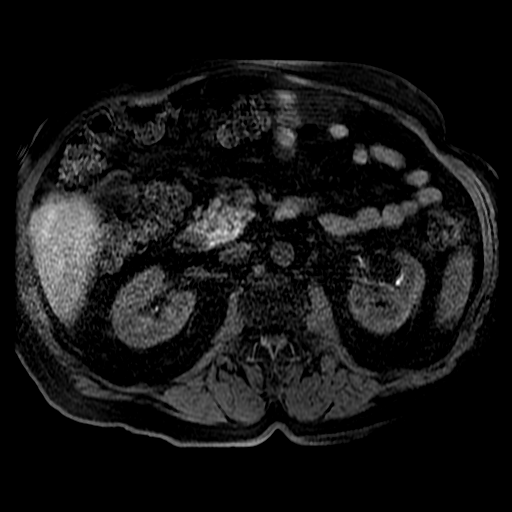
[im 88/88]
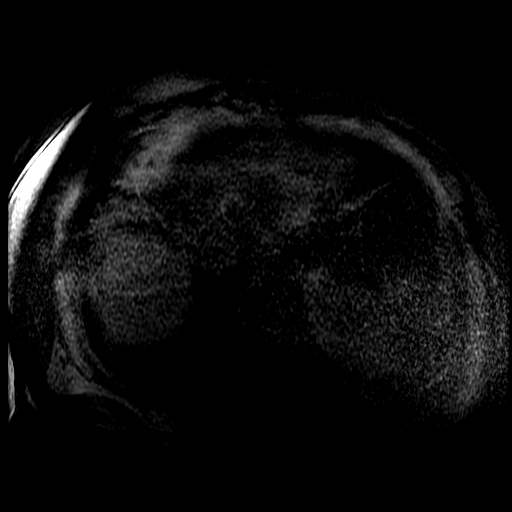

[Series 1101: T1 dynamic · axial · 6.0mm · 0.78mm/px · z∈[-153,-24]mm · 2 of 88 slices shown (2 of 2)]
[im 1/88]
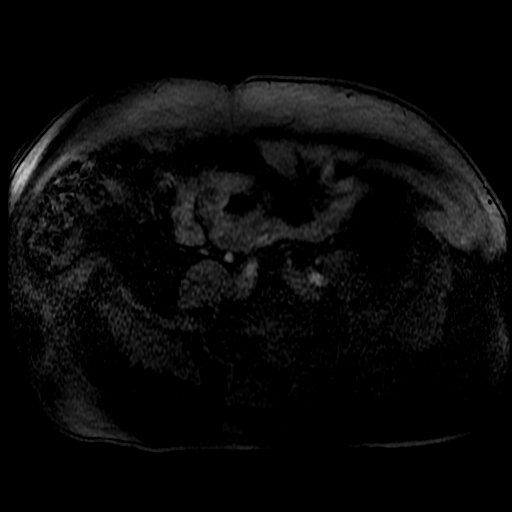
[im 44/88]
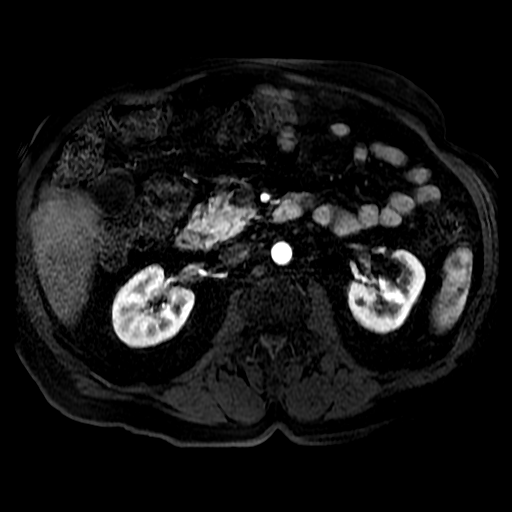

[20 of 48 positions shown; findings below may reference images not displayed]

FINDINGS: Lower chest:  Elevation of the left hemidiaphragm is unchanged.

Hepatobiliary: Mild diffuse loss of signal intensity throughout the
hepatic parenchyma on out of phase dual echo images, compatible with
hepatic steatosis. No discrete cystic or solid hepatic lesions. No
intra or extrahepatic biliary ductal dilatation. Small gallstones
lie dependently in the gallbladder. No current findings to suggest
acute cholecystitis at this time.

Pancreas: No pancreatic mass. No pancreatic ductal dilatation. No
pancreatic or peripancreatic fluid or inflammatory changes.

Spleen: Unremarkable.

Adrenals/Urinary Tract: The lesion of concern in the lower pole of
the right kidney is high T1 signal intensity, low T2 signal
intensity, does not restrict diffusion, and does not enhance,
compatible with a proteinaceous/ hemorrhagic cyst. There are several
T1 hypointense, T2 hyperintense, nonenhancing lesions in the kidneys
bilaterally, compatible with simple cysts, largest of which is in
the interpolar region of the left kidney measuring up to 2.6 cm. No
hydroureteronephrosis in the visualized abdomen.

Stomach/Bowel: Visualized portions are unremarkable.

Vascular/Lymphatic: No aneurysm identified in the abdominal
vasculature. No lymphadenopathy noted in the visualized abdomen.

Other: No significant volume of ascites in the visualized peritoneal
cavity.

Musculoskeletal: No aggressive osseous lesions noted in the
visualized portions of the skeleton.
IMPRESSION: 1. The lesion of concern in the lower pole of the right kidney has
imaging characteristics compatible with a proteinaceous/hemorrhagic
cyst.
2. Several other small simple cysts are also noted in the kidneys
bilaterally.
3. Cholelithiasis without evidence of acute cholecystitis at this
time.
4. Mild hepatic steatosis.

## 2016-06-07 ENCOUNTER — Other Ambulatory Visit: Payer: Self-pay | Admitting: *Deleted

## 2016-06-07 MED ORDER — ISOSORBIDE MONONITRATE ER 30 MG PO TB24: 30.0000 mg | ORAL_TABLET | Freq: Every day | ORAL | 3 refills | Status: DC

## 2016-10-06 ENCOUNTER — Ambulatory Visit: Payer: Medicare Other | Admitting: Cardiology

## 2016-10-07 ENCOUNTER — Encounter: Payer: Self-pay | Admitting: *Deleted

## 2016-10-08 ENCOUNTER — Encounter: Payer: Self-pay | Admitting: Cardiology

## 2016-10-08 ENCOUNTER — Ambulatory Visit (INDEPENDENT_AMBULATORY_CARE_PROVIDER_SITE_OTHER): Payer: Medicare Other | Admitting: Cardiology

## 2016-10-08 VITALS — BP 150/70 | HR 58 | Ht 69.0 in | Wt 220.0 lb

## 2016-10-08 DIAGNOSIS — I10 Essential (primary) hypertension: Secondary | ICD-10-CM | POA: Diagnosis not present

## 2016-10-08 DIAGNOSIS — E782 Mixed hyperlipidemia: Secondary | ICD-10-CM

## 2016-10-08 DIAGNOSIS — I2510 Atherosclerotic heart disease of native coronary artery without angina pectoris: Secondary | ICD-10-CM | POA: Diagnosis not present

## 2016-10-08 MED ORDER — VALSARTAN 160 MG PO TABS: 320.0000 mg | ORAL_TABLET | Freq: Every day | ORAL | 3 refills | Status: DC

## 2016-10-08 NOTE — Progress Notes (Signed)
Clinical Summary Troy [name] is a 76 y.o.male seen today for follow up of the following medical problems.   1. CAD - hx of CABG in 1996 - cath 12/2014 showed patent LM, LAD occluded, LCX 70% mid, RCA 80% prox. LIMA-LAD patent SVG-OM occluded, SVG-ramus patent. LVEF 35-40%. Managed medically, with recs if symptoms on medical therapy can consider LCX intervetntion - 12/2014 echo LVEF [id number], grade I diastolic dysfunction   - no chest pain, no SOB or DOE - compliatnt with meds  2. HTN - compliant with meds - home bp's 120s/60s  3. Hyperlipidemia - did not tolerate lipitor, has been on simvastatin.    Past Medical History:  Diagnosis Date  . CAD (coronary artery disease)    a. s/p CABG in 1996;  b.  Nuclear 2/11:  EF 34%,  large anterior and apical scar. There is mild peri-infarct ischemia;  c.  LHC 4/16:  LAD occld, LCx mid 70%, OM Troy [name] occl, RCA 80% prox, occl mid, L-LAD ok, S-OM/dCFX 100%, S-RI ok, EF 35-40% >> med Rx - PCI of CFX if refractory angina  . Diabetes mellitus   . GERD (gastroesophageal reflux disease)   . Hard of hearing    has hearing aids /BIL  . HLD (hyperlipidemia)   . Hypertension   . Ischemic cardiomyopathy    a. eval by EP in 2011 - no ICD needed at that time;  b.  Echo 4/16: inf and inf-lat HK, EF 50-55%, Gr 1 DD;  c. LHC 4/16: EF 35-40%  . Myocardial infarction 1996  . PAD (peripheral artery disease) (HCC)    Dr. Oneida Alar in the past  . Thrombocytopenia ([place])      No Known Allergies   Current Outpatient Prescriptions  Medication Sig Dispense Refill  . amLODipine (NORVASC) 5 MG tablet Take 1 tablet (5 mg total) by mouth daily. 90 tablet 3  . Ascorbic Acid (VITAMIN C) 1000 MG tablet Take 1,000 mg by mouth daily.      Troy [name] Kitchen aspirin 81 MG EC tablet Take 81 mg by mouth daily.      . Calcium Carbonate-Vitamin D (OYSTER SHELL CALCIUM 500 + D PO) Take 1 tablet by mouth daily.      . carvedilol (COREG) 6.25 MG tablet TAKE ONE TABLET BY MOUTH TWICE  DAILY WITH MEALS 180 tablet 3  . fish oil-omega-3 fatty acids 1000 MG capsule Take 2 g by mouth daily.      Troy [name] Kitchen glimepiride (AMARYL) 2 MG tablet Take 2 mg by mouth daily before breakfast.      . isosorbide mononitrate (IMDUR) 30 MG 24 hr tablet Take 1 tablet (30 mg total) by mouth daily. 90 tablet 3  . nitroGLYCERIN (NITROLINGUAL) 0.4 MG/SPRAY spray Place 1 spray under the tongue every 5 (five) minutes x 3 doses as needed for chest pain. 12 g 12  . Saxagliptin-Metformin (KOMBIGLYZE XR) 01-999 MG TB24 Take 1 tablet by mouth daily.     . simvastatin (ZOCOR) 40 MG tablet Take 1 tablet (40 mg total) by mouth daily. 90 tablet 3  . valsartan (DIOVAN) 160 MG tablet Take 2 tablets (320 mg total) by mouth daily. (Patient taking differently: Take 160 mg by mouth 2 (two) times daily. ) 180 tablet 3  . zolpidem (AMBIEN) 10 MG tablet Take 1 tablet by mouth at bedtime.      No current facility-administered medications for this visit.      Past Surgical History:  Procedure Laterality Date  . CATARACT  EXTRACTION W/PHACO Right 08/06/2013   Procedure: CATARACT EXTRACTION PHACO AND INTRAOCULAR LENS PLACEMENT RIGHT EYE;  Surgeon: Tonny Shannia Jacuinde, MD;  Location: [facility];  Service: Ophthalmology;  Laterality: Right;  CDE:  15.48  . COLONOSCOPY    . COLONOSCOPY WITH PROPOFOL N/A 10/22/2013   Procedure: COLONOSCOPY WITH PROPOFOL;  Surgeon: Irene Shipper, MD;  Location: [facility];  Service: Endoscopy;  Laterality: N/A;  . CORONARY ARTERY BYPASS GRAFT  1996   5 vessels  . HERNIA REPAIR     groin-25 yrs ago  . LEFT HEART CATHETERIZATION WITH CORONARY/GRAFT ANGIOGRAM N/A 01/06/2015   Procedure: LEFT HEART CATHETERIZATION WITH Beatrix Fetters;  Surgeon: Troy Sine, MD;  Location: [facility] CATH LAB;  Service: Cardiovascular;  Laterality: N/A;     No Known Allergies    Family History  Problem Relation Age of Onset  . Heart disease Mother   . Heart attack Mother   . Hypertension Mother   . Heart disease Father    . Heart attack Father   . Hypertension Father   . Cancer Brother   . Heart attack Sister   . Heart attack Brother   . Diabetes Brother   . Diabetes Sister   . Hypertension Sister   . Hypertension Brother   . Stroke Neg Hx      Social History Mr. Coulthard reports that he quit smoking about 26 years ago. His smoking use included Cigarettes. He started smoking about 59 years ago. He has a 30.00 pack-year smoking history. He has never used smokeless tobacco. Mr. Vlahos reports that he does not drink alcohol.   Review of Systems CONSTITUTIONAL: No weight loss, fever, chills, weakness or fatigue.  HEENT: Eyes: No visual loss, blurred vision, double vision or yellow sclerae.No hearing loss, sneezing, congestion, runny nose or sore throat.  SKIN: No rash or itching.  CARDIOVASCULAR: per hpi RESPIRATORY: No shortness of breath, cough or sputum.  GASTROINTESTINAL: No anorexia, nausea, vomiting or diarrhea. No abdominal pain or blood.  GENITOURINARY: No burning on urination, no polyuria NEUROLOGICAL: No headache, dizziness, syncope, paralysis, ataxia, numbness or tingling in the extremities. No change in bowel or bladder control.  MUSCULOSKELETAL: No muscle, back pain, joint pain or stiffness.  LYMPHATICS: No enlarged nodes. No history of splenectomy.  PSYCHIATRIC: No history of depression or anxiety.  ENDOCRINOLOGIC: No reports of sweating, cold or heat intolerance. No polyuria or polydipsia.  Troy [name] Kitchen   Physical Examination Vitals:   10/08/16 1052  BP: (!) 150/70  Pulse: (!) 58   Vitals:   10/08/16 1052  Weight: 220 lb (99.8 kg)  Height: 5\' 9"  (1.753 m)    Gen: resting comfortably, no acute distress HEENT: no scleral icterus, pupils equal round and reactive, no palptable cervical adenopathy,  CV: RRR, no m/r/g, no jvd Resp: Clear to auscultation bilaterally GI: abdomen is soft, non-tender, non-distended, normal bowel sounds, no hepatosplenomegaly MSK: extremities are warm, no  edema.  Skin: warm, no rash Neuro:  no focal deficits Psych: appropriate affect   Diagnostic Studies 12/2014 Cath  HEMODYNAMICS:  Central Aorta: 170/59  Left Ventricle: 170/19  ANGIOGRAPHY:  Left main: Normal vessel which bifurcated into the LAD and left circumflex coronary artery  LAD: Moderate size proximal vessel that was totally occluded after the first septal and diagonal vessel.  Left circumflex: good size vessel that had 70% mid stenosis before a small marginal Troy [name]. The distal vessel was small caliber and there was retrograde filling of a previous sequential graft most likely between the  small marginal and distal circumflex.  Right coronary artery: Moderate size vessel with calcification and 80% proximal stenosis with a small area of probable thrombus in its midsegment prior to total mid occlusion.  LIMA to LAD: Widely patent and anastomosed into the mid LAD. There is retrograde filling to the proximal occlusion. The anastomosis was free of significant disease extended to the apex.  SVG sequential graft supplying a marginal and distal circumflex was totally occluded at its origin.  SVG E high marginal or ramus intermediate vessel was widely patent. There was mild 20% proximal smooth narrowing.   Left ventriculography revealed global LV dysfunction with an ejection fraction of 35 to less than 40%.   IMPRESSION:  Ischemic cardiomyopathy with an ejection fraction of 35 to less than 40% with diffuse global hypocontractility.  Significant native coronary obstructive disease with total occlusion of the proximal LAD after the septal diagonal vessel; 70% stenosis in the proximal AV groove circumflex with occlusion of the native OM Tychelle Purkey and retrograde filling of the sequential limb distally to this mid Hamed Debella and total occlusion of the mid right coronary artery with 80% calcified proximal stenosis and small area of thrombus in the mid RCA prior to the  total occlusion.  Occluded vein graft which most likely was the sequential graft supplying an OM and distal circumflex vessel the circumflex coronary artery.  Patent SVG which seems to supply a high marginal or ramus intermediate-like vessel this 20% proximal narrowing in the graft.  Patent SVG to the PDA Kylo Gavin of the RCA.  Patent LIMA graft to the mid LAD.  POST CATH RECOMMENDATION:  The patient has an occluded graft which most likely sequentially supplied a small OM and distal circumflex vessel. 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 Platelets today are low at 81,000. He has not been on any significant antianginal regimen. Initial medical therapy will be recommended with close follow-up of his platelets. If recurrent symptomatology develops consider possible PCI to the native circumflex     Assessment and Plan   1. CAD - no recent symptoms - continue current meds  2. HTN -has some white coat HTN, home numbers are at goal -continue current meds  3. Hyperlipidemia -did not tolerate high dose statin, continue simva 40mg  daily - request labs from pcp    F/u 1 year      [name], M.D.

## 2016-10-08 NOTE — Patient Instructions (Addendum)
Your physician wants you to follow-up in: 1 YEAR WITH DR BRANCH You will receive a reminder letter in the mail two months in advance. If you don't receive a letter, please call our office to schedule the follow-up appointment.  Your physician recommends that you continue on your current medications as directed. Please refer to the Current Medication list given to you today.  Thank you for choosing [facility]!!    

## 2017-02-23 ENCOUNTER — Other Ambulatory Visit: Payer: Self-pay | Admitting: *Deleted

## 2017-02-23 MED ORDER — SIMVASTATIN 40 MG PO TABS: 40.0000 mg | ORAL_TABLET | Freq: Every day | ORAL | 1 refills | Status: DC

## 2017-03-31 ENCOUNTER — Other Ambulatory Visit: Payer: Self-pay | Admitting: Cardiology

## 2017-04-29 ENCOUNTER — Other Ambulatory Visit: Payer: Self-pay | Admitting: Cardiology

## 2017-05-17 ENCOUNTER — Telehealth: Payer: Self-pay

## 2017-05-17 MED ORDER — LOSARTAN POTASSIUM 100 MG PO TABS: 100.0000 mg | ORAL_TABLET | Freq: Every day | ORAL | 3 refills | Status: DC

## 2017-05-17 NOTE — Telephone Encounter (Signed)
Spoke with pt's wife, she voiced understanding. Discontinued valsartan, started losartan 100 mg daily.

## 2017-05-17 NOTE — Telephone Encounter (Signed)
-----   Message from [name], MD sent at 05/17/2017  8:53 AM EDT ----- Received notice from insurance patient is on valsartan that was recalled. Please stop his valsartan and start losartan 100mg  daily   Zandra Abts MD

## 2017-06-02 ENCOUNTER — Other Ambulatory Visit: Payer: Self-pay | Admitting: Cardiology

## 2017-08-24 ENCOUNTER — Other Ambulatory Visit: Payer: Self-pay | Admitting: Cardiology

## 2017-09-23 ENCOUNTER — Telehealth: Payer: Self-pay | Admitting: *Deleted

## 2017-09-23 NOTE — Telephone Encounter (Signed)
Increase norvasc to 10mg  daily, report home bp's in 2 weeks  Zandra Abts MD

## 2017-09-23 NOTE — Telephone Encounter (Signed)
Pt appt was RS to from Jan to February (provider out of office) pt says BP has been running higher than normal since switch to losartan 100 mg daily. Pt was going to wait and discuss of Jan office visit but since appt was RS pt was concerned about BP running 140-160s/70-80s consistently HR 60s. Pt denies any symptoms at this time.

## 2017-09-23 NOTE — Telephone Encounter (Signed)
Pt aware and voiced understanding. Will update [place] in 2 weeks on BP. Says he will double 5mg  amlodipine doesn't want a new rx sent at this time.

## 2017-10-07 ENCOUNTER — Telehealth: Payer: Self-pay | Admitting: Cardiology

## 2017-10-07 NOTE — Telephone Encounter (Signed)
Troy [name]  called wanting to give his BP readings.

## 2017-10-07 NOTE — Telephone Encounter (Signed)
Pt calling in BP readings per phone note - has been compliant with losartan 100 mg and amlodipine 10 mg - denies any symptoms has f/u appt 10/31/17 - routed to provider   12/29 - BP 125/64  12/30 -       129/71 12/31 -       138/67 1/1              140/74 1/2              144/75 1/3              137/69 1/4              153/73 1/5              116/61 1/6              130/65 1/7              121/72 1/8              121/68 1/9              136/69 1/10            134/67 1/11            134/58

## 2017-10-10 MED ORDER — LOSARTAN POTASSIUM 100 MG PO TABS: 100.0000 mg | ORAL_TABLET | Freq: Every day | ORAL | 1 refills | Status: DC

## 2017-10-10 NOTE — Telephone Encounter (Signed)
Bp's are a littlpe up and down and overall borderline controlled, please keep bp's every few days until our f/u in 10/2017 and we can decide where to go from there.

## 2017-10-10 NOTE — Telephone Encounter (Signed)
Pt aware and voiced understanding - requested refill on losartan - will bring BP readings in for Feb f/u appt.

## 2017-10-11 ENCOUNTER — Ambulatory Visit: Payer: Medicare Other | Admitting: Cardiology

## 2017-10-20 ENCOUNTER — Telehealth: Payer: Self-pay | Admitting: *Deleted

## 2017-10-20 MED ORDER — AMLODIPINE BESYLATE 10 MG PO TABS: 10.0000 mg | ORAL_TABLET | Freq: Every day | ORAL | 1 refills | Status: DC

## 2017-10-20 NOTE — Telephone Encounter (Signed)
Spoke with [facility] they did not have any recalled lots of Losartan. Pt made aware and will continue current meds

## 2017-10-20 NOTE — Telephone Encounter (Signed)
Pt wanted to know if he need to switch from losartan to another medication due to the additional recent recalls of losartan. Pt also requests Amlodipine 10 mg refills - routed to Dr Harl Bowie

## 2017-10-20 NOTE — Telephone Encounter (Signed)
Can we check with his pharmacy to see if he qualifites. Its a pretty specific maker of losartan that has been recalled, it is not a national recall. Typically pharmacies would send out notices, we have no way to know if he received that specific lot of losartan.    Zandra Abts MD

## 2017-10-30 ENCOUNTER — Encounter (HOSPITAL_COMMUNITY): Payer: Self-pay

## 2017-10-30 ENCOUNTER — Other Ambulatory Visit: Payer: Self-pay

## 2017-10-30 ENCOUNTER — Inpatient Hospital Stay (HOSPITAL_COMMUNITY)
Admission: EM | Admit: 2017-10-30 | Discharge: 2017-11-01 | DRG: 247 | Disposition: A | Discharge status: Home / Self Care | Payer: Medicare Other | Attending: Internal Medicine | Admitting: Internal Medicine

## 2017-10-30 ENCOUNTER — Emergency Department (HOSPITAL_COMMUNITY): Payer: Medicare Other

## 2017-10-30 DIAGNOSIS — K219 Gastro-esophageal reflux disease without esophagitis: Secondary | ICD-10-CM | POA: Diagnosis present

## 2017-10-30 DIAGNOSIS — I110 Hypertensive heart disease with heart failure: Secondary | ICD-10-CM | POA: Diagnosis present

## 2017-10-30 DIAGNOSIS — I10 Essential (primary) hypertension: Secondary | ICD-10-CM | POA: Diagnosis not present

## 2017-10-30 DIAGNOSIS — Z955 Presence of coronary angioplasty implant and graft: Secondary | ICD-10-CM

## 2017-10-30 DIAGNOSIS — I2510 Atherosclerotic heart disease of native coronary artery without angina pectoris: Secondary | ICD-10-CM | POA: Diagnosis present

## 2017-10-30 DIAGNOSIS — I5022 Chronic systolic (congestive) heart failure: Secondary | ICD-10-CM | POA: Diagnosis present

## 2017-10-30 DIAGNOSIS — Z87891 Personal history of nicotine dependence: Secondary | ICD-10-CM | POA: Diagnosis not present

## 2017-10-30 DIAGNOSIS — E785 Hyperlipidemia, unspecified: Secondary | ICD-10-CM | POA: Diagnosis present

## 2017-10-30 DIAGNOSIS — H9190 Unspecified hearing loss, unspecified ear: Secondary | ICD-10-CM | POA: Diagnosis present

## 2017-10-30 DIAGNOSIS — Z6831 Body mass index (BMI) 31.0-31.9, adult: Secondary | ICD-10-CM | POA: Diagnosis not present

## 2017-10-30 DIAGNOSIS — Z888 Allergy status to other drugs, medicaments and biological substances status: Secondary | ICD-10-CM | POA: Diagnosis not present

## 2017-10-30 DIAGNOSIS — D696 Thrombocytopenia, unspecified: Secondary | ICD-10-CM | POA: Diagnosis present

## 2017-10-30 DIAGNOSIS — I252 Old myocardial infarction: Secondary | ICD-10-CM

## 2017-10-30 DIAGNOSIS — Z7982 Long term (current) use of aspirin: Secondary | ICD-10-CM

## 2017-10-30 DIAGNOSIS — E119 Type 2 diabetes mellitus without complications: Secondary | ICD-10-CM | POA: Diagnosis present

## 2017-10-30 DIAGNOSIS — Z961 Presence of intraocular lens: Secondary | ICD-10-CM | POA: Diagnosis present

## 2017-10-30 DIAGNOSIS — I209 Angina pectoris, unspecified: Secondary | ICD-10-CM | POA: Diagnosis present

## 2017-10-30 DIAGNOSIS — Z8249 Family history of ischemic heart disease and other diseases of the circulatory system: Secondary | ICD-10-CM

## 2017-10-30 DIAGNOSIS — Z833 Family history of diabetes mellitus: Secondary | ICD-10-CM | POA: Diagnosis not present

## 2017-10-30 DIAGNOSIS — I255 Ischemic cardiomyopathy: Secondary | ICD-10-CM | POA: Diagnosis present

## 2017-10-30 DIAGNOSIS — I25810 Atherosclerosis of coronary artery bypass graft(s) without angina pectoris: Secondary | ICD-10-CM | POA: Diagnosis not present

## 2017-10-30 DIAGNOSIS — Z79899 Other long term (current) drug therapy: Secondary | ICD-10-CM

## 2017-10-30 DIAGNOSIS — Z951 Presence of aortocoronary bypass graft: Secondary | ICD-10-CM | POA: Diagnosis not present

## 2017-10-30 DIAGNOSIS — E782 Mixed hyperlipidemia: Secondary | ICD-10-CM | POA: Diagnosis not present

## 2017-10-30 DIAGNOSIS — Z7984 Long term (current) use of oral hypoglycemic drugs: Secondary | ICD-10-CM

## 2017-10-30 DIAGNOSIS — Z9841 Cataract extraction status, right eye: Secondary | ICD-10-CM

## 2017-10-30 DIAGNOSIS — E669 Obesity, unspecified: Secondary | ICD-10-CM | POA: Diagnosis present

## 2017-10-30 DIAGNOSIS — E1159 Type 2 diabetes mellitus with other circulatory complications: Secondary | ICD-10-CM | POA: Diagnosis not present

## 2017-10-30 DIAGNOSIS — I214 Non-ST elevation (NSTEMI) myocardial infarction: Secondary | ICD-10-CM | POA: Diagnosis present

## 2017-10-30 LAB — CBC
HCT: 39.5 % (ref 39.0–52.0)
Hemoglobin: 14 g/dL (ref 13.0–17.0)
MCH: 31 pg (ref 26.0–34.0)
MCHC: 35.4 g/dL (ref 30.0–36.0)
MCV: 87.6 fL (ref 78.0–100.0)
Platelets: 121 10*3/uL — ABNORMAL LOW (ref 150–400)
RBC: 4.51 MIL/uL (ref 4.22–5.81)
RDW: 13.6 % (ref 11.5–15.5)
WBC: 8.8 10*3/uL (ref 4.0–10.5)

## 2017-10-30 LAB — GLUCOSE, CAPILLARY: Glucose-Capillary: 266 mg/dL — ABNORMAL HIGH (ref 65–99)

## 2017-10-30 LAB — I-STAT TROPONIN, ED: Troponin i, poc: 0.26 ng/mL (ref 0.00–0.08)

## 2017-10-30 LAB — BASIC METABOLIC PANEL
Anion gap: 10 (ref 5–15)
BUN: 12 mg/dL (ref 6–20)
CO2: 22 mmol/L (ref 22–32)
Calcium: 9.4 mg/dL (ref 8.9–10.3)
Chloride: 103 mmol/L (ref 101–111)
Creatinine, Ser: 0.87 mg/dL (ref 0.61–1.24)
GFR calc Af Amer: >60 mL/min (ref >60)
GFR calc non Af Amer: >60 mL/min (ref >60)
Glucose, Bld: 352 mg/dL — ABNORMAL HIGH (ref 65–99)
Potassium: 4.2 mmol/L (ref 3.5–5.1)
Sodium: 135 mmol/L (ref 135–145)

## 2017-10-30 LAB — HEPARIN LEVEL (UNFRACTIONATED): Heparin Unfractionated: 0.18 IU/mL — ABNORMAL LOW (ref 0.30–0.70)

## 2017-10-30 MED ORDER — LOSARTAN POTASSIUM 50 MG PO TABS
100.0000 mg | ORAL_TABLET | Freq: Every day | ORAL | Status: DC
  Administered 2017-10-31 – 2017-11-01 (×2): 100 mg via ORAL
  Filled 2017-10-30 (×2): qty 2

## 2017-10-30 MED ORDER — SODIUM CHLORIDE 0.9% FLUSH: 3.0000 mL | INTRAVENOUS | Status: DC | PRN

## 2017-10-30 MED ORDER — HEPARIN (PORCINE) IN NACL 100-0.45 UNIT/ML-% IJ SOLN
1500.0000 [IU]/h | INTRAMUSCULAR | Status: DC
  Administered 2017-10-30: 1200 [IU]/h via INTRAVENOUS
  Administered 2017-10-31: 1400 [IU]/h via INTRAVENOUS
  Filled 2017-10-30: qty 250

## 2017-10-30 MED ORDER — SODIUM CHLORIDE 0.9 % IV SOLN
INTRAVENOUS | Status: DC
  Administered 2017-10-31: 50 mL/h via INTRAVENOUS

## 2017-10-30 MED ORDER — AMLODIPINE BESYLATE 10 MG PO TABS
10.0000 mg | ORAL_TABLET | Freq: Every day | ORAL | Status: DC
  Administered 2017-10-31 – 2017-11-01 (×2): 10 mg via ORAL
  Filled 2017-10-30 (×2): qty 1

## 2017-10-30 MED ORDER — ASPIRIN 81 MG PO CHEW
324.0000 mg | CHEWABLE_TABLET | Freq: Once | ORAL | Status: AC
  Administered 2017-10-30: 324 mg via ORAL
  Filled 2017-10-30: qty 4

## 2017-10-30 MED ORDER — HEPARIN BOLUS VIA INFUSION
4000.0000 [IU] | Freq: Once | INTRAVENOUS | Status: AC
  Administered 2017-10-30: 4000 [IU] via INTRAVENOUS
  Filled 2017-10-30: qty 4000

## 2017-10-30 MED ORDER — SODIUM CHLORIDE 0.9% FLUSH
3.0000 mL | Freq: Two times a day (BID) | INTRAVENOUS | Status: DC
  Administered 2017-10-31 – 2017-11-01 (×2): 3 mL via INTRAVENOUS

## 2017-10-30 MED ORDER — ONDANSETRON HCL 4 MG/2ML IJ SOLN
4.0000 mg | Freq: Four times a day (QID) | INTRAMUSCULAR | Status: DC | PRN
  Administered 2017-10-31: 4 mg via INTRAVENOUS
  Filled 2017-10-30: qty 2

## 2017-10-30 MED ORDER — SODIUM CHLORIDE 0.9 % IV SOLN: 250.0000 mL | INTRAVENOUS | Status: DC | PRN

## 2017-10-30 MED ORDER — ASPIRIN 81 MG PO CHEW
81.0000 mg | CHEWABLE_TABLET | ORAL | Status: AC
  Administered 2017-10-31: 81 mg via ORAL
  Filled 2017-10-30: qty 1

## 2017-10-30 MED ORDER — SODIUM CHLORIDE 0.9% FLUSH: 3.0000 mL | Freq: Two times a day (BID) | INTRAVENOUS | Status: DC

## 2017-10-30 MED ORDER — NITROGLYCERIN IN D5W 200-5 MCG/ML-% IV SOLN
0.0000 ug/min | INTRAVENOUS | Status: DC
  Administered 2017-10-30: 5 ug/min via INTRAVENOUS
  Filled 2017-10-30: qty 250

## 2017-10-30 MED ORDER — ASPIRIN EC 81 MG PO TBEC: 81.0000 mg | DELAYED_RELEASE_TABLET | Freq: Every day | ORAL | Status: DC

## 2017-10-30 MED ORDER — NITROGLYCERIN 0.4 MG SL SUBL
0.4000 mg | SUBLINGUAL_TABLET | SUBLINGUAL | Status: DC | PRN
Start: 2017-10-30 — End: 2017-11-01

## 2017-10-30 MED ORDER — SIMVASTATIN 40 MG PO TABS
40.0000 mg | ORAL_TABLET | Freq: Every day | ORAL | Status: DC
  Administered 2017-10-31: 40 mg via ORAL
  Filled 2017-10-30: qty 1

## 2017-10-30 MED ORDER — CARVEDILOL 6.25 MG PO TABS
6.2500 mg | ORAL_TABLET | Freq: Two times a day (BID) | ORAL | Status: DC
  Administered 2017-10-30 – 2017-11-01 (×4): 6.25 mg via ORAL
  Filled 2017-10-30 (×4): qty 1

## 2017-10-30 MED ORDER — GLIMEPIRIDE 4 MG PO TABS
4.0000 mg | ORAL_TABLET | Freq: Every day | ORAL | Status: DC
  Administered 2017-11-01: 4 mg via ORAL
  Filled 2017-10-30: qty 1

## 2017-10-30 MED ORDER — ACETAMINOPHEN 325 MG PO TABS: 650.0000 mg | ORAL_TABLET | ORAL | Status: DC | PRN

## 2017-10-30 MED ORDER — INSULIN ASPART 100 UNIT/ML ~~LOC~~ SOLN
0.0000 [IU] | Freq: Three times a day (TID) | SUBCUTANEOUS | Status: DC
  Administered 2017-10-31: 8 [IU] via SUBCUTANEOUS
  Administered 2017-10-31 – 2017-11-01 (×3): 5 [IU] via SUBCUTANEOUS

## 2017-10-30 NOTE — H&P (Signed)
History & Physical    Patient ID: Troy [name] MRN: [id number], DOB/AGE: 76/24/1942   Admit date: 10/30/2017   Primary Physician: Reynold Bowen, MD Primary Cardiologist: Carlyle Dolly, MD  Patient Profile    77 year old male with prior history of CAD status post CABG x 5 in 1996, ischemic cardiomyopathy with an EF of 42%, hypertension, hyperlipidemia, GERD, diabetes, peripheral arterial disease, and thrombocytopenia, who presents to the ED on February 2/3 with chest pain and non-STEMI.  Past Medical History    Past Medical History:  Diagnosis Date  . CAD (coronary artery disease)    a. 1996 s/p CABG x 5 (LIMA->LAD, VG->OM->dLCX, VG->RI, VG->RPDA);  b.  Nuclear 2/11:  EF 34%,  large anterior and apical scar. There is mild peri-infarct ischemia;  c.  LHC 4/16:  LAD occld, LCx mid 70%, OM branch occl, RCA 80% prox, occl mid, L-LAD ok, S-OM/dCFX 100%, S-RI ok, S-RPDA ok, EF 35-40% >> med Rx - PCI of CFX if refractory angina  . Diabetes mellitus   . GERD (gastroesophageal reflux disease)   . Hard of hearing    has hearing aids /BIL  . HLD (hyperlipidemia)   . Hypertension   . Ischemic cardiomyopathy    a. eval by EP in 2011 - no ICD needed at that time;  b.  Echo 4/16: inf and inf-lat HK, EF 50-55%, Gr 1 DD;  c. LHC 4/16: EF 35-40%; d. 01/2014 Cardiac MRI: EF 42%, 2/3rd thickness mid and distal ant/septal/apical scar.  . Myocardial infarction (Icard) 1996  . PAD (peripheral artery disease) (HCC)    Dr. Oneida Alar in the past  . Thrombocytopenia [facility])     Past Surgical History:  Procedure Laterality Date  . CATARACT EXTRACTION W/PHACO Right 08/06/2013   Procedure: CATARACT EXTRACTION PHACO AND INTRAOCULAR LENS PLACEMENT RIGHT EYE;  Surgeon: Tonny Branch, MD;  Location: [facility];  Service: Ophthalmology;  Laterality: Right;  CDE:  15.48  . COLONOSCOPY    . COLONOSCOPY WITH PROPOFOL N/A 10/22/2013   Procedure: COLONOSCOPY WITH PROPOFOL;  Surgeon: Irene Shipper, MD;  Location: [facility] ENDOS[facility];   Service: Endoscopy;  Laterality: N/A;  . CORONARY ARTERY BYPASS GRAFT  1996   5 vessels  . HERNIA REPAIR     groin-25 yrs ago  . LEFT HEART CATHETERIZATION WITH CORONARY/GRAFT ANGIOGRAM N/A 01/06/2015   Procedure: LEFT HEART CATHETERIZATION WITH Beatrix Fetters;  Surgeon: Troy Sine, MD;  Location: [facility] CATH LAB;  Service: Cardiovascular;  Laterality: N/A;     Allergies  Allergies  Allergen Reactions  . Lipitor [Atorvastatin Calcium]     Myalgias     History of Present Illness    77 year old male with the above complex past medical history including coronary artery disease status post prior CABG x 5 in 1996, hypertension, hyperlipidemia, diabetes, obesity, peripheral arterial disease, thrombocytopenia, GERD, and ischemic cardiomyopathy.  His last diagnostic catheterization was in April 2016, at which time his LIMA to the LAD, VG  RPDA, and VG  RI were patent, while the sequential vein graft to the obtuse marginal and distal circumflex was occluded.  He did have 70% stenosis in the native LCX but in the absence of active angina and with h/o thrombocytopenia, medical rx was recommended.  Of note, EF on echo prior to cath was read as 50-55% but was 45-40% by left ventriculography.  Cardiac MRI in 01/2015 showed an EF of 42%.  Pt has since been followed by Dr. Harl Bowie in our [place] office.  He was  last seen in 09/2016, at which time he was doing well.  He participates in water aerobics most days of the week and has been tolerating that well without any significant symptoms or limitations.  He notes that occasionally, he would have a brief or mild episode of chest discomfort, which he did not give very much thought to.  He was in his usual state of health until the morning of February 1, when he was at water aerobics and had sudden onset of substernal chest tightness associated with dyspnea, wheezing, and dizziness.  He said he felt drunk and like the room was spinning.  He left the aerobics  class, showered, and changed and then went and sat outside.  Within about 30 minutes, he was feeling better and he and his wife went home.  Though dyspnea and dizziness resolved, but he continued to have moderate left-sided chest pain.  This persisted throughout the day on Friday.  When he woke up Saturday morning, he initially felt well but shortly thereafter, again noted left-sided chest discomfort without associated symptoms while at rest.  With any amount of activity, chest pain worsened and became associated with dyspnea.  He also noted marked fatigue with any activity.  Symptoms persisted throughout the day on February 2 and he again noted symptoms this morning.  This prompted him to present to the emergency department.  Here, ECG was nonacute however, his initial troponin was mildly elevated at 0.26.  He has been treated with IV nitroglycerin and chest pain is currently a 3-4/10.  He is hemodynamically stable.  Heparin infusion has been ordered.  Home Medications    Prior to Admission medications   Medication Sig Start Date End Date Taking? Authorizing Provider  amLODipine (NORVASC) 10 MG tablet Take 1 tablet (10 mg total) by mouth daily. 10/20/17  Yes BranchAlphonse Guild, MD  Ascorbic Acid (VITAMIN C) 1000 MG tablet Take 1,000 mg by mouth daily.     Yes [provider]  aspirin 81 MG EC tablet Take 81 mg by mouth daily.     Yes [provider]  Calcium Carbonate-Vitamin D (OYSTER SHELL CALCIUM 500 + D PO) Take 1 tablet by mouth daily.     Yes [provider]  carvedilol (COREG) 6.25 MG tablet TAKE ONE TABLET BY MOUTH TWICE DAILY WITH MEALS Patient taking differently: 6.25mg  by mouth twice daily 04/29/17  Yes Branch, Alphonse Guild, MD  fish oil-omega-3 fatty acids 1000 MG capsule Take 2 g by mouth daily.     Yes [provider]  glimepiride (AMARYL) 2 MG tablet Take 4 mg by mouth daily before breakfast.    Yes [provider]  isosorbide mononitrate (IMDUR)  30 MG 24 hr tablet TAKE ONE TABLET BY MOUTH ONCE DAILY Patient taking differently: 30mg  by mouth once daily 06/02/17  Yes Branch, Alphonse Guild, MD  losartan (COZAAR) 100 MG tablet Take 1 tablet (100 mg total) by mouth daily. 10/10/17 01/08/18 Yes Branch, Alphonse Guild, MD  nitroGLYCERIN (NITROLINGUAL) 0.4 MG/SPRAY spray Place 1 spray under the tongue every 5 (five) minutes x 3 doses as needed for chest pain. 01/07/15  Yes Barrett, Evelene Croon, PA-C  Saxagliptin-Metformin (KOMBIGLYZE XR) 01-999 MG TB24 Take 1 tablet by mouth daily.    Yes [provider]  simvastatin (ZOCOR) 40 MG tablet TAKE 1 TABLET BY MOUTH ONCE DAILY 08/25/17  Yes Branch, Alphonse Guild, MD  zolpidem (AMBIEN) 10 MG tablet Take 1 tablet by mouth at bedtime.  06/01/11  Yes  [provider]    Family History    Family History  Problem Relation Age of Onset  . Heart disease Mother   . Heart attack Mother   . Hypertension Mother   . Heart disease Father   . Heart attack Father   . Hypertension Father   . Cancer Brother   . Heart attack Sister   . Heart attack Brother   . Diabetes Brother   . Diabetes Sister   . Hypertension Sister   . Hypertension Brother   . Stroke Neg Hx    indicated that his mother is deceased. He indicated that his father is deceased. He indicated that three of his seven sisters are alive. He indicated that two of his seven brothers are alive. He indicated that the status of his neg hx is unknown.   Social History    Social History   Socioeconomic History  . Marital status: Married    Spouse name: Not on file  . Number of children: 4  . Years of education: Not on file  . Highest education level: Not on file  Social Needs  . Financial resource strain: Not on file  . Food insecurity - worry: Not on file  . Food insecurity - inability: Not on file  . Transportation needs - medical: Not on file  . Transportation needs - non-medical: Not on file  Occupational History  . Occupation: retired  Magazine features editor: RETIRED  Tobacco Use  . Smoking status: Former Smoker    Packs/day: 1.00    Years: 30.00    Pack years: 30.00    Types: Cigarettes    Start date: 10/03/1957    Last attempt to quit: 09/27/1990    Years since quitting: 27.1  . Smokeless tobacco: Never Used  Substance and Sexual Activity  . Alcohol use: No    Alcohol/week: 0.0 oz  . Drug use: No  . Sexual activity: Not Currently  Other Topics Concern  . Not on file  Social History Narrative   Lives in [place] with wife.  Does water aerobics almost daily.     Review of Systems    General:  +++ fatigue and reduced ex tolerance over past 3 day.  No chills, fever, night sweats or weight changes.  Cardiovascular:  +++ chest pain, +++ dyspnea on exertion, no edema, orthopnea, palpitations, paroxysmal nocturnal dyspnea. Dermatological: No rash, lesions/masses Respiratory: No cough, +++ dyspnea Urologic: No hematuria, dysuria Abdominal:   No nausea, vomiting, diarrhea, bright red blood per rectum, melena, or hematemesis Neurologic:  +++ dizziness/room spinning.  No changes in mental status. All other systems reviewed and are otherwise negative except as noted above.  Physical Exam    Blood pressure 133/69, pulse 69, temperature 98.1 F (36.7 C), temperature source Oral, resp. rate 17, SpO2 96 %.  General: Pleasant, NAD Psych: Normal affect. Neuro: Alert and oriented X 3. Moves all extremities spontaneously. HEENT: Normal  Neck: Supple without bruits or JVD. Lungs:  Resp regular and unlabored, CTA. Heart: RRR no s3, s4, or murmurs. Abdomen: Soft, non-tender, non-distended, BS + x 4.  Extremities: No clubbing, cyanosis or edema. DP/PT/Radials 2+ and equal bilaterally.  Labs    Troponin [facility] Test) Recent Labs    10/30/17 1117  TROPIPOC 0.26*   Lab Results  Component Value Date   WBC 8.8 10/30/2017   HGB 14.0 10/30/2017   HCT 39.5 10/30/2017   MCV 87.6 10/30/2017   PLT 121 (L)  10/30/2017    Recent Labs  Lab 10/30/17 1102  NA 135  K 4.2  CL 103  CO2 22  BUN 12  CREATININE 0.87  CALCIUM 9.4  GLUCOSE 352*   Lab Results  Component Value Date   CHOL 111 01/04/2015   HDL 28 (L) 01/04/2015   LDLCALC 65 01/04/2015   TRIG 91 01/04/2015     Radiology Studies    Dg Chest 2 View  Result Date: 10/30/2017 CLINICAL DATA:  Left chest pain, shortness of breath, fatigue, cough EXAM: CHEST  2 VIEW COMPARISON:  01/03/2015 FINDINGS: Mild bibasilar opacities, possibly atelectasis, although mild subpleural reticulation/fibrosis is favored. No focal consolidation. No pleural effusion or pneumothorax. The heart is top-normal in size. Postsurgical changes related to prior CABG. Degenerative changes of the visualized thoracolumbar spine. Median sternotomy. IMPRESSION: No evidence of acute cardiopulmonary disease. Electronically Signed   By: Julian Hy M.D.   On: 10/30/2017 11:28    ECG & Cardiac Imaging    Rsr, 74, 1st deg avb, prior antsept infarct w/ lateral twi.  No acute changes.  Assessment & Plan    1. NSTEMI/CAD:  S/p 5 vessel CABG in 1996 with subsequent finding of occluded seq VG  OM  dLCX in 2016.  Known 70% LCX dzs @ that time with rec for med rx in the setting of thrombocytopenia and limited Ss.  Otw, LIMA  LAD, VG  RI, and VG  RPDA patent at that time.  He had been doing well and exercising regularly but developed chest pain and dyspnea while performing water aerobics on Friday, February 1.  Chest discomfort has persisted since Friday and has been associated with some left arm discomfort as well.  Due to persistent symptoms, he presented to the emergency department today where troponin was found to be elevated at 0.26.  ECG nonacute.  He has been placed on IV nitroglycerin and chest pain is currently 4/10.  He is hemodynamically stable.  Plan to admit and continue to cycle cardiac enzymes.  Titrate nitroglycerin for effect.  Heparin ordered.  We will have to  watch platelets closely on heparin.  Continue aspirin, statin, beta-blocker, and ARB.  Plan on diagnostic catheterization in the a.m.  The patient understands that risks include but are not limited to stroke (1 in 1000), death (1 in 71), kidney failure [usually temporary] (1 in 500), bleeding (1 in 200), allergic reaction [possibly serious] (1 in 200), and agrees to proceed.  Hold metformin.  2.  ICM/HFrEF:  EF 42% by cardiac MRI in 01/2015.  Euvolemic on exam.  Continue beta-blocker and ARB.  3.  Hypertensive Heart Disease: Blood pressure currently stable on IV nitroglycerin.  Follow.  4.  HL:  LDL 65 in 12/2014 in our system.  We will follow-up in a.m.  Does not tolerate high dose lipitor.  Has been ok w/ simva.  5.  DM II: Hold metformin.  Add sliding scale insulin.  Consider Jardiance in setting of history of LV dysfunction.  6.  Thrombocytopenia: Currently stable at 121.  Follow-up with heparinization.  Signed, Murray Hodgkins, NP 10/30/2017, 2:40 PM   Patient seen and examined with the above-signed Advanced Practice Provider and/or Housestaff. I personally reviewed laboratory data, imaging studies and relevant notes. I independently examined the patient and formulated the important aspects of the plan. I have edited the note to reflect any of my changes or salient points. I have personally discussed the plan with the patient and/or family.  77 y/o with  CAD and ischemic CM. Presents with several day h/o intermittent angina. Currently CP free but troponin elevated c/w with NSTEMI. Denies HF symptoms. ECG non-acute  On exam  JVP 6-7 Cor RRR  Lungs clear Ab obese NT/ND Ext warm no edema  Start heparin. Continue ASA, b-blocker, statin. Cath in am. Consider Jardiance at d/c.   Glori Bickers, MD  3:05 PM

## 2017-10-30 NOTE — ED Notes (Signed)
Pt has mealtray at bedside  

## 2017-10-30 NOTE — ED Notes (Signed)
Attempted report x1. 

## 2017-10-30 NOTE — ED Provider Notes (Addendum)
[facility] EMERGENCY DEPARTMENT Provider Note   CSN: [id number] Arrival date & time: 10/30/17  1043     History   Chief Complaint Chief Complaint  Patient presents with  . Chest Pain  . Shortness of Breath    HPI MANINDER DEBOER is a 77 y.o. male with a history of CAD (followed by [facility]), ischemic cardiomyopathy, diabetes, GERD, hyperlipidemia, hypertension who presents the emergency department today for chest pain or shortness of breath.  Patient presents with wife who helps provide history. Patient states that on Friday he was in an water aerobics class when he started developing left-sided chest pain with radiation into the left arm described as a "horse sitting on my chest" with associated shortness of breath, nausea, diaphoresis.  The patient got out of the pool and sat in the lobby with some relief of his pain.  He notes that each time that he ambulated his pain worsened.  The pain lasted for approximately 30-45 minutes before dissipating.  Patient notes that since that time he has had similar chest pain anytime he ambulates more than 10 feet.  This is new.  Patient did not take any medications or nitro to try to relieve his pain.  He takes 81 mg of aspirin daily.  No Plavix use.  He states this feels similar to his prior heart attack.  Patient is [facility]  Dr. Harl Bowie note from last year shows patient does have a significant disease of circumflex with 70% occlusion.  Plan to manage medically and if recurrent symptomatology develops could consider PCI. Patient rates current chest pain as 5/10.   HPI  Past Medical History:  Diagnosis Date  . CAD (coronary artery disease)    a. s/p CABG in 1996;  b.  Nuclear 2/11:  EF 34%,  large anterior and apical scar. There is mild peri-infarct ischemia;  c.  LHC 4/16:  LAD occld, LCx mid 70%, OM branch occl, RCA 80% prox, occl mid, L-LAD ok, S-OM/dCFX 100%, S-RI ok, EF 35-40% >> med Rx  - PCI of CFX if refractory angina  . Diabetes mellitus   . GERD (gastroesophageal reflux disease)   . Hard of hearing    has hearing aids /BIL  . HLD (hyperlipidemia)   . Hypertension   . Ischemic cardiomyopathy    a. eval by EP in 2011 - no ICD needed at that time;  b.  Echo 4/16: inf and inf-lat HK, EF 50-55%, Gr 1 DD;  c. LHC 4/16: EF 35-40%  . Myocardial infarction ([place]) 1996  . PAD (peripheral artery disease) ([place])    Dr. Oneida Alar in the past  . Thrombocytopenia [facility])     Patient Active Problem List   Diagnosis Date Noted  . Thrombocytopenia ([place]) 01/07/2015  . NSVT (nonsustained ventricular tachycardia) ([place]) 01/03/2015  . Unstable angina ([place]) 01/03/2015  . Ischemic cardiomyopathy, EF 35% 05/2011 01/03/2015  . Acute anterior wall MI ([place]) 06/08/2011  . PVD 10/17/2009  . MURMUR 10/17/2009  . DYSPNEA 10/17/2009  . DM type 2 (diabetes mellitus, type 2) ([place]) 10/16/2009  . Essential hypertension 10/16/2009  . Coronary atherosclerosis 10/16/2009  . ARTERIAL OCCLUSIVE DISEASE 10/16/2009    Past Surgical History:  Procedure Laterality Date  . CATARACT EXTRACTION W/PHACO Right 08/06/2013   Procedure: CATARACT EXTRACTION PHACO AND INTRAOCULAR LENS PLACEMENT RIGHT EYE;  Surgeon: Tonny Branch, MD;  Location: [facility];  Service: Ophthalmology;  Laterality: Right;  CDE:  15.48  .  COLONOSCOPY    . COLONOSCOPY WITH PROPOFOL N/A 10/22/2013   Procedure: COLONOSCOPY WITH PROPOFOL;  Surgeon: Irene Shipper, MD;  Location: [facility] ENDOSCOPY;  Service: Endoscopy;  Laterality: N/A;  . CORONARY ARTERY BYPASS GRAFT  1996   5 vessels  . HERNIA REPAIR     groin-25 yrs ago  . LEFT HEART CATHETERIZATION WITH CORONARY/GRAFT ANGIOGRAM N/A 01/06/2015   Procedure: LEFT HEART CATHETERIZATION WITH Beatrix Fetters;  Surgeon: Troy Sine, MD;  Location: [facility] CATH LAB;  Service: Cardiovascular;  Laterality: N/A;       Home Medications    Prior to Admission medications   Medication Sig Start Date End  Date Taking? Authorizing Provider  amLODipine (NORVASC) 10 MG tablet Take 1 tablet (10 mg total) by mouth daily. 10/20/17  Yes BranchAlphonse Guild, MD  Ascorbic Acid (VITAMIN C) 1000 MG tablet Take 1,000 mg by mouth daily.     Yes [provider]  aspirin 81 MG EC tablet Take 81 mg by mouth daily.     Yes [provider]  Calcium Carbonate-Vitamin D (OYSTER SHELL CALCIUM 500 + D PO) Take 1 tablet by mouth daily.     Yes [provider]  carvedilol (COREG) 6.25 MG tablet TAKE ONE TABLET BY MOUTH TWICE DAILY WITH MEALS Patient taking differently: 6.25mg  by mouth twice daily 04/29/17  Yes Branch, Alphonse Guild, MD  fish oil-omega-3 fatty acids 1000 MG capsule Take 2 g by mouth daily.     Yes [provider]  glimepiride (AMARYL) 2 MG tablet Take 4 mg by mouth daily before breakfast.    Yes [provider]  isosorbide mononitrate (IMDUR) 30 MG 24 hr tablet TAKE ONE TABLET BY MOUTH ONCE DAILY Patient taking differently: 30mg  by mouth once daily 06/02/17  Yes Branch, Alphonse Guild, MD  losartan (COZAAR) 100 MG tablet Take 1 tablet (100 mg total) by mouth daily. 10/10/17 01/08/18 Yes Branch, Alphonse Guild, MD  nitroGLYCERIN (NITROLINGUAL) 0.4 MG/SPRAY spray Place 1 spray under the tongue every 5 (five) minutes x 3 doses as needed for chest pain. 01/07/15  Yes Barrett, Evelene Croon, PA-C  Saxagliptin-Metformin (KOMBIGLYZE XR) 01-999 MG TB24 Take 1 tablet by mouth daily.    Yes [provider]  simvastatin (ZOCOR) 40 MG tablet TAKE 1 TABLET BY MOUTH ONCE DAILY 08/25/17  Yes Branch, Alphonse Guild, MD  zolpidem (AMBIEN) 10 MG tablet Take 1 tablet by mouth at bedtime.  06/01/11  Yes [provider]    Family History Family History  Problem Relation Age of Onset  . Heart disease Mother   . Heart attack Mother   . Hypertension Mother   . Heart disease Father   . Heart attack Father   . Hypertension Father   . Cancer Brother   . Heart attack Sister   . Heart attack  Brother   . Diabetes Brother   . Diabetes Sister   . Hypertension Sister   . Hypertension Brother   . Stroke Neg Hx     Social History Social History   Tobacco Use  . Smoking status: Former Smoker    Packs/day: 1.00    Years: 30.00    Pack years: 30.00    Types: Cigarettes    Start date: 10/03/1957    Last attempt to quit: 09/27/1990    Years since quitting: 27.1  . Smokeless tobacco: Never Used  Substance Use Topics  . Alcohol use: No    Alcohol/week: 0.0 oz  . Drug use: No  Allergies   Patient has no known allergies.   Review of Systems Review of Systems  All other systems reviewed and are negative.    Physical Exam Updated Vital Signs BP 116/60   Pulse 70   Temp 98.1 F (36.7 C) (Oral)   Resp 18   SpO2 97%   Physical Exam  Constitutional: He appears well-developed and well-nourished.  HENT:  Head: Normocephalic and atraumatic.  Right Ear: External ear normal.  Left Ear: External ear normal.  Nose: Nose normal.  Mouth/Throat: Uvula is midline, oropharynx is clear and moist and mucous membranes are normal. No tonsillar exudate.  Eyes: Pupils are equal, round, and reactive to light. Right eye exhibits no discharge. Left eye exhibits no discharge. No scleral icterus.  Neck: Trachea normal. Neck supple. No JVD present. No spinous process tenderness present. Carotid bruit is not present. No neck rigidity. Normal range of motion present.  Cardiovascular: Normal rate, regular rhythm and intact distal pulses.  No murmur heard. Pulses:      Radial pulses are 2+ on the right side, and 2+ on the left side.       Dorsalis pedis pulses are 2+ on the right side, and 2+ on the left side.       Posterior tibial pulses are 2+ on the right side, and 2+ on the left side.  No lower extremity swelling or edema. Calves symmetric in size bilaterally.  Pulmonary/Chest: Effort normal and breath sounds normal. He exhibits no tenderness.  No increased work of breathing. No  accessory muscle use. Patient is sitting upright, speaking in full sentences without difficulty   Abdominal: Soft. Bowel sounds are normal. There is no tenderness. There is no rebound and no guarding.  Musculoskeletal: He exhibits no edema.  Lymphadenopathy:    He has no cervical adenopathy.  Neurological: He is alert.  Skin: Skin is warm and dry. No rash noted. He is not diaphoretic.  Psychiatric: He has a normal mood and affect.  Nursing note and vitals reviewed.    ED Treatments / Results  Labs (all labs ordered are listed, but only abnormal results are displayed) Labs Reviewed  BASIC METABOLIC PANEL - Abnormal; Notable for the following components:      Result Value   Glucose, Bld 352 (*)    All other components within normal limits  CBC - Abnormal; Notable for the following components:   Platelets 121 (*)    All other components within normal limits  I-STAT TROPONIN, ED - Abnormal; Notable for the following components:   Troponin i, poc 0.26 (*)    All other components within normal limits    EKG  EKG Interpretation  Date/Time:  Sunday October 30 2017 10:45:23 EST Ventricular Rate:  73 PR Interval:    QRS Duration: 100 QT Interval:  394 QTC Calculation: 434 R Axis:   35 Text Interpretation:  Unusual P axis, possible ectopic atrial rhythm Anteroseptal infarct , age undetermined ST & T wave abnormality, consider inferolateral ischemia Abnormal ECG Confirmed by Tanna Furry [phone]) on 10/30/2017 1:14:27 PM       Radiology Dg Chest 2 View  Result Date: 10/30/2017 CLINICAL DATA:  Left chest pain, shortness of breath, fatigue, cough EXAM: CHEST  2 VIEW COMPARISON:  01/03/2015 FINDINGS: Mild bibasilar opacities, possibly atelectasis, although mild subpleural reticulation/fibrosis is favored. No focal consolidation. No pleural effusion or pneumothorax. The heart is top-normal in size. Postsurgical changes related to prior CABG. Degenerative changes of the visualized  thoracolumbar spine. Median  sternotomy. IMPRESSION: No evidence of acute cardiopulmonary disease. Electronically Signed   By: Julian Hy M.D.   On: 10/30/2017 11:28    Procedures Procedures (including critical care time) CRITICAL CARE Performed by: Jillyn Ledger   Total critical care time: 35 minutes  Critical care time was exclusive of separately billable procedures and treating other patients.  Critical care was necessary to treat or prevent imminent or life-threatening deterioration.  Critical care was time spent personally by me on the following activities: development of treatment plan with patient and/or surrogate as well as nursing, discussions with consultants, evaluation of patient's response to treatment, examination of patient, obtaining history from patient or surrogate, ordering and performing treatments and interventions, ordering and review of laboratory studies, ordering and review of radiographic studies, pulse oximetry and re-evaluation of patient's condition.   Medications Ordered in ED Medications  nitroGLYCERIN 50 mg in dextrose 5 % 250 mL (0.2 mg/mL) infusion (20 mcg/min Intravenous Rate/Dose Change 10/30/17 1530)  heparin ADULT infusion 100 units/mL (25000 units/250mL sodium chloride 0.45%) (1,200 Units/hr Intravenous New Bag/Given 10/30/17 1417)  aspirin chewable tablet 324 mg (324 mg Oral Given 10/30/17 1338)  heparin bolus via infusion 4,000 Units (4,000 Units Intravenous Bolus from Bag 10/30/17 1417)     Initial Impression / Assessment and Plan / ED Course  I have reviewed the triage vital signs and the nursing notes.  Pertinent labs & imaging results that were available during my care of the patient were reviewed by me and considered in my medical decision making (see chart for details).     77 year old male with known CAD followed by Dr. Harl Bowie presenting with exertional chest pain with associated shortness of breath, nausea, diaphoresis since Friday.   Patient notes that he has exertional chest pain even with small bouts of exertion.  He has not taken any nitroglycerin.  He is on a baby aspirin.  No plavix use.  Current chest pain rated as a 5/10.  Vital signs are reassuring on presentation.  Heart regular rate and rhythm.  Lungs clear to auscultation bilaterally. Patient with elevated troponin of 0.26.  EKG similar to prior.  Concern for NSTEMI given history & prior cath findigns.  Kidney function within normal limits labs otherwise reassuring. Patient given full dose asprin & IV heparin per pharmacy.  IV nitro started chest pain. Cardiology to see and admit the patient.   Patient and patient's wife are in agreement with plan.  Patient appears safe for admission.  Final Clinical Impressions(s) / ED Diagnoses   Final diagnoses:  NSTEMI (non-ST elevated myocardial infarction) [facility])    ED Discharge Orders    None       Lorelle Gibbs 10/30/17 1651    Tanna Furry, MD 10/31/17 2138    Jillyn Ledger, PA-C 11/09/17 1305    Tanna Furry, MD 11/12/17 [phone]

## 2017-10-30 NOTE — Progress Notes (Signed)
ANTICOAGULATION CONSULT NOTE - Initial Consult  Pharmacy Consult for heparin Indication: chest pain/ACS  No Known Allergies  Patient Measurements: TBW 99.8 kg Height: 5'9" Heparin Dosing Weight: 91 kg  Assessment: 77 yo M presents with CP and SOB. Pharmacy consulted to start heparin for ACS. No anticoag PTA. Hgb stable, plts low at 121.  Goal of Therapy:  Heparin level 0.3-0.7 units/ml Monitor platelets by anticoagulation protocol: Yes   Plan:  Give heparin 4,000 unit bolus Start heparin gtt at 1,200 units/hr Monitor daily heparin level, CBC, s/s of bleed   Leelynd Maldonado J 10/30/2017,1:21 PM

## 2017-10-30 NOTE — ED Triage Notes (Signed)
Patient complains of chest pain and shortness of breath x 2 days. Describes the pain as coming in spells. Has cardiology appointment tomorrow. Alert and oriented

## 2017-10-30 NOTE — ED Notes (Signed)
Dr.James made aware of pt istat lab results.

## 2017-10-30 NOTE — Progress Notes (Signed)
[facility] for Heparin Indication: chest pain/ACS  Allergies  Allergen Reactions  . Lipitor [Atorvastatin Calcium]     Myalgias     Patient Measurements: TBW 99.8 kg Height: 5'9" Heparin Dosing Weight: 91 kg  Assessment: 77 yo M presents with CP and SOB. Pharmacy consulted to start heparin for ACS. No anticoag PTA. Hgb stable, plts low at 121.  2/3 PM: initial heparin level is sub-therapeutic   Goal of Therapy:  Heparin level 0.3-0.7 units/ml Monitor platelets by anticoagulation protocol: Yes   Plan:  Inc heparin to 1400 units/hr Check heparin level in 8 hours  Narda Bonds 10/30/2017,11:10 PM

## 2017-10-31 ENCOUNTER — Ambulatory Visit: Payer: Medicare Other | Admitting: Cardiology

## 2017-10-31 ENCOUNTER — Encounter (HOSPITAL_COMMUNITY)
Admission: EM | Disposition: A | Discharge status: Home / Self Care | Payer: Self-pay | Source: Home / Self Care | Attending: Internal Medicine

## 2017-10-31 DIAGNOSIS — I25810 Atherosclerosis of coronary artery bypass graft(s) without angina pectoris: Secondary | ICD-10-CM

## 2017-10-31 HISTORY — PX: CORONARY STENT INTERVENTION: CATH118234

## 2017-10-31 HISTORY — PX: LEFT HEART CATH AND CORS/GRAFTS ANGIOGRAPHY: CATH118250

## 2017-10-31 LAB — COMPREHENSIVE METABOLIC PANEL
ALT: 17 U/L (ref 17–63)
AST: 22 U/L (ref 15–41)
Albumin: 3.4 g/dL — ABNORMAL LOW (ref 3.5–5.0)
Alkaline Phosphatase: 52 U/L (ref 38–126)
Anion gap: 12 (ref 5–15)
BUN: 9 mg/dL (ref 6–20)
CO2: 20 mmol/L — ABNORMAL LOW (ref 22–32)
Calcium: 8.7 mg/dL — ABNORMAL LOW (ref 8.9–10.3)
Chloride: 105 mmol/L (ref 101–111)
Creatinine, Ser: 0.79 mg/dL (ref 0.61–1.24)
GFR calc Af Amer: >60 mL/min (ref >60)
GFR calc non Af Amer: >60 mL/min (ref >60)
Glucose, Bld: 276 mg/dL — ABNORMAL HIGH (ref 65–99)
Potassium: 3.7 mmol/L (ref 3.5–5.1)
Sodium: 137 mmol/L (ref 135–145)
Total Bilirubin: 1 mg/dL (ref 0.3–1.2)
Total Protein: 6.1 g/dL — ABNORMAL LOW (ref 6.5–8.1)

## 2017-10-31 LAB — LIPID PANEL
Cholesterol: 109 mg/dL (ref 0–200)
HDL: 32 mg/dL — ABNORMAL LOW (ref >40)
LDL Cholesterol: 54 mg/dL (ref 0–99)
Total CHOL/HDL Ratio: 3.4 RATIO
Triglycerides: 115 mg/dL (ref <150)
VLDL: 23 mg/dL (ref 0–40)

## 2017-10-31 LAB — CBC
HCT: 37.7 % — ABNORMAL LOW (ref 39.0–52.0)
Hemoglobin: 12.9 g/dL — ABNORMAL LOW (ref 13.0–17.0)
MCH: 30.1 pg (ref 26.0–34.0)
MCHC: 34.2 g/dL (ref 30.0–36.0)
MCV: 87.9 fL (ref 78.0–100.0)
Platelets: 104 10*3/uL — ABNORMAL LOW (ref 150–400)
RBC: 4.29 MIL/uL (ref 4.22–5.81)
RDW: 13.6 % (ref 11.5–15.5)
WBC: 7.7 10*3/uL (ref 4.0–10.5)

## 2017-10-31 LAB — GLUCOSE, CAPILLARY
Glucose-Capillary: 269 mg/dL — ABNORMAL HIGH (ref 65–99)
Glucose-Capillary: 231 mg/dL — ABNORMAL HIGH (ref 65–99)
Glucose-Capillary: 232 mg/dL — ABNORMAL HIGH (ref 65–99)
Glucose-Capillary: 267 mg/dL — ABNORMAL HIGH (ref 65–99)

## 2017-10-31 LAB — HEPARIN LEVEL (UNFRACTIONATED): Heparin Unfractionated: 0.29 IU/mL — ABNORMAL LOW (ref 0.30–0.70)

## 2017-10-31 LAB — PROTIME-INR
INR: 1.1
Prothrombin Time: 14.2 seconds (ref 11.4–15.2)

## 2017-10-31 LAB — TROPONIN I: Troponin I: 0.3 ng/mL (ref <0.03)

## 2017-10-31 LAB — MRSA PCR SCREENING: MRSA by PCR: NEGATIVE

## 2017-10-31 LAB — POCT ACTIVATED CLOTTING TIME: Activated Clotting Time: 345 seconds

## 2017-10-31 SURGERY — LEFT HEART CATH AND CORS/GRAFTS ANGIOGRAPHY
Anesthesia: LOCAL

## 2017-10-31 MED ORDER — LABETALOL HCL 5 MG/ML IV SOLN: 10.0000 mg | INTRAVENOUS | Status: AC | PRN

## 2017-10-31 MED ORDER — HYDRALAZINE HCL 20 MG/ML IJ SOLN: 5.0000 mg | INTRAMUSCULAR | Status: AC | PRN

## 2017-10-31 MED ORDER — ADENOSINE (DIAGNOSTIC) FOR INTRACORONARY USE
INTRAVENOUS | Status: DC | PRN
  Administered 2017-10-31: 120 ug via INTRACORONARY
  Administered 2017-10-31: 60 ug via INTRACORONARY

## 2017-10-31 MED ORDER — MIDAZOLAM HCL 2 MG/2ML IJ SOLN
INTRAMUSCULAR | Status: AC
  Filled 2017-10-31: qty 2

## 2017-10-31 MED ORDER — SODIUM CHLORIDE 0.9 % IV SOLN: 250.0000 mL | INTRAVENOUS | Status: DC | PRN

## 2017-10-31 MED ORDER — TICAGRELOR 90 MG PO TABS
ORAL_TABLET | ORAL | Status: AC
  Filled 2017-10-31: qty 2

## 2017-10-31 MED ORDER — ACETAMINOPHEN 325 MG PO TABS: 650.0000 mg | ORAL_TABLET | ORAL | Status: DC | PRN

## 2017-10-31 MED ORDER — IOPAMIDOL (ISOVUE-370) INJECTION 76%
INTRAVENOUS | Status: DC | PRN
  Administered 2017-10-31: 130 mL via INTRA_ARTERIAL

## 2017-10-31 MED ORDER — MIDAZOLAM HCL 2 MG/2ML IJ SOLN
INTRAMUSCULAR | Status: DC | PRN
  Administered 2017-10-31: 1 mg via INTRAVENOUS

## 2017-10-31 MED ORDER — ATROPINE SULFATE 1 MG/10ML IJ SOSY
PREFILLED_SYRINGE | INTRAMUSCULAR | Status: AC
  Filled 2017-10-31: qty 10

## 2017-10-31 MED ORDER — HEPARIN (PORCINE) IN NACL 2-0.9 UNIT/ML-% IJ SOLN
INTRAMUSCULAR | Status: AC
  Filled 2017-10-31: qty 500

## 2017-10-31 MED ORDER — SODIUM CHLORIDE 0.9% FLUSH: 3.0000 mL | INTRAVENOUS | Status: DC | PRN

## 2017-10-31 MED ORDER — MORPHINE SULFATE (PF) 2 MG/ML IV SOLN
1.0000 mg | INTRAVENOUS | Status: DC | PRN
  Administered 2017-10-31: 4 mg via INTRAVENOUS
  Filled 2017-10-31: qty 2

## 2017-10-31 MED ORDER — BIVALIRUDIN TRIFLUOROACETATE 250 MG IV SOLR
INTRAVENOUS | Status: AC
  Filled 2017-10-31: qty 250

## 2017-10-31 MED ORDER — ASPIRIN 81 MG PO CHEW
81.0000 mg | CHEWABLE_TABLET | Freq: Every day | ORAL | Status: DC
  Administered 2017-11-01: 81 mg via ORAL
  Filled 2017-10-31: qty 1

## 2017-10-31 MED ORDER — ONDANSETRON HCL 4 MG/2ML IJ SOLN: 4.0000 mg | Freq: Four times a day (QID) | INTRAMUSCULAR | Status: DC | PRN

## 2017-10-31 MED ORDER — SODIUM CHLORIDE 0.9 % IV SOLN
INTRAVENOUS | Status: AC | PRN
  Administered 2017-10-31 (×2): 1.75 mg/kg/h via INTRAVENOUS

## 2017-10-31 MED ORDER — FENTANYL CITRATE (PF) 100 MCG/2ML IJ SOLN
INTRAMUSCULAR | Status: AC
  Filled 2017-10-31: qty 2

## 2017-10-31 MED ORDER — ADENOSINE 6 MG/2ML IV SOLN
INTRAVENOUS | Status: AC
  Filled 2017-10-31: qty 2

## 2017-10-31 MED ORDER — SODIUM CHLORIDE 0.9 % IV SOLN: INTRAVENOUS | Status: AC

## 2017-10-31 MED ORDER — SODIUM CHLORIDE 0.9% FLUSH
3.0000 mL | Freq: Two times a day (BID) | INTRAVENOUS | Status: DC
  Administered 2017-10-31 – 2017-11-01 (×2): 3 mL via INTRAVENOUS

## 2017-10-31 MED ORDER — BIVALIRUDIN BOLUS VIA INFUSION - CUPID
INTRAVENOUS | Status: DC | PRN
  Administered 2017-10-31: 72.675 mg via INTRAVENOUS

## 2017-10-31 MED ORDER — IOPAMIDOL (ISOVUE-370) INJECTION 76%
INTRAVENOUS | Status: AC
  Filled 2017-10-31: qty 100

## 2017-10-31 MED ORDER — IOPAMIDOL (ISOVUE-370) INJECTION 76%
INTRAVENOUS | Status: AC
  Filled 2017-10-31: qty 50

## 2017-10-31 MED ORDER — HEPARIN (PORCINE) IN NACL 2-0.9 UNIT/ML-% IJ SOLN
INTRAMUSCULAR | Status: AC | PRN
  Administered 2017-10-31: 1000 mL

## 2017-10-31 MED ORDER — NITROGLYCERIN 1 MG/10 ML FOR IR/CATH LAB
INTRA_ARTERIAL | Status: DC | PRN
  Administered 2017-10-31 (×3): 200 ug via INTRACORONARY

## 2017-10-31 MED ORDER — TICAGRELOR 90 MG PO TABS
90.0000 mg | ORAL_TABLET | Freq: Two times a day (BID) | ORAL | Status: DC
  Administered 2017-11-01: 90 mg via ORAL
  Filled 2017-10-31: qty 1

## 2017-10-31 MED ORDER — LIDOCAINE HCL 1 % IJ SOLN
INTRAMUSCULAR | Status: AC
  Filled 2017-10-31: qty 20

## 2017-10-31 MED ORDER — TICAGRELOR 90 MG PO TABS
ORAL_TABLET | ORAL | Status: DC | PRN
  Administered 2017-10-31: 180 mg via ORAL

## 2017-10-31 MED ORDER — FENTANYL CITRATE (PF) 100 MCG/2ML IJ SOLN
INTRAMUSCULAR | Status: DC | PRN
  Administered 2017-10-31 (×2): 25 ug via INTRAVENOUS

## 2017-10-31 MED ORDER — LIDOCAINE HCL (PF) 1 % IJ SOLN
INTRAMUSCULAR | Status: DC | PRN
  Administered 2017-10-31: 15 mL via SUBCUTANEOUS

## 2017-10-31 MED ORDER — NITROGLYCERIN 1 MG/10 ML FOR IR/CATH LAB
INTRA_ARTERIAL | Status: AC
  Filled 2017-10-31: qty 10

## 2017-10-31 SURGICAL SUPPLY — 22 items
BALLN SAPPHIRE 2.0X15 (BALLOONS) ×2
BALLOON SAPPHIRE 2.0X15 (BALLOONS) ×1 IMPLANT
CATH INFINITI 5 FR IM (CATHETERS) ×2 IMPLANT
CATH INFINITI 5FR MULTPACK ANG (CATHETERS) ×2 IMPLANT
CATH LAUNCHER 6FR AL.75 (CATHETERS) ×2 IMPLANT
CATH PRIORITY ONE AC 6F (CATHETERS) ×2 IMPLANT
COVER PRB 48X5XTLSCP FOLD TPE (BAG) ×1 IMPLANT
COVER PROBE 5X48 (BAG) ×2
KIT ENCORE 26 ADVANTAGE (KITS) ×2 IMPLANT
KIT HEART LEFT (KITS) ×2 IMPLANT
KIT HEMO VALVE WATCHDOG (MISCELLANEOUS) ×2 IMPLANT
PACK CARDIAC CATHETERIZATION (CUSTOM PROCEDURE TRAY) ×2 IMPLANT
SHEATH PINNACLE 5F 10CM (SHEATH) ×2 IMPLANT
SHEATH PINNACLE 6F 10CM (SHEATH) ×2 IMPLANT
STENT SYNERGY DES 3X24 (Permanent Stent) ×2 IMPLANT
STOPCOCK MORSE 400PSI 3WAY (MISCELLANEOUS) ×2 IMPLANT
SYR MEDRAD MARK V 150ML (SYRINGE) ×2 IMPLANT
TRANSDUCER W/STOPCOCK (MISCELLANEOUS) ×2 IMPLANT
TUBING CIL FLEX 10 FLL-RA (TUBING) ×2 IMPLANT
WIRE ASAHI PROWATER 180CM (WIRE) ×2 IMPLANT
WIRE EMERALD 3MM-J .035X260CM (WIRE) ×2 IMPLANT
WIRE HITORQ VERSACORE ST 145CM (WIRE) ×2 IMPLANT

## 2017-10-31 NOTE — Progress Notes (Signed)
Back from the cath lab awake and alert. Sheath to right groin intact.

## 2017-10-31 NOTE — Progress Notes (Signed)
Transported to the cath. Lab by bed. 

## 2017-10-31 NOTE — Progress Notes (Signed)
Inpatient Diabetes Program Recommendations  AACE/ADA: New Consensus Statement on Inpatient Glycemic Control (2015)  Target Ranges:  Prepandial:   less than 140 mg/dL      Peak postprandial:   less than 180 mg/dL (1-2 hours)      Critically ill patients:  140 - 180 mg/dL   Lab Results  Component Value Date   GLUCAP 269 (H) 10/31/2017   HGBA1C 8.2 (H) 01/03/2015    Review of Glycemic Control Results for Troy [name], Troy [name] (MRN [id number]) as of 10/31/2017 10:03  Ref. Range 10/30/2017 21:02 10/31/2017 07:41  Glucose-Capillary Latest Ref Range: 65 - 99 mg/dL 266 (H) 269 (H)   Diabetes history: DM2 Outpatient Diabetes medications: Amaryl 4 + Saxagliptin-Metformin 5-1gm Current orders for Inpatient glycemic control: Amaryl 4 +Novolog correction moderate tid  Inpatient Diabetes Program Recommendations:    -Hold oral DM medications while in the hospital -Lantus 19 units daily while oral meds held -Add Novolog 0-5 units hs  Thank you, Nani Gasser. Vedanshi Massaro, RN, MSN, CDE  Diabetes Coordinator Inpatient Glycemic Control Team Team Pager [phone] (8am-5pm) 10/31/2017 10:09 AM

## 2017-10-31 NOTE — Interval H&P Note (Signed)
Cath Lab Visit (complete for each Cath Lab visit)  Clinical Evaluation Leading to the Procedure:   ACS: Yes.    Non-ACS:    Anginal Classification: CCS IV  Anti-ischemic medical therapy: Minimal Therapy (1 class of medications)  Non-Invasive Test Results: No non-invasive testing performed  Prior CABG: Previous CABG      History and Physical Interval Note:  10/31/2017 1:49 PM  Troy [name]  has presented today for surgery, with the diagnosis of NSTEMI  The various methods of treatment have been discussed with the patient and family. After consideration of risks, benefits and other options for treatment, the patient has consented to  Procedure(s): LEFT HEART CATH AND CORS/GRAFTS ANGIOGRAPHY (N/A) as a surgical intervention .  The patient's history has been reviewed, patient examined, no change in status, stable for surgery.  I have reviewed the patient's chart and labs.  Questions were answered to the patient's satisfaction.     Larae Grooms

## 2017-10-31 NOTE — H&P (View-Only) (Signed)
Progress Note  Patient Name: Troy [name] Date of Encounter: 10/31/2017  Primary Cardiologist: Harl Bowie  Subjective   Feeling well this morning. No chest pain.   Inpatient Medications    Scheduled Meds: . amLODipine  10 mg Oral Daily  . aspirin EC  81 mg Oral Daily  . carvedilol  6.25 mg Oral BID WC  . glimepiride  4 mg Oral QAC breakfast  . insulin aspart  0-15 Units Subcutaneous TID WC  . losartan  100 mg Oral Daily  . simvastatin  40 mg Oral q1800  . sodium chloride flush  3 mL Intravenous Q12H  . sodium chloride flush  3 mL Intravenous Q12H   Continuous Infusions: . sodium chloride    . sodium chloride    . sodium chloride 50 mL/hr (10/31/17 0621)  . heparin 1,500 Units/hr (10/31/17 1021)  . nitroGLYCERIN 20 mcg/min (10/31/17 0900)   PRN Meds: sodium chloride, sodium chloride, acetaminophen, nitroGLYCERIN, ondansetron (ZOFRAN) IV, sodium chloride flush, sodium chloride flush   Vital Signs    Vitals:   10/31/17 0000 10/31/17 0400 10/31/17 0422 10/31/17 0738  BP: 139/66 (!) 142/80  132/60  Pulse: 73 74  71  Resp: 18 20  17   Temp:    98 F (36.7 C)  TempSrc:    Oral  SpO2: 93% 97%  96%  Weight:   213 lb 10 oz (96.9 kg)   Height:        Intake/Output Summary (Last 24 hours) at 10/31/2017 1054 Last data filed at 10/31/2017 0900 Gross per 24 hour  Intake 773.8 ml  Output 2375 ml  Net -1601.2 ml   Filed Weights   10/30/17 1738 10/31/17 0422  Weight: 220 lb 0.3 oz (99.8 kg) 213 lb 10 oz (96.9 kg)    Telemetry    SR - Personally Reviewed  ECG    SR with known TWI in lateral leads - Personally Reviewed  Physical Exam   General: Well developed, well nourished, male appearing in no acute distress. Head: Normocephalic, atraumatic.  Neck: Supple without bruits, JVD. Lungs:  Resp regular and unlabored, CTA. Heart: RRR, S1, S2, no S3, S4, or murmur; no rub. Abdomen: Soft, non-tender, non-distended with normoactive bowel sounds.  Extremities: No  clubbing, cyanosis, edema. Distal pedal pulses are 2+ bilaterally. Neuro: Alert and oriented X 3. Moves all extremities spontaneously. Psych: Normal affect.  Labs    Chemistry Recent Labs  Lab 10/30/17 1102 10/31/17 0302  NA 135 137  K 4.2 3.7  CL 103 105  CO2 22 20*  GLUCOSE 352* 276*  BUN 12 9  CREATININE 0.87 0.79  CALCIUM 9.4 8.7*  PROT  --  6.1*  ALBUMIN  --  3.4*  AST  --  22  ALT  --  17  ALKPHOS  --  52  BILITOT  --  1.0  GFRNONAA >60 >60  GFRAA >60 >60  ANIONGAP 10 12     Hematology Recent Labs  Lab 10/30/17 1102 10/31/17 0302  WBC 8.8 7.7  RBC 4.51 4.29  HGB 14.0 12.9*  HCT 39.5 37.7*  MCV 87.6 87.9  MCH 31.0 30.1  MCHC 35.4 34.2  RDW 13.6 13.6  PLT 121* 104*    Cardiac EnzymesNo results for input(s): TROPONINI in the last 168 hours.  Recent Labs  Lab 10/30/17 1117  TROPIPOC 0.26*     BNPNo results for input(s): BNP, PROBNP in the last 168 hours.   DDimer No results for input(s): DDIMER in the  last 168 hours.    Radiology    Dg Chest 2 View  Result Date: 10/30/2017 CLINICAL DATA:  Left chest pain, shortness of breath, fatigue, cough EXAM: CHEST  2 VIEW COMPARISON:  01/03/2015 FINDINGS: Mild bibasilar opacities, possibly atelectasis, although mild subpleural reticulation/fibrosis is favored. No focal consolidation. No pleural effusion or pneumothorax. The heart is top-normal in size. Postsurgical changes related to prior CABG. Degenerative changes of the visualized thoracolumbar spine. Median sternotomy. IMPRESSION: No evidence of acute cardiopulmonary disease. Electronically Signed   By: Julian Hy M.D.   On: 10/30/2017 11:28    Cardiac Studies   N/a   Patient Profile     77 y.o. male prior history of CAD status post CABG x 5 in 1996, ischemic cardiomyopathy with an EF of 42%, hypertension, hyperlipidemia, GERD, diabetes, peripheral arterial disease, and thrombocytopenia, who presented to the ED on February 2/3 with chest pain and  non-STEMI.  Assessment & Plan    1. NSTEMI: Trop on admit 0.26. Remains on IV heparin and nitro. No chest pain. Planned for cardiac cath today. Will check another set of trops this morning.  -- on ASA, statin, BB  2. CAD s/p CABG ('96): Had cath in 2016 with patent LIMA-LAD, SVG-RPDA, and SVG-RI with SVG- OM/dLCX occluded. 70% in native LCx with planned medical therapy. Planned for cath today.  3. ICM: Echo 4/16 with EF of 50-55% with no WMA and G1DD. No signs of HF on exam.  4. HL: LDL 54 on Zocor. Reports intolerance to high dose statins in the past.   5. HTN: Stable with current therapy  6. DM: Metformin held, on SSI while inpatient  7. Thrombocytopenia: Platelets 121>>104 on heparin.   Signed, Reino Bellis, NP  10/31/2017, 10:54 AM  Pager # [phone]   I have examined the patient and reviewed assessment and plan and discussed with patient.  Agree with above as stated.  No CP now.  All questions about cath answered.  Plan for cath later today.  LDL below 70 at this time.  COntinue aggressive medical therapy.   Larae Grooms   For questions or updates, please contact [facility] Please consult [email or web address] for contact info under Cardiology/STEMI.

## 2017-10-31 NOTE — Progress Notes (Signed)
Right femoral sheath removed with catheter intact, tolerated well. Manual pressure applied for 20 min.no bleeding nor hematoma noted.pt. denied any discomfort. Instructed not to bend right knee. When sneezing , coughing  advised to put pressure on the site.Bedrest emphasized.continue to monitor.

## 2017-10-31 NOTE — Progress Notes (Signed)
Progress Note  Patient Name: Troy [name] Date of Encounter: 10/31/2017  Primary Cardiologist: Harl Bowie  Subjective   Feeling well this morning. No chest pain.   Inpatient Medications    Scheduled Meds: . amLODipine  10 mg Oral Daily  . aspirin EC  81 mg Oral Daily  . carvedilol  6.25 mg Oral BID WC  . glimepiride  4 mg Oral QAC breakfast  . insulin aspart  0-15 Units Subcutaneous TID WC  . losartan  100 mg Oral Daily  . simvastatin  40 mg Oral q1800  . sodium chloride flush  3 mL Intravenous Q12H  . sodium chloride flush  3 mL Intravenous Q12H   Continuous Infusions: . sodium chloride    . sodium chloride    . sodium chloride 50 mL/hr (10/31/17 0621)  . heparin 1,500 Units/hr (10/31/17 1021)  . nitroGLYCERIN 20 mcg/min (10/31/17 0900)   PRN Meds: sodium chloride, sodium chloride, acetaminophen, nitroGLYCERIN, ondansetron (ZOFRAN) IV, sodium chloride flush, sodium chloride flush   Vital Signs    Vitals:   10/31/17 0000 10/31/17 0400 10/31/17 0422 10/31/17 0738  BP: 139/66 (!) 142/80  132/60  Pulse: 73 74  71  Resp: 18 20  17   Temp:    98 F (36.7 C)  TempSrc:    Oral  SpO2: 93% 97%  96%  Weight:   213 lb 10 oz (96.9 kg)   Height:        Intake/Output Summary (Last 24 hours) at 10/31/2017 1054 Last data filed at 10/31/2017 0900 Gross per 24 hour  Intake 773.8 ml  Output 2375 ml  Net -1601.2 ml   Filed Weights   10/30/17 1738 10/31/17 0422  Weight: 220 lb 0.3 oz (99.8 kg) 213 lb 10 oz (96.9 kg)    Telemetry    SR - Personally Reviewed  ECG    SR with known TWI in lateral leads - Personally Reviewed  Physical Exam   General: Well developed, well nourished, male appearing in no acute distress. Head: Normocephalic, atraumatic.  Neck: Supple without bruits, JVD. Lungs:  Resp regular and unlabored, CTA. Heart: RRR, S1, S2, no S3, S4, or murmur; no rub. Abdomen: Soft, non-tender, non-distended with normoactive bowel sounds.  Extremities: No  clubbing, cyanosis, edema. Distal pedal pulses are 2+ bilaterally. Neuro: Alert and oriented X 3. Moves all extremities spontaneously. Psych: Normal affect.  Labs    Chemistry Recent Labs  Lab 10/30/17 1102 10/31/17 0302  NA 135 137  K 4.2 3.7  CL 103 105  CO2 22 20*  GLUCOSE 352* 276*  BUN 12 9  CREATININE 0.87 0.79  CALCIUM 9.4 8.7*  PROT  --  6.1*  ALBUMIN  --  3.4*  AST  --  22  ALT  --  17  ALKPHOS  --  52  BILITOT  --  1.0  GFRNONAA >60 >60  GFRAA >60 >60  ANIONGAP 10 12     Hematology Recent Labs  Lab 10/30/17 1102 10/31/17 0302  WBC 8.8 7.7  RBC 4.51 4.29  HGB 14.0 12.9*  HCT 39.5 37.7*  MCV 87.6 87.9  MCH 31.0 30.1  MCHC 35.4 34.2  RDW 13.6 13.6  PLT 121* 104*    Cardiac EnzymesNo results for input(s): TROPONINI in the last 168 hours.  Recent Labs  Lab 10/30/17 1117  TROPIPOC 0.26*     BNPNo results for input(s): BNP, PROBNP in the last 168 hours.   DDimer No results for input(s): DDIMER in the  last 168 hours.    Radiology    Dg Chest 2 View  Result Date: 10/30/2017 CLINICAL DATA:  Left chest pain, shortness of breath, fatigue, cough EXAM: CHEST  2 VIEW COMPARISON:  01/03/2015 FINDINGS: Mild bibasilar opacities, possibly atelectasis, although mild subpleural reticulation/fibrosis is favored. No focal consolidation. No pleural effusion or pneumothorax. The heart is top-normal in size. Postsurgical changes related to prior CABG. Degenerative changes of the visualized thoracolumbar spine. Median sternotomy. IMPRESSION: No evidence of acute cardiopulmonary disease. Electronically Signed   By: Julian Hy M.D.   On: 10/30/2017 11:28    Cardiac Studies   N/a   Patient Profile     77 y.o. male prior history of CAD status post CABG x 5 in 1996, ischemic cardiomyopathy with an EF of 42%, hypertension, hyperlipidemia, GERD, diabetes, peripheral arterial disease, and thrombocytopenia, who presented to the ED on February 2/3 with chest pain and  non-STEMI.  Assessment & Plan    1. NSTEMI: Trop on admit 0.26. Remains on IV heparin and nitro. No chest pain. Planned for cardiac cath today. Will check another set of trops this morning.  -- on ASA, statin, BB  2. CAD s/p CABG ('96): Had cath in 2016 with patent LIMA-LAD, SVG-RPDA, and SVG-RI with SVG- OM/dLCX occluded. 70% in native LCx with planned medical therapy. Planned for cath today.  3. ICM: Echo 4/16 with EF of 50-55% with no WMA and G1DD. No signs of HF on exam.  4. HL: LDL 54 on Zocor. Reports intolerance to high dose statins in the past.   5. HTN: Stable with current therapy  6. DM: Metformin held, on SSI while inpatient  7. Thrombocytopenia: Platelets 121>>104 on heparin.   Signed, Reino Bellis, NP  10/31/2017, 10:54 AM  Pager # [phone]   I have examined the patient and reviewed assessment and plan and discussed with patient.  Agree with above as stated.  No CP now.  All questions about cath answered.  Plan for cath later today.  LDL below 70 at this time.  COntinue aggressive medical therapy.   Larae Grooms   For questions or updates, please contact [facility] Please consult [email or web address] for contact info under Cardiology/STEMI.

## 2017-10-31 NOTE — Progress Notes (Signed)
[place] for Heparin Indication: chest pain/ACS  Allergies  Allergen Reactions  . Lipitor [Atorvastatin Calcium]     Myalgias     Patient Measurements: TBW 99.8 kg Height: 5'9" Heparin Dosing Weight: 91 kg  Assessment: 77 yo M presents with CP and SOB. Pharmacy consulted to start heparin for ACS.  Heparin level 0.29, slightly subtherapeutic on 1400 units/hr. hgb 12.9, pltc 104K, sl down. Plan for cath today at around noon  Goal of Therapy:  Heparin level 0.3-0.7 units/ml Monitor platelets by anticoagulation protocol: Yes   Plan:  Increase heparin to 1500 units/hr Check heparin level in 8 hours F/u after cath  Maryanna Shape, PharmD, BCPS  Clinical Pharmacist  Pager: [phone]   10/31/2017,10:12 AM

## 2017-11-01 ENCOUNTER — Encounter (HOSPITAL_COMMUNITY): Payer: Self-pay | Admitting: Interventional Cardiology

## 2017-11-01 ENCOUNTER — Telehealth: Payer: Self-pay

## 2017-11-01 DIAGNOSIS — E1159 Type 2 diabetes mellitus with other circulatory complications: Secondary | ICD-10-CM

## 2017-11-01 DIAGNOSIS — I10 Essential (primary) hypertension: Secondary | ICD-10-CM

## 2017-11-01 DIAGNOSIS — E782 Mixed hyperlipidemia: Secondary | ICD-10-CM

## 2017-11-01 LAB — BASIC METABOLIC PANEL
Anion gap: 10 (ref 5–15)
BUN: 10 mg/dL (ref 6–20)
CO2: 24 mmol/L (ref 22–32)
Calcium: 9 mg/dL (ref 8.9–10.3)
Chloride: 104 mmol/L (ref 101–111)
Creatinine, Ser: 0.91 mg/dL (ref 0.61–1.24)
GFR calc Af Amer: >60 mL/min (ref >60)
GFR calc non Af Amer: >60 mL/min (ref >60)
Glucose, Bld: 244 mg/dL — ABNORMAL HIGH (ref 65–99)
Potassium: 3.8 mmol/L (ref 3.5–5.1)
Sodium: 138 mmol/L (ref 135–145)

## 2017-11-01 LAB — CBC
HCT: 40 % (ref 39.0–52.0)
Hemoglobin: 13.6 g/dL (ref 13.0–17.0)
MCH: 30.3 pg (ref 26.0–34.0)
MCHC: 34 g/dL (ref 30.0–36.0)
MCV: 89.1 fL (ref 78.0–100.0)
Platelets: 129 10*3/uL — ABNORMAL LOW (ref 150–400)
RBC: 4.49 MIL/uL (ref 4.22–5.81)
RDW: 13.8 % (ref 11.5–15.5)
WBC: 9.8 10*3/uL (ref 4.0–10.5)

## 2017-11-01 LAB — GLUCOSE, CAPILLARY: Glucose-Capillary: 237 mg/dL — ABNORMAL HIGH (ref 65–99)

## 2017-11-01 MED ORDER — TICAGRELOR 90 MG PO TABS: 90.0000 mg | ORAL_TABLET | Freq: Two times a day (BID) | ORAL | 2 refills | Status: DC

## 2017-11-01 NOTE — Telephone Encounter (Signed)
Patient contacted regarding discharge from [facility] on 11/01/17.  Patient understands to follow up with provider Strader PA-C on 11/14/17 at 3:30 pm at [facility]. Patient understands discharge instructions? yes Patient understands medications and regiment? yes Patient understands to bring all medications to this visit? Yes   I spoke with wife as pt was getting his discharge instructions

## 2017-11-01 NOTE — Telephone Encounter (Signed)
-----   Message from Desma Paganini sent at 11/01/2017 10:55 AM EST ----- TOC scheduled w/ B. Strader on 11/14/17  Thank you

## 2017-11-01 NOTE — Discharge Summary (Addendum)
Discharge Summary    Patient ID: Troy [name],  MRN: [id number], DOB/AGE: 1940-12-21 77 y.o.  Admit date: 10/30/2017 Discharge date: 11/01/2017  Primary Care Provider: Reynold Bowen Primary Cardiologist: Harl Bowie  Discharge Diagnoses    Active Problems:   NSTEMI (non-ST elevated myocardial infarction) [facility])   DM type 2 (diabetes mellitus, type 2) ([place])   Essential hypertension   Coronary atherosclerosis   Thrombocytopenia (HCC)   Allergies Allergies  Allergen Reactions  . Lipitor [Atorvastatin Calcium]     Myalgias     Diagnostic Studies/Procedures    Cath: 10/31/17  Conclusion     Ost Cx to Mid Cx lesion is 75% stenosed and appears unchanged from prior. THe SVG to circ is occluded  Prox LAD lesion is 100% stenosed. LIMA to LAD is patent.  Mid RCA lesion is 100% stenosed. SVG to PDA is patent.  Ramus-1 lesion is 100% stenosed. SVG to ramus is patent proximally. Dist Graft lesion is 99% stenosed.  A drug-eluting stent was successfully placed using a STENT SYNERGY DES 3X24, after aspiration thrombectomy.  Post intervention, there is a 0% residual stenosis.  Ramus-2 lesion is 80% stenosed. Balloon angioplasty was performed using a BALLOON SAPPHIRE 2.0X15.  Post intervention, there is a 10% residual stenosis.  LV end diastolic pressure is mildly elevated.  There is no aortic valve stenosis.   Continue IV angiomax for 1 hour post procedure.  Continue aspirin and Brilinta for 1 year along with aggressive secondary prevention.  _____________   History of Present Illness     77 year old male with the above complex past medical history including coronary artery disease status post prior CABG x 5 in 1996, hypertension, hyperlipidemia, diabetes, obesity, peripheral arterial disease, thrombocytopenia, GERD, and ischemic cardiomyopathy.  His last diagnostic catheterization was in April 2016, at which time his LIMA to the LAD, VG  RPDA, and VG  RI were patent,  while the sequential vein graft to the obtuse marginal and distal circumflex was occluded.  He did have 70% stenosis in the native LCX but in the absence of active angina and with h/o thrombocytopenia, medical rx was recommended.  Of note, EF on echo prior to cath was read as 50-55% but was 45-40% by left ventriculography.  Cardiac MRI in 01/2015 showed an EF of 42%.  Pt has since been followed by Dr. Harl Bowie in our [place] office.  He was last seen in 09/2016, at which time he was doing well.  He participates in water aerobics most days of the week and has been tolerating that well without any significant symptoms or limitations.  He noted that occasionally, he would have a brief or mild episode of chest discomfort, which he did not give very much thought to.  He was in his usual state of health until the morning of February 1, when he was at water aerobics and had sudden onset of substernal chest tightness associated with dyspnea, wheezing, and dizziness.  He said he felt drunk and like the room was spinning.  He left the aerobics class, showered, and changed and then went and sat outside.  Within about 30 minutes, he was feeling better and he and his wife went home.  Though dyspnea and dizziness resolved, but he continued to have moderate left-sided chest pain.  This persisted throughout the day on Friday.  When he woke up Saturday morning, he initially felt well but shortly thereafter, again noted left-sided chest discomfort without associated symptoms while at rest.  With  any amount of activity, chest pain worsened and became associated with dyspnea.  He also noted marked fatigue with any activity.  Symptoms persisted throughout the day on February 2 and he again noted symptoms this morning.  This prompted him to present to the emergency department. In the ED, ECG was nonacute however, his initial troponin was mildly elevated at 0.26.  He has been treated with IV nitroglycerin and heparin. He was admitted with  plans for cath.   Hospital Course     Troponin peaked at 0.30. Underwent cardiac cath noted above with successful thrombectomy and PCI/DESx1 to the SVG to Ramus. Plan for DAPT with ASA/Brilinta. Post cath labs were stable. He was able to ambulate without any recurrent chest pain with cardiac rehab.   Amedio D Richison was seen by Dr. Irish Lack and determined stable for discharge home. Follow up in the office has been arranged. Medications are listed below.   _____________  Discharge Vitals Blood pressure 128/63, pulse 73, temperature 99.1 F (37.3 C), temperature source Oral, resp. rate 17, height 5\' 9"  (1.753 m), weight 212 lb 8.4 oz (96.4 kg), SpO2 90 %.  Filed Weights   10/30/17 1738 10/31/17 0422 11/01/17 0312  Weight: 220 lb 0.3 oz (99.8 kg) 213 lb 10 oz (96.9 kg) 212 lb 8.4 oz (96.4 kg)    Labs & Radiologic Studies    CBC Recent Labs    10/31/17 0302 11/01/17 0303  WBC 7.7 9.8  HGB 12.9* 13.6  HCT 37.7* 40.0  MCV 87.9 89.1  PLT 104* 382*   Basic Metabolic Panel Recent Labs    10/31/17 0302 11/01/17 0303  NA 137 138  K 3.7 3.8  CL 105 104  CO2 20* 24  GLUCOSE 276* 244*  BUN 9 10  CREATININE 0.79 0.91  CALCIUM 8.7* 9.0   Liver Function Tests Recent Labs    10/31/17 0302  AST 22  ALT 17  ALKPHOS 52  BILITOT 1.0  PROT 6.1*  ALBUMIN 3.4*   No results for input(s): LIPASE, AMYLASE in the last 72 hours. Cardiac Enzymes Recent Labs    10/31/17 1106  TROPONINI 0.30*   BNP Invalid input(s): POCBNP D-Dimer No results for input(s): DDIMER in the last 72 hours. Hemoglobin A1C No results for input(s): HGBA1C in the last 72 hours. Fasting Lipid Panel Recent Labs    10/31/17 0302  CHOL 109  HDL 32*  LDLCALC 54  TRIG 115  CHOLHDL 3.4   Thyroid Function Tests No results for input(s): TSH, T4TOTAL, T3FREE, THYROIDAB in the last 72 hours.  Invalid input(s): FREET3 _____________  Dg Chest 2 View  Result Date: 10/30/2017 CLINICAL DATA:  Left chest  pain, shortness of breath, fatigue, cough EXAM: CHEST  2 VIEW COMPARISON:  01/03/2015 FINDINGS: Mild bibasilar opacities, possibly atelectasis, although mild subpleural reticulation/fibrosis is favored. No focal consolidation. No pleural effusion or pneumothorax. The heart is top-normal in size. Postsurgical changes related to prior CABG. Degenerative changes of the visualized thoracolumbar spine. Median sternotomy. IMPRESSION: No evidence of acute cardiopulmonary disease. Electronically Signed   By: Julian Hy M.D.   On: 10/30/2017 11:28   Disposition   Pt is being discharged home today in good condition.  Follow-up Plans & Appointments    Follow-up Information    Erma Heritage, PA-C Follow up on 11/14/2017.   Specialties:  Physician Assistant, Cardiology Why:  at 3:30pm for your follow up appt.  Contact information: [address] [place] [address] [phone]  Discharge Instructions    Amb Referral to Cardiac Rehabilitation   Complete by:  As directed    Diagnosis:   Coronary Stents NSTEMI PTCA     Call MD for:  redness, tenderness, or signs of infection (pain, swelling, redness, odor or green/yellow discharge around incision site)   Complete by:  As directed    Diet - low sodium heart healthy   Complete by:  As directed    Discharge instructions   Complete by:  As directed    Groin Site Care Refer to this sheet in the next few weeks. These instructions provide you with information on caring for yourself after your procedure. Your caregiver may also give you more specific instructions. Your treatment has been planned according to current medical practices, but problems sometimes occur. Call your caregiver if you have any problems or questions after your procedure. HOME CARE INSTRUCTIONS You may shower 24 hours after the procedure. Remove the bandage (dressing) and gently wash the site with plain soap and water. Gently pat the site dry.  Do not apply  powder or lotion to the site.  Do not sit in a bathtub, swimming pool, or whirlpool for 5 to 7 days.  No bending, squatting, or lifting anything over 10 pounds (4.5 kg) as directed by your caregiver.  Inspect the site at least twice daily.  Do not drive home if you are discharged the same day of the procedure. Have someone else drive you.  You may drive 24 hours after the procedure unless otherwise instructed by your caregiver.  What to expect: Any bruising will usually fade within 1 to 2 weeks.  Blood that collects in the tissue (hematoma) may be painful to the touch. It should usually decrease in size and tenderness within 1 to 2 weeks.  SEEK IMMEDIATE MEDICAL CARE IF: You have unusual pain at the groin site or down the affected leg.  You have redness, warmth, swelling, or pain at the groin site.  You have drainage (other than a small amount of blood on the dressing).  You have chills.  You have a fever or persistent symptoms for more than 72 hours.  You have a fever and your symptoms suddenly get worse.  Your leg becomes pale, cool, tingly, or numb.  You have heavy bleeding from the site. Hold pressure on the site. Marland Kitchen  PLEASE DO NOT MISS ANY DOSES OF YOUR BRILINTA!!!!! Also keep a log of you blood pressures and bring back to your follow up appt. Please call the office with any questions.   Patients taking blood thinners should generally stay away from medicines like ibuprofen, Advil, Motrin, naproxen, and Aleve due to risk of stomach bleeding. You may take Tylenol as directed or talk to your primary doctor about alternatives.   Increase activity slowly   Complete by:  As directed       Discharge Medications     Medication List    TAKE these medications   amLODipine 10 MG tablet Commonly known as:  NORVASC Take 1 tablet (10 mg total) by mouth daily.   aspirin 81 MG EC tablet Take 81 mg by mouth daily.   carvedilol 6.25 MG tablet Commonly known as:  COREG TAKE ONE TABLET BY  MOUTH TWICE DAILY WITH MEALS What changed:    how much to take  how to take this  when to take this   fish oil-omega-3 fatty acids 1000 MG capsule Take 2 g by mouth daily.   glimepiride 2  MG tablet Commonly known as:  AMARYL Take 4 mg by mouth daily before breakfast.   isosorbide mononitrate 30 MG 24 hr tablet Commonly known as:  IMDUR TAKE ONE TABLET BY MOUTH ONCE DAILY What changed:    how much to take  how to take this  when to take this   KOMBIGLYZE XR 01-999 MG Tb24 Generic drug:  Saxagliptin-Metformin Take 1 tablet by mouth daily.   losartan 100 MG tablet Commonly known as:  COZAAR Take 1 tablet (100 mg total) by mouth daily.   nitroGLYCERIN 0.4 MG/SPRAY spray Commonly known as:  NITROLINGUAL Place 1 spray under the tongue every 5 (five) minutes x 3 doses as needed for chest pain.   OYSTER SHELL CALCIUM 500 + D PO Take 1 tablet by mouth daily.   simvastatin 40 MG tablet Commonly known as:  ZOCOR TAKE 1 TABLET BY MOUTH ONCE DAILY   ticagrelor 90 MG Tabs tablet Commonly known as:  BRILINTA Take 1 tablet (90 mg total) by mouth 2 (two) times daily.   vitamin C 1000 MG tablet Take 1,000 mg by mouth daily.   zolpidem 10 MG tablet Commonly known as:  AMBIEN Take 1 tablet by mouth at bedtime.        Aspirin prescribed at discharge?  Yes High Intensity Statin Prescribed? (Lipitor 40-80mg  or Crestor 20-40mg ): No: On Zocor 2/2 intolerance. Beta Blocker Prescribed? Yes For EF <40%, was ACEI/ARB Prescribed? Yes ADP Receptor Inhibitor Prescribed? (i.e. Plavix etc.-Includes Medically Managed Patients): Yes For EF <40%, Aldosterone Inhibitor Prescribed? No:  Was EF assessed during THIS hospitalization? No: Consider echo as outpatient Was Cardiac Rehab II ordered? (Included Medically managed Patients): Yes   Outstanding Labs/Studies   N/a   Duration of Discharge Encounter   Greater than 30 minutes including physician time.  Signed, Reino Bellis  NP-C 11/01/2017, 11:15 AM   I have examined the patient and reviewed assessment and plan and discussed with patient.  Agree with above as stated.  S/p PCI of SVG to ramus.  Stressed importance of DAPT.  Continue secondary prevention with statins, beta blocker, ARB as well.  COntinue regular exercise.  Groin precautions given to minimize bleeding.  Continue BP lowering therapy and lipid lowering therapy.  EF not well discerned by hand injection ventriculogram, lilkely approximately 50%.  Wanted to avoid extra contrast so power injection was not done.   Plan d/c and cardiology f/u with Dr. Harl Bowie.      Larae Grooms

## 2017-11-01 NOTE — Care Management Note (Signed)
Case Management Note  Patient Details  Name: Troy [name] MRN: [id number] Date of Birth: [birth year]  Subjective/Objective:        Pt admitted with NSTEMI            Action/Plan:  PTA independent from home with wife.  Pt will discharge home on Brilinta - CM provided free 30day card and submitted benefit check.  CM contacted pharmacy of choice [facility] in [place] and confirmed pharmacy can fill prescription today   Expected Discharge Date:                  Expected Discharge Plan:  Home/Self Care(Independent from home with wife)  In-House Referral:     Discharge planning Services  CM Consult  Post Acute Care Choice:    Choice offered to:     DME Arranged:    DME Agency:     HH Arranged:    [place] Agency:     Status of Service:  In process, will continue to follow  If discussed at Long Length of Stay Meetings, dates discussed:    Additional Comments:  Maryclare Labrador, RN 11/01/2017, 10:35 AM

## 2017-11-01 NOTE — Progress Notes (Signed)
CARDIAC REHAB PHASE I   PRE:  Rate/Rhythm: 78 first deg    BP: sitting 124/55    SaO2: 97 RA  MODE:  Ambulation: 430 ft   POST:  Rate/Rhythm: 93 first deg    BP: sitting 135/63     SaO2: 95 RA  Pt able to walk, was unsteady initially, staggered to right x2. Better with distance. Had been in bed 2 days. Pt DOE with distance. Sts this is fairly normal for him. He likes to swim instead of walking. Ed completed with pt and wife. Understand importance of Brilinta/ASA. Will refer to [place]. Pt sts he likes to eat and encouraged him to watch portions for his DM and low sodium diet.  [place], ACSM 11/01/2017 10:25 AM

## 2017-11-01 NOTE — Progress Notes (Signed)
Pt has bruising at right groin cath site, Level "1". MD at bedside and aware. Pt denies pain at this time.

## 2017-11-01 NOTE — Progress Notes (Signed)
Progress Note  Patient Name: Troy [name] Date of Encounter: 11/01/2017  Primary Cardiologist: Carlyle Dolly, MD   Subjective   No chest pain; Had some bruising develop in the right groin after getting up at 4AM.    Inpatient Medications    Scheduled Meds: . amLODipine  10 mg Oral Daily  . aspirin  81 mg Oral Daily  . carvedilol  6.25 mg Oral BID WC  . glimepiride  4 mg Oral QAC breakfast  . insulin aspart  0-15 Units Subcutaneous TID WC  . losartan  100 mg Oral Daily  . simvastatin  40 mg Oral q1800  . sodium chloride flush  3 mL Intravenous Q12H  . sodium chloride flush  3 mL Intravenous Q12H  . ticagrelor  90 mg Oral BID   Continuous Infusions: . sodium chloride    . sodium chloride    . nitroGLYCERIN Stopped (10/31/17 1508)   PRN Meds: sodium chloride, sodium chloride, acetaminophen, morphine injection, nitroGLYCERIN, ondansetron (ZOFRAN) IV, sodium chloride flush, sodium chloride flush   Vital Signs    Vitals:   10/31/17 2300 11/01/17 0000 11/01/17 0312 11/01/17 0736  BP: (!) 119/53 (!) 116/49 123/77 128/63  Pulse: 73 71 77 73  Resp: 16 16 16 17   Temp:   98.5 F (36.9 C) 99.1 F (37.3 C)  TempSrc:   Oral Oral  SpO2: 91% 91% 100% 90%  Weight:   212 lb 8.4 oz (96.4 kg)   Height:        Intake/Output Summary (Last 24 hours) at 11/01/2017 0843 Last data filed at 11/01/2017 0528 Gross per 24 hour  Intake 636 ml  Output 1425 ml  Net -789 ml   Filed Weights   10/30/17 1738 10/31/17 0422 11/01/17 0312  Weight: 220 lb 0.3 oz (99.8 kg) 213 lb 10 oz (96.9 kg) 212 lb 8.4 oz (96.4 kg)    Telemetry    NSR, PVC - Personally Reviewed  ECG    NSR, PRWP, lateral ST changes - Personally Reviewed  Physical Exam   GEN: No acute distress.   Neck: No JVD Cardiac: RRR, no murmurs, rubs, or gallops. ; 2+ right DP pulse Respiratory: Clear to auscultation bilaterally. GI: Soft, nontender, non-distended  MS: No edema; No deformity. Right groin bruised above  the inguinal crease, soft Neuro:  Nonfocal  Psych: Normal affect   Labs    Chemistry Recent Labs  Lab 10/30/17 1102 10/31/17 0302 11/01/17 0303  NA 135 137 138  K 4.2 3.7 3.8  CL 103 105 104  CO2 22 20* 24  GLUCOSE 352* 276* 244*  BUN 12 9 10   CREATININE 0.87 0.79 0.91  CALCIUM 9.4 8.7* 9.0  PROT  --  6.1*  --   ALBUMIN  --  3.4*  --   AST  --  22  --   ALT  --  17  --   ALKPHOS  --  52  --   BILITOT  --  1.0  --   GFRNONAA >60 >60 >60  GFRAA >60 >60 >60  ANIONGAP 10 12 10      Hematology Recent Labs  Lab 10/30/17 1102 10/31/17 0302 11/01/17 0303  WBC 8.8 7.7 9.8  RBC 4.51 4.29 4.49  HGB 14.0 12.9* 13.6  HCT 39.5 37.7* 40.0  MCV 87.6 87.9 89.1  MCH 31.0 30.1 30.3  MCHC 35.4 34.2 34.0  RDW 13.6 13.6 13.8  PLT 121* 104* 129*    Cardiac Enzymes Recent Labs  Lab  10/31/17 1106  TROPONINI 0.30*    Recent Labs  Lab 10/30/17 1117  TROPIPOC 0.26*     BNPNo results for input(s): BNP, PROBNP in the last 168 hours.   DDimer No results for input(s): DDIMER in the last 168 hours.   Radiology    Dg Chest 2 View  Result Date: 10/30/2017 CLINICAL DATA:  Left chest pain, shortness of breath, fatigue, cough EXAM: CHEST  2 VIEW COMPARISON:  01/03/2015 FINDINGS: Mild bibasilar opacities, possibly atelectasis, although mild subpleural reticulation/fibrosis is favored. No focal consolidation. No pleural effusion or pneumothorax. The heart is top-normal in size. Postsurgical changes related to prior CABG. Degenerative changes of the visualized thoracolumbar spine. Median sternotomy. IMPRESSION: No evidence of acute cardiopulmonary disease. Electronically Signed   By: Julian Hy M.D.   On: 10/30/2017 11:28    Cardiac Studies   Cath results reviewed  Patient Profile     77 y.o. male s/p PCI of SVG to ramus  Assessment & Plan    1) Stressed importance of DAPT.  Continue secondary prevention with statins, beta blocker, ARB as well.  COntinue regular exercise.   Groin precautions given to minimize bleeding.    Plan d/c if he does well with cardiac rehab.     For questions or updates, please contact [place] Please consult [email or web address] for contact info under Cardiology/STEMI.      SignedLarae Grooms, MD  11/01/2017, 8:43 AM

## 2017-11-01 NOTE — Progress Notes (Signed)
Reviewed discharge instructions with pt and spouse including new medication Brillinta. Reviewed cath site care and s/s of infection and when to call MD. Reviewed follow up appts.  Pt taken in wheelchair up to 85M to visit with his brother who is a patient and nurse will call to have pt taken out so spouse can drive around to get him.

## 2017-11-02 ENCOUNTER — Other Ambulatory Visit: Payer: Self-pay | Admitting: Cardiology

## 2017-11-13 NOTE — Progress Notes (Signed)
Cardiology Office Note    Date:  11/14/2017   ID:  Troy [name], DOB [birth year], MRN [id number]  PCP:  Reynold Bowen, MD  Cardiologist: Carlyle Dolly, MD    Chief Complaint  Patient presents with  . Hospitalization Follow-up    s/p NSTEMI    History of Present Illness:    Troy [name] is a 77 y.o. male with past medical history of CAD (s/p CABG x5 in 1996 with LIMA-LAD, SVG-OM-LCx, SVG-RI, and SVG-PDA, known occlusion of SVG-OM-LCx by cath in 2016), ischemic cardiomyopathy (EF 35% in 2012, improved to 50-55% by echo in 12/2014), HTN, HLD, Type 2 DM, and PAD who presents to the office today for hospital follow-up.   He was recently admitted to [facility] on 10/30/2017 for new onset substernal chest tightness with associated dyspnea while at water aerobics. His initial troponin was found to be elevated to 0.26 (peaked at 0.30 during admission), therefore he was started on IV Heparin and IV nitroglycerin and admitted for further ischemic evaluation. A cardiac catheterization was performed the following morning which showed patent LIMA to LAD, patent SVG to PDA, and 99% distal graft stenosis of the SVG to ramus. He underwent PCI with aspiration thrombectomy and placement of a Synergy DES. He was started on dual antiplatelet therapy with aspirin and Brilinta and discharged on 11/01/2017.  In talking with the patient today, he reports overall doing well since his recent hospitalization. Reports his breathing has been better than it has been in years. He denies any recurrent chest pain. No recent dyspnea on exertion, orthopnea, PND, or lower extremity edema. Plans to resume water aerobics tomorrow.  He reports good compliance with his medication regimen including Aspirin and Brilinta and denies missing any recent doses.   He has been following blood pressure at home and reports his numbers have been well controlled in the 120's -130's/70's - 80's.    Past Medical History:  Diagnosis Date   . CAD (coronary artery disease)    a. 1996 s/p CABG x 5 (LIMA->LAD, VG->OM->dLCX, VG->RI, VG->RPDA);  b.  Nuclear 2/11:  EF 34%,  large anterior and apical scar. There is mild peri-infarct ischemia;  c.  LHC 4/16:  LAD occld, LCx mid 70%, OM branch occl, RCA 80% prox, occl mid, L-LAD ok, S-OM/dCFX 100%, S-RI ok, S-RPDA ok, EF 35-40% >> med Rx d. 10/2017: patent LIMA-LAD and SVG-PDA with 99% stenosis of SVG-RI - s/p DES placement  . Diabetes mellitus   . GERD (gastroesophageal reflux disease)   . Hard of hearing    has hearing aids /BIL  . HLD (hyperlipidemia)   . Hypertension   . Ischemic cardiomyopathy    a. eval by EP in 2011 - no ICD needed at that time;  b.  Echo 4/16: inf and inf-lat HK, EF 50-55%, Gr 1 DD;  c. LHC 4/16: EF 35-40%; d. 01/2014 Cardiac MRI: EF 42%, 2/3rd thickness mid and distal ant/septal/apical scar.  . Myocardial infarction ([place]) 1996  . PAD (peripheral artery disease) (H[facility])    Dr. Oneida Alar in the past  . Thrombocytopenia [facility])     Past Surgical History:  Procedure Laterality Date  . CATARACT EXTRACTION W/PHACO Right 08/06/2013   Procedure: CATARACT EXTRACTION PHACO AND INTRAOCULAR LENS PLACEMENT RIGHT EYE;  Surgeon: Tonny Branch, MD;  Location: [facility];  Service: Ophthalmology;  Laterality: Right;  CDE:  15.48  . COLONOSCOPY    . COLONOSCOPY WITH PROPOFOL N/A 10/22/2013   Procedure: COLONOSCOPY WITH PROPOFOL;  Surgeon: Irene Shipper, MD;  Location: [facility];  Service: Endoscopy;  Laterality: N/A;  . CORONARY ARTERY BYPASS GRAFT  1996   5 vessels  . CORONARY STENT INTERVENTION N/A 10/31/2017   Procedure: CORONARY STENT INTERVENTION;  Surgeon: Jettie Booze, MD;  Location: [place] [facility];  Service: Cardiovascular;  Laterality: N/A;  . HERNIA REPAIR     groin-25 yrs ago  . LEFT HEART CATH AND CORS/GRAFTS ANGIOGRAPHY N/A 10/31/2017   Procedure: LEFT HEART CATH AND CORS/GRAFTS ANGIOGRAPHY;  Surgeon: Jettie Booze, MD;  Location: [place] CV LAB;   Service: Cardiovascular;  Laterality: N/A;  . LEFT HEART CATHETERIZATION WITH CORONARY/GRAFT ANGIOGRAM N/A 01/06/2015   Procedure: LEFT HEART CATHETERIZATION WITH Beatrix Fetters;  Surgeon: Troy Sine, MD;  Location: [facility] CATH LAB;  Service: Cardiovascular;  Laterality: N/A;    Current Medications: Outpatient Medications Prior to Visit  Medication Sig Dispense Refill  . amLODipine (NORVASC) 10 MG tablet Take 1 tablet (10 mg total) by mouth daily. 90 tablet 1  . Ascorbic Acid (VITAMIN C) 1000 MG tablet Take 1,000 mg by mouth daily.      Marland Kitchen aspirin 81 MG EC tablet Take 81 mg by mouth daily.      . Calcium Carbonate-Vitamin D (OYSTER SHELL CALCIUM 500 + D PO) Take 1 tablet by mouth daily.      . carvedilol (COREG) 6.25 MG tablet TAKE 1 TABLET BY MOUTH TWICE DAILY WITH MEALS 180 tablet 1  . fish oil-omega-3 fatty acids 1000 MG capsule Take 2 g by mouth daily.      Marland Kitchen glimepiride (AMARYL) 2 MG tablet Take 4 mg by mouth daily before breakfast.     . isosorbide mononitrate (IMDUR) 30 MG 24 hr tablet TAKE ONE TABLET BY MOUTH ONCE DAILY (Patient taking differently: 30mg  by mouth once daily) 90 tablet 1  . losartan (COZAAR) 100 MG tablet Take 1 tablet (100 mg total) by mouth daily. 90 tablet 1  . nitroGLYCERIN (NITROLINGUAL) 0.4 MG/SPRAY spray Place 1 spray under the tongue every 5 (five) minutes x 3 doses as needed for chest pain. 12 g 12  . Saxagliptin-Metformin (KOMBIGLYZE XR) 01-999 MG TB24 Take 1 tablet by mouth daily.     . ticagrelor (BRILINTA) 90 MG TABS tablet Take 1 tablet (90 mg total) by mouth 2 (two) times daily. 180 tablet 2  . zolpidem (AMBIEN) 10 MG tablet Take 1 tablet by mouth at bedtime.     . simvastatin (ZOCOR) 40 MG tablet TAKE 1 TABLET BY MOUTH ONCE DAILY 90 tablet 1   No facility-administered medications prior to visit.      Allergies:   Lipitor [atorvastatin calcium]   Social History   Socioeconomic History  . Marital status: Married    Spouse name: None  .  Number of children: 4  . Years of education: None  . Highest education level: None  Social Needs  . Financial resource strain: None  . Food insecurity - worry: None  . Food insecurity - inability: None  . Transportation needs - medical: None  . Transportation needs - non-medical: None  Occupational History  . Occupation: retired Magazine features editor: RETIRED  Tobacco Use  . Smoking status: Former Smoker    Packs/day: 1.00    Years: 30.00    Pack years: 30.00    Types: Cigarettes    Start date: 10/03/1957    Last attempt to quit: 09/27/1990    Years since quitting: 27.1  .  Smokeless tobacco: Never Used  Substance and Sexual Activity  . Alcohol use: No    Alcohol/week: 0.0 oz  . Drug use: No  . Sexual activity: Not Currently  Other Topics Concern  . None  Social History Narrative   Lives in [place] with wife.  Does water aerobics almost daily.     Family History:  The patient's family history includes Cancer in his brother; Diabetes in his brother and sister; Heart attack in his brother, father, mother, and sister; Heart disease in his father and mother; Hypertension in his brother, father, mother, and sister.   Review of Systems:   Please see the history of present illness.     General:  No chills, fever, night sweats or weight changes.  Cardiovascular:  No chest pain, edema, orthopnea, palpitations, paroxysmal nocturnal dyspnea. Positive for dyspnea on exertion (improving).  Dermatological: No rash, lesions/masses Respiratory: No cough, dyspnea Urologic: No hematuria, dysuria Abdominal:   No nausea, vomiting, diarrhea, bright red blood per rectum, melena, or hematemesis Neurologic:  No visual changes, wkns, changes in mental status. All other systems reviewed and are otherwise negative except as noted above.   Physical Exam:    VS:  BP (!) 144/66   Pulse 67   Ht 5\' 9"  (1.753 m)   Wt 215 lb (97.5 kg)   SpO2 95%   BMI 31.75 kg/m    General: Well developed,  well nourished Caucasian male appearing in no acute distress. Head: Normocephalic, atraumatic, sclera non-icteric, no xanthomas, nares are without discharge.  Neck: No carotid bruits. JVD not elevated.  Lungs: Respirations regular and unlabored, without wheezes or rales.  Heart: Regular rate and rhythm. No S3 or S4.  No murmur, no rubs, or gallops appreciated. Abdomen: Soft, non-tender, non-distended with normoactive bowel sounds. No hepatomegaly. No rebound/guarding. No obvious abdominal masses. Msk:  Strength and tone appear normal for age. No joint deformities or effusions. Extremities: No clubbing or cyanosis. No lower extremity edema.  Distal pedal pulses are 2+ bilaterally. Neuro: Alert and oriented X 3. Moves all extremities spontaneously. No focal deficits noted. Psych:  Responds to questions appropriately with a normal affect. Skin: No rashes or lesions noted  Wt Readings from Last 3 Encounters:  11/14/17 215 lb (97.5 kg)  11/01/17 212 lb 8.4 oz (96.4 kg)  10/08/16 220 lb (99.8 kg)     Studies/Labs Reviewed:   EKG:  EKG is ordered today.  The ekg ordered today demonstrates NSR, HR 66, with anterolateral TWI.   Recent Labs: 10/31/2017: ALT 17 11/01/2017: BUN 10; Creatinine, Ser 0.91; Hemoglobin 13.6; Platelets 129; Potassium 3.8; Sodium 138   Lipid Panel    Component Value Date/Time   CHOL 109 10/31/2017 0302   TRIG 115 10/31/2017 0302   HDL 32 (L) 10/31/2017 0302   CHOLHDL 3.4 10/31/2017 0302   VLDL 23 10/31/2017 0302   LDLCALC 54 10/31/2017 0302    Additional studies/ records that were reviewed today include:   Cardiac Catheterization: 10/31/2017  Ost Cx to Mid Cx lesion is 75% stenosed and appears unchanged from prior. THe SVG to circ is occluded  Prox LAD lesion is 100% stenosed. LIMA to LAD is patent.  Mid RCA lesion is 100% stenosed. SVG to PDA is patent.  Ramus-1 lesion is 100% stenosed. SVG to ramus is patent proximally. Dist Graft lesion is 99% stenosed.  A  drug-eluting stent was successfully placed using a STENT SYNERGY DES 3X24, after aspiration thrombectomy.  Post intervention, there is a 0% residual  stenosis.  Ramus-2 lesion is 80% stenosed. Balloon angioplasty was performed using a BALLOON SAPPHIRE 2.0X15.  Post intervention, there is a 10% residual stenosis.  LV end diastolic pressure is mildly elevated.  There is no aortic valve stenosis.   Continue IV angiomax for 1 hour post procedure.  Continue aspirin and Brilinta for 1 year along with aggressive secondary prevention.   Assessment:    1. Coronary artery disease involving native coronary artery of native heart with angina pectoris ([place])   2. History of cardiomyopathy   3. Essential hypertension   4. Hyperlipidemia LDL goal <70      Plan:   In order of problems listed above:  1. CAD - s/p CABG x5 in 1996 with LIMA-LAD, SVG-OM-LCx, SVG-RI, and SVG-PDA and known occlusion of SVG-OM-LCx by cath in 2016. Recently admitted for an NSTEMI with peak troponin at 0.30. Cardiac catheterization showed patent LIMA to LAD, patent SVG to PDA, and 99% distal graft stenosis of the SVG to ramus. He underwent PCI with aspiration thrombectomy and placement of a Synergy DES. - he reports significant improvement in his dyspnea and fatigue since recent stent placement. No recurrent symptoms.  - continue ASA, Brilinta, statin, BB, and Imdur. Will switch statin to Crestor as outlined below.   2. History of Ischemic cardiomyopathy - EF reduced to 35% in 2012, improved to 50-55% by echo in 12/2014. - he denies any recent orthopnea, PND, or lower extremity edema. Does not appear volume overloaded by physical examination.  - continue BB and ARB.   3. HTN - BP is slightly elevated at 144/66 when checked in clinic today. Reports this has been well-controlled when checked at home.  - continue Amlodipine 10mg  daily, Coreg 6.25mg  BID, Imdur 30mg  daily, and Losartan 100mg  daily.   4. HLD - Recent  fasting lipid panel on 10/31/2017 showed total cholesterol of 109, triglycerides 115, HDL 32, and LDL 54.  At goal of LDL less than 70. - He is currently on Simvastatin 40 mg daily in the setting of also being on Amlodipine 10 mg daily. Was intolerant to Atorvastatin 80 mg daily in the past but no other known intolerances. Will stop Simvastatin and switch to Crestor 20 mg daily.  Medication Adjustments/Labs and Tests Ordered: Current medicines are reviewed at length with the patient today.  Concerns regarding medicines are outlined above.  Medication changes, Labs and Tests ordered today are listed in the Patient Instructions below. Patient Instructions  Medication Instructions:  STOP SIMVASTATIN  START CRESTOR 20 MG DAILY    Labwork: NONE  Testing/Procedures: NONE  Follow-Up: Your physician recommends that you schedule a follow-up appointment in: 3-4 MONTHS WITH DR. BRANCH    Any Other Special Instructions Will Be Listed Below (If Applicable).     If you need a refill on your cardiac medications before your next appointment, please call your pharmacy.      Signed, Erma Heritage, PA-C  11/14/2017 7:26 PM    [facility] [address] [place], [place] [address] Phone: [phone]

## 2017-11-14 ENCOUNTER — Ambulatory Visit (INDEPENDENT_AMBULATORY_CARE_PROVIDER_SITE_OTHER): Payer: Medicare Other | Admitting: Student

## 2017-11-14 ENCOUNTER — Encounter: Payer: Self-pay | Admitting: Student

## 2017-11-14 VITALS — BP 144/66 | HR 67 | Ht 69.0 in | Wt 215.0 lb

## 2017-11-14 DIAGNOSIS — I25119 Atherosclerotic heart disease of native coronary artery with unspecified angina pectoris: Secondary | ICD-10-CM | POA: Diagnosis not present

## 2017-11-14 DIAGNOSIS — I10 Essential (primary) hypertension: Secondary | ICD-10-CM | POA: Diagnosis not present

## 2017-11-14 DIAGNOSIS — Z8679 Personal history of other diseases of the circulatory system: Secondary | ICD-10-CM | POA: Diagnosis not present

## 2017-11-14 DIAGNOSIS — E785 Hyperlipidemia, unspecified: Secondary | ICD-10-CM | POA: Diagnosis not present

## 2017-11-14 MED ORDER — ROSUVASTATIN CALCIUM 20 MG PO TABS: 20.0000 mg | ORAL_TABLET | Freq: Every day | ORAL | 1 refills | Status: DC

## 2017-11-14 NOTE — Patient Instructions (Signed)
Medication Instructions:  STOP SIMVASTATIN  START CRESTOR 20 MG DAILY    Labwork: NONE  Testing/Procedures: NONE  Follow-Up: Your physician recommends that you schedule a follow-up appointment in: 3-4 MONTHS WITH DR. BRANCH    Any Other Special Instructions Will Be Listed Below (If Applicable).     If you need a refill on your cardiac medications before your next appointment, please call your pharmacy.

## 2017-11-21 ENCOUNTER — Telehealth: Payer: Self-pay | Admitting: Student

## 2017-11-21 NOTE — Telephone Encounter (Signed)
rosuvastatin (CRESTOR) 20 MG tablet   Cannot urinate good since starting medication

## 2017-11-21 NOTE — Telephone Encounter (Signed)
   I have not heard of that as a side-effect to the medication but if he thinks his symptoms are new since starting the medication, he can try cutting the tablet in half and trying 10mg  daily.   Signed, Erma Heritage, PA-C 11/21/2017, 4:40 PM

## 2017-11-21 NOTE — Telephone Encounter (Signed)
I spoke with wife, pt will decrease dose to 10 mg daily

## 2017-12-01 ENCOUNTER — Other Ambulatory Visit: Payer: Self-pay | Admitting: Cardiology

## 2018-01-23 ENCOUNTER — Other Ambulatory Visit: Payer: Self-pay | Admitting: Student

## 2018-01-25 ENCOUNTER — Other Ambulatory Visit: Payer: Self-pay

## 2018-01-25 ENCOUNTER — Encounter: Payer: Self-pay | Admitting: Cardiology

## 2018-01-25 ENCOUNTER — Telehealth: Payer: Self-pay | Admitting: Cardiology

## 2018-01-25 ENCOUNTER — Ambulatory Visit: Payer: Medicare Other | Admitting: Cardiology

## 2018-01-25 VITALS — BP 149/68 | HR 60 | Ht 69.0 in | Wt 207.0 lb

## 2018-01-25 DIAGNOSIS — I10 Essential (primary) hypertension: Secondary | ICD-10-CM

## 2018-01-25 DIAGNOSIS — I2510 Atherosclerotic heart disease of native coronary artery without angina pectoris: Secondary | ICD-10-CM | POA: Diagnosis not present

## 2018-01-25 DIAGNOSIS — E782 Mixed hyperlipidemia: Secondary | ICD-10-CM

## 2018-01-25 MED ORDER — ROSUVASTATIN CALCIUM 10 MG PO TABS: 10.0000 mg | ORAL_TABLET | Freq: Every day | ORAL | 1 refills | Status: DC

## 2018-01-25 NOTE — Telephone Encounter (Signed)
Echo scheduled in [facility]   Feb 09, 2018

## 2018-01-25 NOTE — Progress Notes (Signed)
Clinical Summary [name] is a 77 y.o.male seen today for follow up of the following medical problems.   1. CAD - hx of CABG in 1996 - cath 12/2014 showed patent LM, LAD occluded, LCX 70% mid, RCA 80% prox. LIMA-LAD patent SVG-OM occluded, SVG-ramus patent. LVEF 35-40%. Managed medically, with recs if symptoms on medical therapy can consider LCX intervetntion - 12/2014 echo LVEF 66-06%, grade I diastolic dysfunction   - admit 10/2017 with NTEMI, peak trop 0.3. Cath as reported below, receied DES to SVG-ramus  - no recent chest pain, no SOB - compliant with meds.    2. HTN  - home bp's 120s/50s.   3. Hyperlipidemia - did not tolerate lipitor, had been on simvastatin but later changed to crestor - compliant with statin     Past Medical History:  Diagnosis Date  . CAD (coronary artery disease)    a. 1996 s/p CABG x 5 (LIMA->LAD, VG->OM->dLCX, VG->RI, VG->RPDA);  b.  Nuclear 2/11:  EF 34%,  large anterior and apical scar. There is mild peri-infarct ischemia;  c.  LHC 4/16:  LAD occld, LCx mid 70%, OM branch occl, RCA 80% prox, occl mid, L-LAD ok, S-OM/dCFX 100%, S-RI ok, S-RPDA ok, EF 35-40% >> med Rx d. 10/2017: patent LIMA-LAD and SVG-PDA with 99% stenosis of SVG-RI - s/p DES placement  . Diabetes mellitus   . GERD (gastroesophageal reflux disease)   . Hard of hearing    has hearing aids /BIL  . HLD (hyperlipidemia)   . Hypertension   . Ischemic cardiomyopathy    a. eval by EP in 2011 - no ICD needed at that time;  b.  Echo 4/16: inf and inf-lat HK, EF 50-55%, Gr 1 DD;  c. LHC 4/16: EF 35-40%; d. 01/2014 Cardiac MRI: EF 42%, 2/3rd thickness mid and distal ant/septal/apical scar.  . Myocardial infarction ([place]) 1996  . PAD (peripheral artery disease) ([facility])    Dr. Oneida Alar in the past  . Thrombocytopenia ([place])      Allergies  Allergen Reactions  . Lipitor [Atorvastatin Calcium]     Myalgias      Current Outpatient Medications  Medication Sig Dispense Refill    . amLODipine (NORVASC) 10 MG tablet Take 1 tablet (10 mg total) by mouth daily. 90 tablet 1  . Ascorbic Acid (VITAMIN C) 1000 MG tablet Take 1,000 mg by mouth daily.      Marland Kitchen aspirin 81 MG EC tablet Take 81 mg by mouth daily.      . Calcium Carbonate-Vitamin D (OYSTER SHELL CALCIUM 500 + D PO) Take 1 tablet by mouth daily.      . carvedilol (COREG) 6.25 MG tablet TAKE 1 TABLET BY MOUTH TWICE DAILY WITH MEALS 180 tablet 1  . fish oil-omega-3 fatty acids 1000 MG capsule Take 2 g by mouth daily.      Marland Kitchen glimepiride (AMARYL) 2 MG tablet Take 4 mg by mouth daily before breakfast.     . isosorbide mononitrate (IMDUR) 30 MG 24 hr tablet TAKE 1 TABLET BY MOUTH ONCE DAILY 90 tablet 3  . losartan (COZAAR) 100 MG tablet Take 1 tablet (100 mg total) by mouth daily. 90 tablet 1  . nitroGLYCERIN (NITROLINGUAL) 0.4 MG/SPRAY spray Place 1 spray under the tongue every 5 (five) minutes x 3 doses as needed for chest pain. 12 g 12  . rosuvastatin (CRESTOR) 20 MG tablet TAKE 1 TABLET BY MOUTH ONCE DAILY 30 tablet 1  . Saxagliptin-Metformin (KOMBIGLYZE XR) 01-999  MG TB24 Take 1 tablet by mouth daily.     . ticagrelor (BRILINTA) 90 MG TABS tablet Take 1 tablet (90 mg total) by mouth 2 (two) times daily. 180 tablet 2  . zolpidem (AMBIEN) 10 MG tablet Take 1 tablet by mouth at bedtime.      No current facility-administered medications for this visit.      Past Surgical History:  Procedure Laterality Date  . CATARACT EXTRACTION W/PHACO Right 08/06/2013   Procedure: CATARACT EXTRACTION PHACO AND INTRAOCULAR LENS PLACEMENT RIGHT EYE;  Surgeon: Tonny Branch, MD;  Location: [facility];  Service: Ophthalmology;  Laterality: Right;  CDE:  15.48  . COLONOSCOPY    . COLONOSCOPY WITH PROPOFOL N/A 10/22/2013   Procedure: COLONOSCOPY WITH PROPOFOL;  Surgeon: Irene Shipper, MD;  Location: [facility];  Service: Endoscopy;  Laterality: N/A;  . CORONARY ARTERY BYPASS GRAFT  1996   5 vessels  . CORONARY STENT INTERVENTION N/A 10/31/2017    Procedure: CORONARY STENT INTERVENTION;  Surgeon: Jettie Booze, MD;  Location: [facility];  Service: Cardiovascular;  Laterality: N/A;  . HERNIA REPAIR     groin-25 yrs ago  . LEFT HEART CATH AND CORS/GRAFTS ANGIOGRAPHY N/A 10/31/2017   Procedure: LEFT HEART CATH AND CORS/GRAFTS ANGIOGRAPHY;  Surgeon: Jettie Booze, MD;  Location: [facility];  Service: Cardiovascular;  Laterality: N/A;  . LEFT HEART CATHETERIZATION WITH CORONARY/GRAFT ANGIOGRAM N/A 01/06/2015   Procedure: LEFT HEART CATHETERIZATION WITH Beatrix Fetters;  Surgeon: Troy Sine, MD;  Location: [facility];  Service: Cardiovascular;  Laterality: N/A;     Allergies  Allergen Reactions  . Lipitor [Atorvastatin Calcium]     Myalgias       Family History  Problem Relation Age of Onset  . Heart disease Mother   . Heart attack Mother   . Hypertension Mother   . Heart disease Father   . Heart attack Father   . Hypertension Father   . Cancer Brother   . Heart attack Sister   . Heart attack Brother   . Diabetes Brother   . Diabetes Sister   . Hypertension Sister   . Hypertension Brother   . Stroke Neg Hx      Social History Mr. Mcclenathan reports that he quit smoking about 27 years ago. His smoking use included cigarettes. He started smoking about 60 years ago. He has a 30.00 pack-year smoking history. He has never used smokeless tobacco. Mr. Hellberg reports that he does not drink alcohol.   Review of Systems CONSTITUTIONAL: No weight loss, fever, chills, weakness or fatigue.  HEENT: Eyes: No visual loss, blurred vision, double vision or yellow sclerae.No hearing loss, sneezing, congestion, runny nose or sore throat.  SKIN: No rash or itching.  CARDIOVASCULAR: per hpi RESPIRATORY: No shortness of breath, cough or sputum.  GASTROINTESTINAL: No anorexia, nausea, vomiting or diarrhea. No abdominal pain or blood.  GENITOURINARY: No burning on urination, no polyuria NEUROLOGICAL:  No headache, dizziness, syncope, paralysis, ataxia, numbness or tingling in the extremities. No change in bowel or bladder control.  MUSCULOSKELETAL: No muscle, back pain, joint pain or stiffness.  LYMPHATICS: No enlarged nodes. No history of splenectomy.  PSYCHIATRIC: No history of depression or anxiety.  ENDOCRINOLOGIC: No reports of sweating, cold or heat intolerance. No polyuria or polydipsia.  Marland Kitchen   Physical Examination Vitals:   01/25/18 1345  BP: (!) 149/68  Pulse: 60  SpO2: 98%   Vitals:   01/25/18 1345  Weight: 207  lb (93.9 kg)  Height: 5\' 9"  (1.753 m)    Gen: resting comfortably, no acute distress HEENT: no scleral icterus, pupils equal round and reactive, no palptable cervical adenopathy,  CV: RRR, 2/6 systolic murmur rusb, no jvd Resp: Clear to auscultation bilaterally GI: abdomen is soft, non-tender, non-distended, normal bowel sounds, no hepatosplenomegaly MSK: extremities are warm, no edema.  Skin: warm, no rash Neuro:  no focal deficits Psych: appropriate affect   Diagnostic Studies 12/2014 Cath  HEMODYNAMICS:  Central Aorta: 170/59  Left Ventricle: 170/19  ANGIOGRAPHY:  Left main: Normal vessel which bifurcated into the LAD and left circumflex coronary artery  LAD: Moderate size proximal vessel that was totally occluded after the first septal and diagonal vessel.  Left circumflex: good size vessel that had 70% mid stenosis before a small marginal branch. The distal vessel was small caliber and there was retrograde filling of a previous sequential graft most likely between the small marginal and distal circumflex.  Right coronary artery: Moderate size vessel with calcification and 80% proximal stenosis with a small area of probable thrombus in its midsegment prior to total mid occlusion.  LIMA to LAD: Widely patent and anastomosed into the mid LAD. There is retrograde filling to the proximal occlusion. The anastomosis was free of significant  disease extended to the apex.  SVG sequential graft supplying a marginal and distal circumflex was totally occluded at its origin.  SVG E high marginal or ramus intermediate vessel was widely patent. There was mild 20% proximal smooth narrowing.   Left ventriculography revealed global LV dysfunction with an ejection fraction of 35 to less than 40%.   IMPRESSION:  Ischemic cardiomyopathy with an ejection fraction of 35 to less than 40% with diffuse global hypocontractility.  Significant native coronary obstructive disease with total occlusion of the proximal LAD after the septal diagonal vessel; 70% stenosis in the proximal AV groove circumflex with occlusion of the native OM branch and retrograde filling of the sequential limb distally to this mid branch and total occlusion of the mid right coronary artery with 80% calcified proximal stenosis and small area of thrombus in the mid RCA prior to the total occlusion.  Occluded vein graft which most likely was the sequential graft supplying an OM and distal circumflex vessel the circumflex coronary artery.  Patent SVG which seems to supply a high marginal or ramus intermediate-like vessel this 20% proximal narrowing in the graft.  Patent SVG to the PDA branch of the RCA.  Patent LIMA graft to the mid LAD.  POST CATH RECOMMENDATION:  The patient has an occluded graft which most likely sequentially supplied a small OM and distal circumflex vessel. 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 Platelets today are low at 81,000. He has not been on any significant antianginal regimen. Initial medical therapy will be recommended with close follow-up of his platelets. If recurrent symptomatology develops consider possible PCI to the native circumflex   10/2017 cath  Ost Cx to Mid Cx lesion is 75% stenosed and appears unchanged from prior. THe SVG to circ is occluded  Prox LAD lesion is 100% stenosed. LIMA to LAD is  patent.  Mid RCA lesion is 100% stenosed. SVG to PDA is patent.  Ramus-1 lesion is 100% stenosed. SVG to ramus is patent proximally. Dist Graft lesion is 99% stenosed.  A drug-eluting stent was successfully placed using a STENT SYNERGY DES 3X24, after aspiration thrombectomy.  Post intervention, there is a 0% residual stenosis.  Ramus-2 lesion is  80% stenosed. Balloon angioplasty was performed using a BALLOON SAPPHIRE 2.0X15.  Post intervention, there is a 10% residual stenosis.  LV end diastolic pressure is mildly elevated.  There is no aortic valve stenosis.   Continue IV angiomax for 1 hour post procedure.  Continue aspirin and Brilinta for 1 year along with aggressive secondary prevention.     Assessment and Plan  1. CAD - no current symptoms, continue current meds incliuding DAPT at least until 10/2018 - obtain echo given his recent MI  2. HTN -has some white coat HTN, home numbers are at goal - remains at goal based on home numbers, continue current meds  3. Hyperlipidemia -did not tolerate high dose statin - some side effects on crestor 20, try lowering to 10mg  daily.     F/u  6 months       [name], M.D.

## 2018-01-25 NOTE — Patient Instructions (Signed)
Your physician wants you to follow-up in: 6 MONTHS WITH DR. BRANCH You will receive a reminder letter in the mail two months in advance. If you don't receive a letter, please call our office to schedule the follow-up appointment.  Your physician recommends that you continue on your current medications as directed. Please refer to the Current Medication list given to you today.  Your physician has requested that you have an echocardiogram. Echocardiography is a painless test that uses sound waves to create images of your heart. It provides your doctor with information about the size and shape of your heart and how well your heart's chambers and valves are working. This procedure takes approximately one hour. There are no restrictions for this procedure.  Thank you for choosing [facility]!!   

## 2018-01-30 ENCOUNTER — Encounter: Payer: Self-pay | Admitting: Cardiology

## 2018-02-09 ENCOUNTER — Ambulatory Visit (INDEPENDENT_AMBULATORY_CARE_PROVIDER_SITE_OTHER): Payer: Medicare Other

## 2018-02-09 ENCOUNTER — Other Ambulatory Visit: Payer: Self-pay

## 2018-02-09 DIAGNOSIS — I2510 Atherosclerotic heart disease of native coronary artery without angina pectoris: Secondary | ICD-10-CM

## 2018-02-13 ENCOUNTER — Telehealth: Payer: Self-pay | Admitting: *Deleted

## 2018-02-13 NOTE — Telephone Encounter (Signed)
Pt aware - routed to pcp  

## 2018-02-13 NOTE — Telephone Encounter (Signed)
-----   Message from [name], MD sent at 02/10/2018 11:46 AM EDT ----- Echo shows mild decrease in heart pumping function, the overall pumping function is mildly decreased. Hopefully over time with his stent and on the medications this will improve  Zandra Abts MD

## 2018-02-15 ENCOUNTER — Other Ambulatory Visit: Payer: Self-pay | Admitting: *Deleted

## 2018-02-15 MED ORDER — LOSARTAN POTASSIUM 100 MG PO TABS: 100.0000 mg | ORAL_TABLET | Freq: Every day | ORAL | 3 refills | Status: DC

## 2018-03-28 ENCOUNTER — Other Ambulatory Visit: Payer: Self-pay | Admitting: Cardiology

## 2018-05-01 ENCOUNTER — Other Ambulatory Visit: Payer: Self-pay | Admitting: Cardiology

## 2018-05-22 ENCOUNTER — Other Ambulatory Visit: Payer: Self-pay | Admitting: Cardiology

## 2018-06-13 ENCOUNTER — Other Ambulatory Visit: Payer: Self-pay | Admitting: Cardiology

## 2018-07-31 ENCOUNTER — Encounter: Payer: Self-pay | Admitting: Cardiology

## 2018-07-31 ENCOUNTER — Telehealth: Payer: Self-pay | Admitting: *Deleted

## 2018-07-31 ENCOUNTER — Ambulatory Visit: Payer: Medicare Other | Admitting: Cardiology

## 2018-07-31 VITALS — BP 151/55 | HR 58 | Ht 69.0 in | Wt 199.2 lb

## 2018-07-31 DIAGNOSIS — I2510 Atherosclerotic heart disease of native coronary artery without angina pectoris: Secondary | ICD-10-CM

## 2018-07-31 DIAGNOSIS — I10 Essential (primary) hypertension: Secondary | ICD-10-CM

## 2018-07-31 DIAGNOSIS — E782 Mixed hyperlipidemia: Secondary | ICD-10-CM

## 2018-07-31 MED ORDER — CLOPIDOGREL BISULFATE 300 MG PO TABS: 300.0000 mg | ORAL_TABLET | Freq: Once | ORAL | 0 refills | Status: AC

## 2018-07-31 MED ORDER — CLOPIDOGREL BISULFATE 75 MG PO TABS: 75.0000 mg | ORAL_TABLET | Freq: Every day | ORAL | 6 refills | Status: DC

## 2018-07-31 NOTE — Telephone Encounter (Signed)
Per prior auth request from [facility] for plavix 300 mg, patient's insurance will not cover this tablet. Wife and patient advised to take (4) 75 mg tablets once, then (1) 75 mg tablet daily afterwards. Verbalized understanding.

## 2018-07-31 NOTE — Patient Instructions (Signed)
Medication Instructions:   Begin Plavix 300mg  daily x 1 dose.  Next, reduce Plavix to 75mg  daily thereafter.   May stop Plavix on 10/31/2018.  Continue all other medications.    Labwork: none  Testing/Procedures: none  Follow-Up: Your physician wants you to follow up in: 6 months.  You will receive a reminder letter in the mail one-two months in advance.  If you don't receive a letter, please call our office to schedule the follow up appointment   Any Other Special Instructions Will Be Listed Below (If Applicable).  If you need a refill on your cardiac medications before your next appointment, please call your pharmacy.

## 2018-07-31 NOTE — Progress Notes (Signed)
Clinical Summary Mr. Mcquerry is a 77 y.o.male seen today for follow up of the following medical problems.   1. CAD - hx of CABG in 1996 - cath 12/2014 showed patent LM, LAD occluded, LCX 70% mid, RCA 80% prox. LIMA-LAD patent SVG-OM occluded, SVG-ramus patent. LVEF 35-40%. Managed medically, with recs if symptoms on medical therapy can consider LCX intervetntion - 12/2014 echo LVEF 46-27%, grade I diastolic dysfunction   - admit 10/2017 with NTEMI, peak trop 0.3. Cath as reported below, receied DES to SVG-ramus  - 01/2018 ech LVEF 03-50%, grade I diastolic dysfunction apical hypokinesis.   - no recent chest pain. No SOB/DOE -. Ran out of brllinta, we were not made aware, since breathing has improved.     2. HTN - has had some white coat HTN in the past - home bp's 130s/60s  3. Hyperlipidemia - did not tolerate lipitor,had been onsimvastatin but later changed to crestor  04/2018 TC 96 TG 88 HDL 31 LDL 47 - compliant statin   Just bought a Survey[employer], [place] near [employer]. Has pontoon boat, enjoys fishing.   Past Medical History:  Diagnosis Date  . CAD (coronary artery disease)    a. 1996 s/p CABG x 5 (LIMA->LAD, VG->OM->dLCX, VG->RI, VG->RPDA);  b.  Nuclear 2/11:  EF 34%,  large anterior and apical scar. There is mild peri-infarct ischemia;  c.  LHC 4/16:  LAD occld, LCx mid 70%, OM [place] occl, RCA 80% prox, occl mid, L-LAD ok, S-OM/dCFX 100%, S-RI ok, S-RPDA ok, EF 35-40% >> med Rx d. 10/2017: patent LIMA-LAD and SVG-PDA with 99% stenosis of SVG-RI - s/p DES placement  . Diabetes mellitus   . GERD (gastroesophageal reflux disease)   . Hard of hearing    has hearing aids /BIL  . HLD (hyperlipidemia)   . Hypertension   . Ischemic cardiomyopathy    a. eval by EP in 2011 - no ICD needed at that time;  b.  Echo 4/16: inf and inf-lat HK, EF 50-55%, Gr 1 DD;  c. LHC 4/16: EF 35-40%; d. 01/2014 Cardiac MRI: EF 42%, 2/3rd thickness mid and distal ant/septal/apical scar.  .  Myocardial infarction ([place]) 1996  . PAD (peripheral artery disease) ([facility])    Dr. Oneida Alar in the past  . Thrombocytopenia ([place])      Allergies  Allergen Reactions  . Lipitor [Atorvastatin Calcium]     Myalgias      Current Outpatient Medications  Medication Sig Dispense Refill  . amLODipine (NORVASC) 10 MG tablet TAKE 1 TABLET BY MOUTH ONCE DAILY 90 tablet 1  . Ascorbic Acid (VITAMIN C) 1000 MG tablet Take 1,000 mg by mouth daily.      Marland Kitchen aspirin 81 MG EC tablet Take 81 mg by mouth daily.      Marland Kitchen BRILINTA 90 MG TABS tablet TAKE 1 TABLET BY MOUTH TWICE DAILY 180 tablet 1  . Calcium Carbonate-Vitamin D (OYSTER SHELL CALCIUM 500 + D PO) Take 1 tablet by mouth daily.      . carvedilol (COREG) 6.25 MG tablet TAKE 1 TABLET BY MOUTH TWICE DAILY WITH MEALS 180 tablet 1  . fish oil-omega-3 fatty acids 1000 MG capsule Take 2 g by mouth daily.      Marland Kitchen glimepiride (AMARYL) 2 MG tablet Take 4 mg by mouth daily before breakfast.     . isosorbide mononitrate (IMDUR) 30 MG 24 hr tablet TAKE 1 TABLET BY MOUTH ONCE DAILY 90 tablet 3  . losartan (COZAAR)  100 MG tablet Take 1 tablet (100 mg total) by mouth daily. 90 tablet 3  . nitroGLYCERIN (NITROLINGUAL) 0.4 MG/SPRAY spray Place 1 spray under the tongue every 5 (five) minutes x 3 doses as needed for chest pain. 12 g 12  . rosuvastatin (CRESTOR) 10 MG tablet TAKE 1 TABLET BY MOUTH ONCE DAILY 90 tablet 1  . Saxagliptin-Metformin (KOMBIGLYZE XR) 01-999 MG TB24 Take 1 tablet by mouth daily.     Marland Kitchen zolpidem (AMBIEN) 10 MG tablet Take 1 tablet by mouth at bedtime.      No current facility-administered medications for this visit.      Past Surgical History:  Procedure Laterality Date  . CATARACT EXTRACTION W/PHACO Right 08/06/2013   Procedure: CATARACT EXTRACTION PHACO AND INTRAOCULAR LENS PLACEMENT RIGHT EYE;  Surgeon: Tonny Jarmaine Ehrler, MD;  Location: [facility];  Service: Ophthalmology;  Laterality: Right;  CDE:  15.48  . COLONOSCOPY    . COLONOSCOPY WITH PROPOFOL  N/A 10/22/2013   Procedure: COLONOSCOPY WITH PROPOFOL;  Surgeon: Irene Shipper, MD;  Location: [facility];  Service: Endoscopy;  Laterality: N/A;  . CORONARY ARTERY BYPASS GRAFT  1996   5 vessels  . CORONARY STENT INTERVENTION N/A 10/31/2017   Procedure: CORONARY STENT INTERVENTION;  Surgeon: Jettie Booze, MD;  Location: [place] [facility];  Service: Cardiovascular;  Laterality: N/A;  . HERNIA REPAIR     groin-25 yrs ago  . LEFT HEART CATH AND CORS/GRAFTS ANGIOGRAPHY N/A 10/31/2017   Procedure: LEFT HEART CATH AND CORS/GRAFTS ANGIOGRAPHY;  Surgeon: Jettie Booze, MD;  Location: [place] [facility];  Service: Cardiovascular;  Laterality: N/A;  . LEFT HEART CATHETERIZATION WITH CORONARY/GRAFT ANGIOGRAM N/A 01/06/2015   Procedure: LEFT HEART CATHETERIZATION WITH Beatrix Fetters;  Surgeon: Troy Sine, MD;  Location: [facility] CATH LAB;  Service: Cardiovascular;  Laterality: N/A;     Allergies  Allergen Reactions  . Lipitor [Atorvastatin Calcium]     Myalgias       Family History  Problem Relation Age of Onset  . Heart disease Mother   . Heart attack Mother   . Hypertension Mother   . Heart disease Father   . Heart attack Father   . Hypertension Father   . Cancer Brother   . Heart attack Sister   . Heart attack Brother   . Diabetes Brother   . Diabetes Sister   . Hypertension Sister   . Hypertension Brother   . Stroke Neg Hx      Social History Mr. Rew reports that he quit smoking about 27 years ago. His smoking use included cigarettes. He started smoking about 60 years ago. He has a 30.00 pack-year smoking history. He has never used smokeless tobacco. Mr. Both reports that he does not drink alcohol.   Review of Systems CONSTITUTIONAL: No weight loss, fever, chills, weakness or fatigue.  HEENT: Eyes: No visual loss, blurred vision, double vision or yellow sclerae.No hearing loss, sneezing, congestion, runny nose or sore throat.  SKIN: No rash or  itching.  CARDIOVASCULAR: per hpi RESPIRATORY: No shortness of breath, cough or sputum.  GASTROINTESTINAL: No anorexia, nausea, vomiting or diarrhea. No abdominal pain or blood.  GENITOURINARY: No burning on urination, no polyuria NEUROLOGICAL: No headache, dizziness, syncope, paralysis, ataxia, numbness or tingling in the extremities. No change in bowel or bladder control.  MUSCULOSKELETAL: No muscle, back pain, joint pain or stiffness.  LYMPHATICS: No enlarged nodes. No history of splenectomy.  PSYCHIATRIC: No history of depression or anxiety.  ENDOCRINOLOGIC:  No reports of sweating, cold or heat intolerance. No polyuria or polydipsia.  Marland Kitchen   Physical Examination Vitals:   07/31/18 0843  BP: (!) 151/55  Pulse: (!) 58   Vitals:   07/31/18 0843  Weight: 199 lb 3.2 oz (90.4 kg)  Height: 5\' 9"  (1.753 m)    Gen: resting comfortably, no acute distress HEENT: no scleral icterus, pupils equal round and reactive, no palptable cervical adenopathy,  CV: RRR, no m/r/g, no jvd Resp: Clear to auscultation bilaterally GI: abdomen is soft, non-tender, non-distended, normal bowel sounds, no hepatosplenomegaly MSK: extremities are warm, no edema.  Skin: warm, no rash Neuro:  no focal deficits Psych: appropriate affect   Diagnostic Studies 12/2014 Cath  HEMODYNAMICS:  Central Aorta: 170/59  Left Ventricle: 170/19  ANGIOGRAPHY:  Left main: Normal vessel which bifurcated into the LAD and left circumflex coronary artery  LAD: Moderate size proximal vessel that was totally occluded after the first septal and diagonal vessel.  Left circumflex: good size vessel that had 70% mid stenosis before a small marginal [place]. The distal vessel was small caliber and there was retrograde filling of a previous sequential graft most likely between the small marginal and distal circumflex.  Right coronary artery: Moderate size vessel with calcification and 80% proximal stenosis with a small  area of probable thrombus in its midsegment prior to total mid occlusion.  LIMA to LAD: Widely patent and anastomosed into the mid LAD. There is retrograde filling to the proximal occlusion. The anastomosis was free of significant disease extended to the apex.  SVG sequential graft supplying a marginal and distal circumflex was totally occluded at its origin.  SVG E high marginal or ramus intermediate vessel was widely patent. There was mild 20% proximal smooth narrowing.   Left ventriculography revealed global LV dysfunction with an ejection fraction of 35 to less than 40%.   IMPRESSION:  Ischemic cardiomyopathy with an ejection fraction of 35 to less than 40% with diffuse global hypocontractility.  Significant native coronary obstructive disease with total occlusion of the proximal LAD after the septal diagonal vessel; 70% stenosis in the proximal AV groove circumflex with occlusion of the native OM Jw Covin and retrograde filling of the sequential limb distally to this mid Aimie Wagman and total occlusion of the mid right coronary artery with 80% calcified proximal stenosis and small area of thrombus in the mid RCA prior to the total occlusion.  Occluded vein graft which most likely was the sequential graft supplying an OM and distal circumflex vessel the circumflex coronary artery.  Patent SVG which seems to supply a high marginal or ramus intermediate-like vessel this 20% proximal narrowing in the graft.  Patent SVG to the PDA Bj [place]lock of the RCA.  Patent LIMA graft to the mid LAD.  POST CATHRECOMMENDATION:  The patient has an occluded graft which most likely sequentially supplied a small OM and distal circumflex vessel. 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 Platelets today are low at 81,000. He has not been on any significant antianginal regimen. Initial medical therapy will be recommended with close follow-up of his platelets. If recurrent symptomatology  develops consider possible PCI to the native circumflex   10/2017 cath  Ost Cx to Mid Cx lesion is 75% stenosed and appears unchanged from prior. THe SVG to circ is occluded  Prox LAD lesion is 100% stenosed. LIMA to LAD is patent.  Mid RCA lesion is 100% stenosed. SVG to PDA is patent.  Ramus-1 lesion is 100% stenosed. SVG  to ramus is patent proximally. Dist Graft lesion is 99% stenosed.  A drug-eluting stent was successfully placed using a STENT SYNERGY DES 3X24, after aspiration thrombectomy.  Post intervention, there is a 0% residual stenosis.  Ramus-2 lesion is 80% stenosed. Balloon angioplasty was performed using a BALLOON SAPPHIRE 2.0X15.  Post intervention, there is a 10% residual stenosis.  LV end diastolic pressure is mildly elevated.  There is no aortic valve stenosis.  Continue IV angiomax for 1 hour post procedure.  Continue aspirin and Brilinta for 1 year along with aggressive secondary prevention.   01/2018 echo Study Conclusions  - Left ventricle: The cavity size was mildly dilated. Wall   thickness was normal. Systolic function was mildly to moderately   reduced. The estimated ejection fraction was in the range of 40%   to 45%. Doppler parameters are consistent with abnormal left   ventricular relaxation (grade 1 diastolic dysfunction). Doppler   parameters are consistent with high ventricular filling pressure. - Regional wall motion abnormality: Hypokinesis of the apical   anterior, apical inferior, apical septal, and apical myocardium. - Aortic valve: Moderately calcified annulus. Trileaflet. - Mitral valve: Calcified annulus.  Recommendations:  The LV apex was suboptimally visualized. Consider contrast enhancement in the future.   Assessment and Plan   1. CAD -no symptoms - he stopped brillinta on his own. Discussed importance of DAPT until 10/2018. Seems to have had some SOB on brillinta, we will change to plavix with 300mg  x 1 then 75mg   daily, stop 10/2018  2. HTN -has some white coat HTN, home numbers remain at goal. Repeat manual check 130/60 - continue current meds  3. Hyperlipidemia -did not tolerate high dose statin - LDL has been at goal, continue crestor  F/u 6 months. Instructed to stop plavix 10/2018   [name], M.D.

## 2018-11-04 ENCOUNTER — Other Ambulatory Visit: Payer: Self-pay | Admitting: Cardiology

## 2018-11-22 ENCOUNTER — Other Ambulatory Visit: Payer: Self-pay | Admitting: Cardiology

## 2018-11-23 ENCOUNTER — Other Ambulatory Visit: Payer: Self-pay | Admitting: Cardiology

## 2018-11-27 ENCOUNTER — Other Ambulatory Visit: Payer: Self-pay | Admitting: Cardiology

## 2018-12-11 ENCOUNTER — Encounter (HOSPITAL_COMMUNITY): Payer: Self-pay | Admitting: Emergency Medicine

## 2018-12-11 ENCOUNTER — Other Ambulatory Visit: Payer: Self-pay

## 2018-12-11 ENCOUNTER — Emergency Department (HOSPITAL_COMMUNITY): Payer: Medicare Other

## 2018-12-11 ENCOUNTER — Emergency Department (HOSPITAL_COMMUNITY)
Admission: EM | Admit: 2018-12-11 | Discharge: 2018-12-11 | Disposition: A | Discharge status: Home / Self Care | Payer: Medicare Other | Attending: Emergency Medicine | Admitting: Emergency Medicine

## 2018-12-11 DIAGNOSIS — I10 Essential (primary) hypertension: Secondary | ICD-10-CM | POA: Diagnosis not present

## 2018-12-11 DIAGNOSIS — Z87891 Personal history of nicotine dependence: Secondary | ICD-10-CM | POA: Diagnosis not present

## 2018-12-11 DIAGNOSIS — I2510 Atherosclerotic heart disease of native coronary artery without angina pectoris: Secondary | ICD-10-CM | POA: Insufficient documentation

## 2018-12-11 DIAGNOSIS — Z7982 Long term (current) use of aspirin: Secondary | ICD-10-CM | POA: Diagnosis not present

## 2018-12-11 DIAGNOSIS — Z951 Presence of aortocoronary bypass graft: Secondary | ICD-10-CM | POA: Insufficient documentation

## 2018-12-11 DIAGNOSIS — Z79899 Other long term (current) drug therapy: Secondary | ICD-10-CM | POA: Insufficient documentation

## 2018-12-11 DIAGNOSIS — R109 Unspecified abdominal pain: Secondary | ICD-10-CM | POA: Insufficient documentation

## 2018-12-11 DIAGNOSIS — Z7984 Long term (current) use of oral hypoglycemic drugs: Secondary | ICD-10-CM | POA: Diagnosis not present

## 2018-12-11 DIAGNOSIS — E119 Type 2 diabetes mellitus without complications: Secondary | ICD-10-CM | POA: Insufficient documentation

## 2018-12-11 DIAGNOSIS — I252 Old myocardial infarction: Secondary | ICD-10-CM | POA: Diagnosis not present

## 2018-12-11 LAB — URINALYSIS, ROUTINE W REFLEX MICROSCOPIC
Bacteria, UA: NONE SEEN
Bilirubin Urine: NEGATIVE
Glucose, UA: >=500 mg/dL — AB
Hgb urine dipstick: NEGATIVE
Ketones, ur: NEGATIVE mg/dL
Leukocytes,Ua: NEGATIVE
Nitrite: NEGATIVE
Protein, ur: NEGATIVE mg/dL
Specific Gravity, Urine: 1.014 (ref 1.005–1.030)
pH: 6 (ref 5.0–8.0)

## 2018-12-11 LAB — CBC
HCT: 40.4 % (ref 39.0–52.0)
Hemoglobin: 13.8 g/dL (ref 13.0–17.0)
MCH: 30.4 pg (ref 26.0–34.0)
MCHC: 34.2 g/dL (ref 30.0–36.0)
MCV: 89 fL (ref 80.0–100.0)
Platelets: 107 10*3/uL — ABNORMAL LOW (ref 150–400)
RBC: 4.54 MIL/uL (ref 4.22–5.81)
RDW: 13.4 % (ref 11.5–15.5)
WBC: 9.2 10*3/uL (ref 4.0–10.5)
nRBC: 0 % (ref 0.0–0.2)

## 2018-12-11 LAB — BASIC METABOLIC PANEL
Anion gap: 10 (ref 5–15)
BUN: 15 mg/dL (ref 8–23)
CO2: 25 mmol/L (ref 22–32)
Calcium: 9.1 mg/dL (ref 8.9–10.3)
Chloride: 103 mmol/L (ref 98–111)
Creatinine, Ser: 0.94 mg/dL (ref 0.61–1.24)
GFR calc Af Amer: >60 mL/min (ref >60)
GFR calc non Af Amer: >60 mL/min (ref >60)
Glucose, Bld: 265 mg/dL — ABNORMAL HIGH (ref 70–99)
Potassium: 3.7 mmol/L (ref 3.5–5.1)
Sodium: 138 mmol/L (ref 135–145)

## 2018-12-11 MED ORDER — IBUPROFEN 800 MG PO TABS
800.0000 mg | ORAL_TABLET | Freq: Once | ORAL | Status: AC
  Administered 2018-12-11: 800 mg via ORAL
  Filled 2018-12-11: qty 1

## 2018-12-11 NOTE — ED Provider Notes (Signed)
[facility] EMERGENCY DEPARTMENT Provider Note   CSN: [id number] Arrival date & time: 12/11/18  1696    History   Chief Complaint Chief Complaint  Patient presents with  . Flank Pain    right    HPI Troy [name] is a 78 y.o. male w/ PMH of CAD s/p CABG, GERD, T2DM, HTN, HLD and nephrolithiasis presenting with right flank pain. He was in his usual state of health until he woke up this morning around 4am with acute onset right flank pain described as 11/10 stabbing pain without radiation. He mentions some nausea due to pain but denies any fever, chills or vomiting. He states he had recent travel to the beach earlier this week but cannot recall any obvious inciting event. He mentions that he noticed decreased urinary output yesterday but denies any dysuria, urgency or frequency.  Past Medical History:  Diagnosis Date  . CAD (coronary artery disease)    a. 1996 s/p CABG x 5 (LIMA->LAD, VG->OM->dLCX, VG->RI, VG->RPDA);  b.  Nuclear 2/11:  EF 34%,  large anterior and apical scar. There is mild peri-infarct ischemia;  c.  LHC 4/16:  LAD occld, LCx mid 70%, OM branch occl, RCA 80% prox, occl mid, L-LAD ok, S-OM/dCFX 100%, S-RI ok, S-RPDA ok, EF 35-40% >> med Rx d. 10/2017: patent LIMA-LAD and SVG-PDA with 99% stenosis of SVG-RI - s/p DES placement  . Diabetes mellitus   . GERD (gastroesophageal reflux disease)   . Hard of hearing    has hearing aids /BIL  . HLD (hyperlipidemia)   . Hypertension   . Ischemic cardiomyopathy    a. eval by EP in 2011 - no ICD needed at that time;  b.  Echo 4/16: inf and inf-lat HK, EF 50-55%, Gr 1 DD;  c. LHC 4/16: EF 35-40%; d. 01/2014 Cardiac MRI: EF 42%, 2/3rd thickness mid and distal ant/septal/apical scar.  . Myocardial infarction ([place]) 1996  . PAD (peripheral artery disease) ([place])    Dr. Oneida Alar in the past  . Thrombocytopenia [facility])     Patient Active Problem List   Diagnosis Date Noted  . NSTEMI (non-ST elevated myocardial infarction) ([place])  10/30/2017  . Thrombocytopenia ([place]) 01/07/2015  . NSVT (nonsustained ventricular tachycardia) ([place]) 01/03/2015  . Unstable angina ([place]) 01/03/2015  . Ischemic cardiomyopathy, EF 35% 05/2011 01/03/2015  . Acute anterior wall MI ([place]) 06/08/2011  . PVD 10/17/2009  . MURMUR 10/17/2009  . DYSPNEA 10/17/2009  . DM type 2 (diabetes mellitus, type 2) ([place]) 10/16/2009  . Essential hypertension 10/16/2009  . Coronary atherosclerosis 10/16/2009  . ARTERIAL OCCLUSIVE DISEASE 10/16/2009    Past Surgical History:  Procedure Laterality Date  . CATARACT EXTRACTION W/PHACO Right 08/06/2013   Procedure: CATARACT EXTRACTION PHACO AND INTRAOCULAR LENS PLACEMENT RIGHT EYE;  Surgeon: Tonny Branch, MD;  Location: [facility];  Service: Ophthalmology;  Laterality: Right;  CDE:  15.48  . COLONOSCOPY    . COLONOSCOPY WITH PROPOFOL N/A 10/22/2013   Procedure: COLONOSCOPY WITH PROPOFOL;  Surgeon: Irene Shipper, MD;  [facility]: [facility];  Service: Endoscopy;  Laterality: N/A;  . CORONARY ARTERY BYPASS GRAFT  1996   5 vessels  . CORONARY STENT INTERVENTION N/A 10/31/2017   Procedure: CORONARY STENT INTERVENTION;  Surgeon: Jettie Booze, MD;  Location: [place] CV LAB;  Service: Cardiovascular;  Laterality: N/A;  . HERNIA REPAIR     groin-25 yrs ago  . LEFT HEART CATH AND CORS/GRAFTS ANGIOGRAPHY N/A 10/31/2017   Procedure: LEFT HEART CATH AND CORS/GRAFTS  ANGIOGRAPHY;  Surgeon: Jettie Booze, MD;  Location: [place] CV LAB;  Service: Cardiovascular;  Laterality: N/A;  . LEFT HEART CATHETERIZATION WITH CORONARY/GRAFT ANGIOGRAM N/A 01/06/2015   Procedure: LEFT HEART CATHETERIZATION WITH Beatrix Fetters;  Surgeon: Troy Sine, MD;  Location: [facility] CATH LAB;  Service: Cardiovascular;  Laterality: N/A;    Home Medications    Prior to Admission medications   Medication Sig Start Date End Date Taking? Authorizing Provider  amLODipine (NORVASC) 10 MG tablet TAKE 1 TABLET BY MOUTH ONCE DAILY 11/27/18   Yes Branch, Alphonse Guild, MD  aspirin 81 MG EC tablet Take 81 mg by mouth daily.     Yes [provider]  carvedilol (COREG) 6.25 MG tablet TAKE 1 TABLET BY MOUTH TWICE DAILY WITH MEALS 11/06/18  Yes Branch, Alphonse Guild, MD  fish oil-omega-3 fatty acids 1000 MG capsule Take 1 g by mouth 2 (two) times daily.    Yes [provider]  glimepiride (AMARYL) 2 MG tablet Take 4 mg by mouth daily before breakfast.    Yes [provider]  isosorbide mononitrate (IMDUR) 30 MG 24 hr tablet Take 1 tablet by mouth once daily 11/23/18  Yes Branch, Alphonse Guild, MD  losartan (COZAAR) 100 MG tablet Take 1 tablet (100 mg total) by mouth daily. 02/15/18  Yes Branch, Alphonse Guild, MD  nitroGLYCERIN (NITROLINGUAL) 0.4 MG/SPRAY spray Place 1 spray under the tongue every 5 (five) minutes x 3 doses as needed for chest pain. 01/07/15  Yes Barrett, Evelene Croon, PA-C  rosuvastatin (CRESTOR) 10 MG tablet TAKE 1 TABLET BY MOUTH ONCE DAILY Patient taking differently: Take 10 mg by mouth 2 (two) times daily.  06/13/18  Yes Branch, Alphonse Guild, MD  zolpidem (AMBIEN) 10 MG tablet Take 1 tablet by mouth at bedtime.  06/01/11  Yes [provider]  JENTADUETO XR 01-999 MG TB24 Take 1 tablet by mouth daily.  11/14/18   [provider]  Saxagliptin-Metformin (KOMBIGLYZE XR) 01-999 MG TB24 Take 1 tablet by mouth daily.     [provider]   Family History Family History  Problem Relation Age of Onset  . Heart disease Mother   . Heart attack Mother   . Hypertension Mother   . Heart disease Father   . Heart attack Father   . Hypertension Father   . Cancer Brother   . Heart attack Sister   . Heart attack Brother   . Diabetes Brother   . Diabetes Sister   . Hypertension Sister   . Hypertension Brother   . Stroke Neg Hx    Social History Social History   Tobacco Use  . Smoking status: Former Smoker    Packs/day: 1.00    Years: 30.00    Pack years: 30.00    Types: Cigarettes    Start  date: 10/03/1957    Last attempt to quit: 09/27/1990    Years since quitting: 28.2  . Smokeless tobacco: Never Used  Substance Use Topics  . Alcohol use: No    Alcohol/week: 0.0 standard drinks  . Drug use: No   Allergies   Lipitor [atorvastatin calcium]  Review of Systems Review of Systems  Constitutional: Negative for chills, fatigue and fever.  Respiratory: Negative for shortness of breath and wheezing.   Cardiovascular: Negative for chest pain, palpitations and leg swelling.  Gastrointestinal: Positive for nausea. Negative for abdominal pain, constipation, diarrhea and vomiting.  Genitourinary: Positive for decreased urine volume and flank pain. Negative for dysuria, frequency and  urgency.  Neurological: Negative for dizziness, weakness, light-headedness and headaches.  All other systems reviewed and are negative.    Physical Exam Updated Vital Signs BP (!) 123/55   Pulse 65   Temp 98.3 F (36.8 C) (Oral)   Resp 16   Ht 5\' 9"  (1.753 m)   Wt 90.7 kg   SpO2 95%   BMI 29.53 kg/m   Physical Exam Constitutional:      General: He is in acute distress.     Appearance: He is normal weight.  HENT:     Mouth/Throat:     Mouth: Mucous membranes are moist.     Pharynx: Oropharynx is clear.  Eyes:     Conjunctiva/sclera: Conjunctivae normal.  Neck:     Musculoskeletal: Normal range of motion and neck supple.  Cardiovascular:     Rate and Rhythm: Normal rate and regular rhythm.     Pulses: Normal pulses.     Heart sounds: Normal heart sounds.  Pulmonary:     Effort: Pulmonary effort is normal.     Breath sounds: Normal breath sounds. No wheezing or rales.  Abdominal:     General: Abdomen is flat. Bowel sounds are normal. There is no distension.     Tenderness: There is no abdominal tenderness.  Musculoskeletal: Normal range of motion.        General: Tenderness (worsening of flank pain with hip flexion) present.  Skin:    General: Skin is warm and dry.  Neurological:      Mental Status: He is alert.      ED Treatments / Results  Labs (all labs ordered are listed, but only abnormal results are displayed) Labs Reviewed  URINALYSIS, ROUTINE W REFLEX MICROSCOPIC - Abnormal; Notable for the following components:      Result Value   Glucose, UA >=500 (*)    All other components within normal limits  CBC - Abnormal; Notable for the following components:   Platelets 107 (*)    All other components within normal limits  BASIC METABOLIC PANEL - Abnormal; Notable for the following components:   Glucose, Bld 265 (*)    All other components within normal limits    EKG None  Radiology Ct Renal Stone Study  Result Date: 12/11/2018 CLINICAL DATA:  Right flank pain over the last day. History of renal stones. EXAM: CT ABDOMEN AND PELVIS WITHOUT CONTRAST TECHNIQUE: Multidetector CT imaging of the abdomen and pelvis was performed following the standard protocol without IV contrast. COMPARISON:  CT 10/10/2014 and 09/23/2014.  MRI 04/14/2015. FINDINGS: Lower chest: Chronic scarring at the left lung base. No active chest process. Extensive coronary artery calcification. Previous CABG. Aortic atherosclerosis. Hepatobiliary: Multiple calcified stones dependent in the gallbladder. Mild nodularity of the surface of the liver raising the question of early cirrhosis. Pancreas: Normal Spleen: Normal Adrenals/Urinary Tract: Right adrenal gland is normal. 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 Few small renal cysts on the left, the largest in the ventral midportion measuring up to 2.9 cm in diameter. Nonobstructing 3 mm stone in the lower pole of the left kidney. Right kidney again shows a hyperdense cyst in the lower lateral portion measuring 0.0 cm in size, not significantly enlarged since 2016. Few other small cysts. 3 mm nonobstructing stone in the midportion. 4 mm nonobstructing stone in the lower pole. No renal swelling or hydroureteronephrosis. No sign of passing stone.  No stone the bladder. Stomach/Bowel: Normal Vascular/Lymphatic: Aortic atherosclerosis. No aneurysm. Branch vessel atherosclerosis. Reproductive: Normal Other:  No free fluid or air. Musculoskeletal: Lower lumbar degenerative changes including degenerative anterolisthesis at L4-5 of 4 mm. IMPRESSION: No acute finding to explain right flank pain. Small bilateral nonobstructing renal calculi. No evidence of renal swelling, hydronephrosis or passing stone. Chronic stable renal cysts, including a 2 cm hyperdense cyst at the lower pole the right kidney, not significantly changed since 2016. Cholelithiasis without CT evidence of cholecystitis or obstruction. Question mild nodularity of the liver surface raising possibility of early cirrhosis. Aortic atherosclerosis. Lumbar spine degenerative changes. Electronically Signed   By: Nelson Chimes M.D.   On: 12/11/2018 11:40    Procedures Procedures (including critical care time)  Medications Ordered in ED Medications  ibuprofen (ADVIL,MOTRIN) tablet 800 mg (800 mg Oral Given 12/11/18 9675)     Initial Impression / Assessment and Plan / ED Course  I have reviewed the triage vital signs and the nursing notes.  Pertinent labs & imaging results that were available during my care of the patient were reviewed by me and considered in my medical decision making (see chart for details).    Mr.Nee is a 78 yo M w/ PMH of CAD and nephrolithiasis presenting with flank pain. Wide-differential for his flank pain including UTI, nephrolithiasis, lumbar radiculopathy, diverticulitis. Currently vitals are stable. Will get cbc, bmp, ua and CT renal study to narrow down differential.  Final Clinical Impressions(s) / ED Diagnoses   Final diagnoses:  Right flank pain   No significant findings on lab and imaging studies. Most likely due to musculoskeletal pain. On re-evaluation, he states his pain has significantly improved after 800mg  ibuprofen. Will discharge home with  recommendation for OTC pain control.  ED Discharge Orders    None       Mosetta Anis, MD 12/11/18 1511    Elnora Morrison, MD 12/11/18 [phone]

## 2018-12-11 NOTE — ED Triage Notes (Signed)
C/o right flank pain rating pain 10/10.  History of kidney stones.

## 2018-12-11 NOTE — ED Notes (Signed)
Pt eating and drinking, instructed not to eat or drink until after testing done.

## 2018-12-11 NOTE — Discharge Instructions (Addendum)
Dear Almond Lint  You came to [place] with flank pain. We have determined this was not caused by a kidney stone or infection. Here are our recommendations for you at discharge:  Take ibuprofen as needed for your flank pain  Thank you for choosing [facility]

## 2019-01-12 ENCOUNTER — Other Ambulatory Visit: Payer: Self-pay | Admitting: Cardiology

## 2019-01-15 ENCOUNTER — Other Ambulatory Visit: Payer: Self-pay | Admitting: Cardiology

## 2019-01-19 ENCOUNTER — Telehealth: Payer: Self-pay | Admitting: Cardiology

## 2019-01-19 MED ORDER — AMLODIPINE BESYLATE 10 MG PO TABS: 10.0000 mg | ORAL_TABLET | Freq: Every day | ORAL | 3 refills | Status: DC

## 2019-01-19 MED ORDER — CARVEDILOL 6.25 MG PO TABS: 6.2500 mg | ORAL_TABLET | Freq: Two times a day (BID) | ORAL | 3 refills | Status: DC

## 2019-01-19 MED ORDER — ISOSORBIDE MONONITRATE ER 30 MG PO TB24: 30.0000 mg | ORAL_TABLET | Freq: Every day | ORAL | 3 refills | Status: DC

## 2019-01-19 MED ORDER — ROSUVASTATIN CALCIUM 10 MG PO TABS: 10.0000 mg | ORAL_TABLET | Freq: Every day | ORAL | 3 refills | Status: DC

## 2019-01-19 MED ORDER — LOSARTAN POTASSIUM 100 MG PO TABS: 100.0000 mg | ORAL_TABLET | Freq: Every day | ORAL | 3 refills | Status: DC

## 2019-01-19 NOTE — Telephone Encounter (Signed)
°*  STAT* If patient is at the pharmacy, call can be transferred to refill team.   1. Which medications need to be refilled?   Losartan Tab AmLODIPine Tab Isosorbide [place] [facility] a Tab Cardedilol Tab Rosuvastatin Tab Glimepriride Tab Zolpidem Tab Jentadueto XR Tab   2. Which pharmacy/location (including street and city if local pharmacy) is medication to be sent to?[facility]  3. Do they need a 30 day or 90 day supply?

## 2019-01-19 NOTE — Telephone Encounter (Signed)
Spoke with wife Joycelyn Schmid) - informed her that we will only refill the cardiac medications.  She verbalized understanding.

## 2019-02-09 ENCOUNTER — Telehealth: Payer: Self-pay | Admitting: Cardiology

## 2019-02-09 NOTE — Telephone Encounter (Signed)
LM with male at residence for pt to cb

## 2019-02-09 NOTE — Telephone Encounter (Signed)
Patient wants to go back on Plavix.He said he felt better on it, his blood was "thin". When he sticks his finger for glucose testing, it is thick and he has a hard time getting his sample

## 2019-02-09 NOTE — Telephone Encounter (Signed)
There is no medical indication to restart plavix, we typically only use for 1 year after a stent. Being on or off plavix would never affect how someone feels, essentially all it does if thin the blood to prevent clots from forming on the stent, there is no other effect that it has. plavix is not a completley benign drug, there is some increased risk of severe bleeding and without a clear indication for restarting it I would not restart.     Zandra Abts MD

## 2019-02-09 NOTE — Telephone Encounter (Signed)
Patient would like to speak with Dr.Branch in regards to being started back on blood thinner. / tg

## 2019-02-12 ENCOUNTER — Encounter: Payer: Self-pay | Admitting: Cardiology

## 2019-02-12 ENCOUNTER — Telehealth (INDEPENDENT_AMBULATORY_CARE_PROVIDER_SITE_OTHER): Payer: Medicare Other | Admitting: Cardiology

## 2019-02-12 ENCOUNTER — Telehealth: Payer: Self-pay | Admitting: *Deleted

## 2019-02-12 VITALS — BP 120/52 | HR 62 | Ht 69.0 in | Wt 200.0 lb

## 2019-02-12 DIAGNOSIS — I2510 Atherosclerotic heart disease of native coronary artery without angina pectoris: Secondary | ICD-10-CM | POA: Diagnosis not present

## 2019-02-12 DIAGNOSIS — I10 Essential (primary) hypertension: Secondary | ICD-10-CM

## 2019-02-12 DIAGNOSIS — E782 Mixed hyperlipidemia: Secondary | ICD-10-CM

## 2019-02-12 NOTE — Telephone Encounter (Signed)
Patient spoke directly to [facility]

## 2019-02-12 NOTE — Patient Instructions (Addendum)
Medication Instructions:   Your physician recommends that you continue on your current medications as directed. Please refer to the Current Medication list given to you today.  Labwork:  NONE  Testing/Procedures:  NONE  Follow-Up:  Your physician recommends that you schedule a follow-up appointment in: 4 months.  Any Other Special Instructions Will Be Listed Below (If Applicable).  If you need a refill on your cardiac medications before your next appointment, please call your pharmacy. 

## 2019-02-12 NOTE — Progress Notes (Signed)
Virtual Visit via Telephone Note   This visit type was conducted due to national recommendations for restrictions regarding the COVID-19 Pandemic (e.g. social distancing) in an effort to limit this patient's exposure and mitigate transmission in our community.  Due to his co-morbid illnesses, this patient is at least at moderate risk for complications without adequate follow up.  This format is felt to be most appropriate for this patient at this time.  The patient did not have access to video technology/had technical difficulties with video requiring transitioning to audio format only (telephone).  All issues noted in this document were discussed and addressed.  No physical exam could be performed with this format.  Please refer to the patient's chart for his  consent to telehealth for [facility].   Date:  02/12/2019   ID:  Troy [name], DOB [birth year], MRN [id number]  Patient Location: Home Provider Location: Office  PCP:  Troy Bowen, MD  Cardiologist:  Troy Dolly, MD  Electrophysiologist:  None   Evaluation Performed:  Follow-Up Visit  Chief Complaint:  6 month follow up  History of Present Illness:    Troy [name] is a 78 y.o. male with seen today for follow up of the following medical problems.   1. CAD - hx of CABG in 1996 - cath 12/2014 showed patent LM, LAD occluded, LCX 70% mid, RCA 80% prox. LIMA-LAD patent SVG-OM occluded, SVG-ramus patent. LVEF 35-40%. Managed medically, with recs if symptoms on medical therapy can consider LCX intervetntion - 12/2014 echo LVEF 63-01%, grade I diastolic dysfunction   - admit 10/2017 with NTEMI, peak trop 0.3. Cath as reported below, receied DES to SVG-ramus - 01/2018 ech LVEF 60-10%, grade I diastolic dysfunction apical hypokinesis.    - SOB on brillinta, was changed to plavix. Plavix stopped 10/2018 after completed 1 year of therapy.  - No recent chest pain, no SOB or DOE    2. HTN - has had some white coat  HTN in the past - compliant with meds - home bp's typicalls 120s/60s  3. Hyperlipidemia - did not tolerate lipitor,hadbeen onsimvastatinbut later changed to crestor  04/2018 TC 96 TG 88 HDL 31 LDL 47 - compliant with statin, tolering crestor without troubles.    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 Has pontoon boat, enjoys fishing. Spending his time currently at the beach, isolating himself from COVID-19 risks   The patient does not have symptoms concerning for COVID-19 infection (fever, chills, cough, or new shortness of breath).    Past Medical History:  Diagnosis Date  . CAD (coronary artery disease)    a. 1996 s/p CABG x 5 (LIMA->LAD, VG->OM->dLCX, VG->RI, VG->RPDA);  b.  Nuclear 2/11:  EF 34%,  large anterior and apical scar. There is mild peri-infarct ischemia;  c.  LHC 4/16:  LAD occld, LCx mid 70%, OM Gerod Caligiuri occl, RCA 80% prox, occl mid, L-LAD ok, S-OM/dCFX 100%, S-RI ok, S-RPDA ok, EF 35-40% >> med Rx d. 10/2017: patent LIMA-LAD and SVG-PDA with 99% stenosis of SVG-RI - s/p DES placement  . Diabetes mellitus   . GERD (gastroesophageal reflux disease)   . Hard of hearing    has hearing aids /BIL  . HLD (hyperlipidemia)   . Hypertension   . Ischemic cardiomyopathy    a. eval by EP in 2011 - no ICD needed at that time;  b.  Echo 4/16: inf and inf-lat HK, EF 50-55%, Gr 1 DD;  c. LHC 4/16: EF 35-40%; d. 01/2014  Cardiac MRI: EF 42%, 2/3rd thickness mid and distal ant/septal/apical scar.  . Myocardial infarction ([place]) 1996  . PAD (peripheral artery disease) (H[facility])    Dr. Oneida [name] in the past  . Thrombocytopenia [facility])    Past Surgical History:  Procedure Laterality Date  . CATARACT EXTRACTION W/PHACO Right 08/06/2013   Procedure: CATARACT EXTRACTION PHACO AND INTRAOCULAR LENS PLACEMENT RIGHT EYE;  Surgeon: Troy Kenza Munar, MD;  Location: [facility];  Service: Ophthalmology;  Laterality: Right;  CDE:  15.48  . COLONOSCOPY    . COLONOSCOPY WITH PROPOFOL N/A 10/22/2013    Procedure: COLONOSCOPY WITH PROPOFOL;  Surgeon: Troy Shipper, MD;  Location: [facility] ENDOSCOPY;  Service: Endoscopy;  Laterality: N/A;  . CORONARY ARTERY BYPASS GRAFT  1996   5 vessels  . CORONARY STENT INTERVENTION N/A 10/31/2017   Procedure: CORONARY STENT INTERVENTION;  Surgeon: Troy Booze, MD;  Location: [facility] CV LAB;  Service: Cardiovascular;  Laterality: N/A;  . HERNIA REPAIR     groin-25 yrs ago  . LEFT HEART CATH AND CORS/GRAFTS ANGIOGRAPHY N/A 10/31/2017   Procedure: LEFT HEART CATH AND CORS/GRAFTS ANGIOGRAPHY;  Surgeon: Troy Booze, MD;  Location: [place] CV LAB;  Service: Cardiovascular;  Laterality: N/A;  . LEFT HEART CATHETERIZATION WITH CORONARY/GRAFT ANGIOGRAM N/A 01/06/2015   Procedure: LEFT HEART CATHETERIZATION WITH Troy [name];  Surgeon: Troy Sine, MD;  Location: [facility] CATH LAB;  Service: Cardiovascular;  Laterality: N/A;     Current Meds  Medication Sig  . amLODipine (NORVASC) 10 MG tablet Take 1 tablet (10 mg total) by mouth daily.  Troy [name] aspirin 81 MG EC tablet Take 81 mg by mouth 2 (two) times a day.   . carvedilol (COREG) 6.25 MG tablet Take 1 tablet (6.25 mg total) by mouth 2 (two) times daily with a meal.  . fish oil-omega-3 fatty acids 1000 MG capsule Take 1 g by mouth 2 (two) times daily.   Troy [name] glimepiride (AMARYL) 2 MG tablet Take 4 mg by mouth daily before breakfast.   . isosorbide mononitrate (IMDUR) 30 MG 24 hr tablet Take 1 tablet (30 mg total) by mouth daily.  . JENTADUETO XR 01-999 MG TB24 Take 1 tablet by mouth daily.   Troy [name] losartan (COZAAR) 100 MG tablet Take 1 tablet (100 mg total) by mouth daily.  . nitroGLYCERIN (NITROLINGUAL) 0.4 MG/SPRAY spray Place 1 spray under the tongue every 5 (five) minutes x 3 doses as needed for chest pain.  . rosuvastatin (CRESTOR) 10 MG tablet Take 1 tablet (10 mg total) by mouth daily. (Patient taking differently: Take 10 mg by mouth 2 (two) times a day. )  . zolpidem (AMBIEN) 10 MG tablet Take 1  tablet by mouth at bedtime.      Allergies:   Lipitor [atorvastatin calcium]   Social History   Tobacco Use  . Smoking status: Former Smoker    Packs/day: 1.00    Years: 30.00    Pack years: 30.00    Types: Cigarettes    Start date: 10/03/1957    Last attempt to quit: 09/27/1990    Years since quitting: 28.3  . Smokeless tobacco: Never Used  Substance Use Topics  . Alcohol use: No    Alcohol/week: 0.0 standard drinks  . Drug use: No     Family Hx: The patient's family history includes Cancer in his brother; Diabetes in his brother and sister; Heart attack in his brother, father, mother, and sister; Heart disease in his father and mother; Hypertension in his brother,  father, mother, and sister. There is no history of Stroke.  ROS:   Please see the history of present illness.     All other systems reviewed and are negative.   Prior CV studies:   The following studies were reviewed today:  12/2014 Cath  HEMODYNAMICS:  Central Aorta: 170/59  Left Ventricle: 170/19  ANGIOGRAPHY:  Left main: Normal vessel which bifurcated into the LAD and left circumflex coronary artery  LAD: Moderate size proximal vessel that was totally occluded after the first septal and diagonal vessel.  Left circumflex: good size vessel that had 70% mid stenosis before a small marginal Marques Ericson. The distal vessel was small caliber and there was retrograde filling of a previous sequential graft most likely between the small marginal and distal circumflex.  Right coronary artery: Moderate size vessel with calcification and 80% proximal stenosis with a small area of probable thrombus in its midsegment prior to total mid occlusion.  LIMA to LAD: Widely patent and anastomosed into the mid LAD. There is retrograde filling to the proximal occlusion. The anastomosis was free of significant disease extended to the apex.  SVG sequential graft supplying a marginal and distal circumflex was totally  occluded at its origin.  SVG E high marginal or ramus intermediate vessel was widely patent. There was mild 20% proximal smooth narrowing.   Left ventriculography revealed global LV dysfunction with an ejection fraction of 35 to less than 40%.   IMPRESSION:  Ischemic cardiomyopathy with an ejection fraction of 35 to less than 40% with diffuse global hypocontractility.  Significant native coronary obstructive disease with total occlusion of the proximal LAD after the septal diagonal vessel; 70% stenosis in the proximal AV groove circumflex with occlusion of the native OM Katrinna Travieso and retrograde filling of the sequential limb distally to this mid Mellonie Guess and total occlusion of the mid right coronary artery with 80% calcified proximal stenosis and small area of thrombus in the mid RCA prior to the total occlusion.  Occluded vein graft which most likely was the sequential graft supplying an OM and distal circumflex vessel the circumflex coronary artery.  Patent SVG which seems to supply a high marginal or ramus intermediate-like vessel this 20% proximal narrowing in the graft.  Patent SVG to the PDA Vishwa Dais of the RCA.  Patent LIMA graft to the mid LAD.  POST CATHRECOMMENDATION:  The patient has an occluded graft which most likely sequentially supplied a small OM and distal circumflex vessel. 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 Platelets today are low at 81,000. He has not been on any significant antianginal regimen. Initial medical therapy will be recommended with close follow-up of his platelets. If recurrent symptomatology develops consider possible PCI to the native circumflex   10/2017 cath  Ost Cx to Mid Cx lesion is 75% stenosed and appears unchanged from prior. THe SVG to circ is occluded  Prox LAD lesion is 100% stenosed. LIMA to LAD is patent.  Mid RCA lesion is 100% stenosed. SVG to PDA is patent.  Ramus-1 lesion is 100% stenosed. SVG to ramus is  patent proximally. Dist Graft lesion is 99% stenosed.  A drug-eluting stent was successfully placed using a STENT SYNERGY DES 3X24, after aspiration thrombectomy.  Post intervention, there is a 0% residual stenosis.  Ramus-2 lesion is 80% stenosed. Balloon angioplasty was performed using a BALLOON SAPPHIRE 2.0X15.  Post intervention, there is a 10% residual stenosis.  LV end diastolic pressure is mildly elevated.  There is no aortic valve  stenosis.  Continue IV angiomax for 1 hour post procedure.  Continue aspirin and Brilinta for 1 year along with aggressive secondary prevention.   01/2018 echo Study Conclusions  - Left ventricle: The cavity size was mildly dilated. Wall thickness was normal. Systolic function was mildly to moderately reduced. The estimated ejection fraction was in the range of 40% to 45%. Doppler parameters are consistent with abnormal left ventricular relaxation (grade 1 diastolic dysfunction). Doppler parameters are consistent with high ventricular filling pressure. - Regional wall motion abnormality: Hypokinesis of the apical anterior, apical inferior, apical septal, and apical myocardium. - Aortic valve: Moderately calcified annulus. Trileaflet. - Mitral valve: Calcified annulus.  Recommendations: The LV apex was suboptimally visualized. Consider contrast enhancement in the future.  Labs/Other Tests and Data Reviewed:    EKG:  No ECG reviewed.  Recent Labs: 12/11/2018: BUN 15; Creatinine, Ser 0.94; Hemoglobin 13.8; Platelets 107; Potassium 3.7; Sodium 138   Recent Lipid Panel Lab Results  Component Value Date/Time   CHOL 109 10/31/2017 03:02 AM   TRIG 115 10/31/2017 03:02 AM   HDL 32 (L) 10/31/2017 03:02 AM   CHOLHDL 3.4 10/31/2017 03:02 AM   LDLCALC 54 10/31/2017 03:02 AM    Wt Readings from Last 3 Encounters:  02/12/19 200 lb (90.7 kg)  12/11/18 200 lb (90.7 kg)  07/31/18 199 lb 3.2 oz (90.4 kg)     Objective:     Vital Signs:  BP (!) 120/52   Pulse 62   Ht 5\' 9"  (1.753 m)   Wt 200 lb (90.7 kg)   BMI 29.53 kg/m    Normal affect. Normal speech pattern and tone. Comfortable, no apparent distress. No audible signs of SOB or wheezing   ASSESSMENT & PLAN:    1. CAD - no symptoms, continue current meds  2. HTN -home bp's at goal, Of not has had some white coat HTN in the past - continue currentmeds  3. Hyperlipidemia -did not tolerate high dose statin - toleraing current crestor dosing, LDL has been at goal, Continue current meds    COVID-19 Education: The signs and symptoms of COVID-19 were discussed with the patient and how to seek care for testing (follow up with PCP or arrange E-visit).  The importance of social distancing was discussed today.  Time:   Today, I have spent 15 minutes with the patient with telehealth technology discussing the above problems.     Medication Adjustments/Labs and Tests Ordered: Current medicines are reviewed at length with the patient today.  Concerns regarding medicines are outlined above.   Tests Ordered: No orders of the defined types were placed in this encounter.   Medication Changes: No orders of the defined types were placed in this encounter.   Disposition:  Follow up 4 months  Signed, Troy Dolly, MD  02/12/2019 9:37 AM    [place]

## 2019-02-12 NOTE — Telephone Encounter (Signed)
Patient verbally consented for telehealth visits with [facility] and understands that his insurance company will be billed for the encounter.   

## 2019-06-15 ENCOUNTER — Telehealth: Payer: Self-pay | Admitting: Cardiology

## 2019-06-15 NOTE — Telephone Encounter (Signed)
Virtual Visit Pre-Appointment Phone Call  "(Name), I am calling you today to discuss your upcoming appointment. We are currently trying to limit exposure to the virus that causes COVID-19 by seeing patients at home rather than in the office."  "What is the BEST phone number to call the day of the visit?" - 1. Do you have or have access to (through a family member/friend) a smartphone with video capability that we can use for your visit?" a. If yes - list this number in appt notes as cell (if different from BEST phone #) and list the appointment type as a VIDEO visit in appointment notes b. If no - list the appointment type as a PHONE visit in appointment notes  2. Confirm consent - "In the setting of the current Covid19 crisis, you are scheduled for a (phone or video) visit with your provider on (date) at (time).  Just as we do with many in-office visits, in order for you to participate in this visit, we must obtain consent.  If you'd like, I can send this to your mychart (if signed up) or email for you to review.  Otherwise, I can obtain your verbal consent now.  All virtual visits are billed to your insurance company just like a normal visit would be.  By agreeing to a virtual visit, we'd like you to understand that the technology does not allow for your provider to perform an examination, and thus may limit your provider's ability to fully assess your condition. If your provider identifies any concerns that need to be evaluated in person, we will make arrangements to do so.  Finally, though the technology is pretty good, we cannot assure that it will always work on either your or our end, and in the setting of a video visit, we may have to convert it to a phone-only visit.  In either situation, we cannot ensure that we have a secure connection.  Are you willing to proceed?" STAFF: Did the patient verbally acknowledge consent to telehealth visit? Document YES/NO here: YES   3. Advise patient to  be prepared - "Two hours prior to your appointment, go ahead and check your blood pressure, pulse, oxygen saturation, and your weight (if you have the equipment to check those) and write them all down. When your visit starts, your provider will ask you for this information. If you have an Apple Watch or Kardia device, please plan to have heart rate information ready on the day of your appointment. Please have a pen and paper handy nearby the day of the visit as well."  4. Give patient instructions for MyChart download to smartphone OR Doximity/[email or web address].me as below if video visit (depending on what platform provider is using)  5. Inform patient they will receive a phone call 15 minutes prior to their appointment time (may be from unknown caller ID) so they should be prepared to answer    TELEPHONE CALL NOTE  Holten D Vi has been deemed a candidate for a follow-up tele-health visit to limit community exposure during the Covid-19 pandemic. I spoke with the patient via phone to ensure availability of phone/video source, confirm preferred email & phone number, and discuss instructions and expectations.  I reminded Troy [name] to be prepared with any vital sign and/or heart rhythm information that could potentially be obtained via home monitoring, at the time of his visit. I reminded Troy [name] to expect a phone call prior to his visit.  Chanda Busing  06/15/2019 2:42 PM   INSTRUCTIONS FOR DOWNLOADING THE MYCHART APP TO SMARTPHONE  - The patient must first make sure to have activated MyChart and know their login information - If Apple, go to [employer] and type in MyChart in the search bar and download the app. If Android, ask patient to go to [employer] and type in [place] in the search bar and download the app. The app is free but as with any other app downloads, their phone may require them to verify saved payment information or Apple/Android password.  - The patient will need to  then log into the app with their MyChart username and password, and select [facility] as their healthcare provider to link the account. When it is time for your visit, go to the MyChart app, find appointments, and click Begin Video Visit. Be sure to Select Allow for your device to access the Microphone and Camera for your visit. You will then be connected, and your provider will be with you shortly.  **If they have any issues connecting, or need assistance please contact MyChart service desk [phone])**  **If using a computer, in order to ensure the best quality for their visit they will need to use either of the following Internet Browsers: [employer], or Google Chrome**  IF USING DOXIMITY or DOXY.ME - The patient will receive a link just prior to their visit by text.     FULL LENGTH CONSENT FOR TELE-HEALTH VISIT   I hereby voluntarily request, consent and authorize [facility] and its employed or contracted physicians, physician assistants, nurse practitioners or other licensed health care professionals (the Practitioner), to provide me with telemedicine health care services (the Services") as deemed necessary by the treating Practitioner. I acknowledge and consent to receive the Services by the Practitioner via telemedicine. I understand that the telemedicine visit will involve communicating with the Practitioner through live audiovisual communication technology and the disclosure of certain medical information by electronic transmission. I acknowledge that I have been given the opportunity to request an in-person assessment or other available alternative prior to the telemedicine visit and am voluntarily participating in the telemedicine visit.  I understand that I have the right to withhold or withdraw my consent to the use of telemedicine in the course of my care at any time, without affecting my right to future care or treatment, and that the Practitioner or I may  terminate the telemedicine visit at any time. I understand that I have the right to inspect all information obtained and/or recorded in the course of the telemedicine visit and may receive copies of available information for a reasonable fee.  I understand that some of the potential risks of receiving the Services via telemedicine include:   Delay or interruption in medical evaluation due to technological equipment failure or disruption;  Information transmitted may not be sufficient (e.g. poor resolution of images) to allow for appropriate medical decision making by the Practitioner; and/or   In rare instances, security protocols could fail, causing a breach of personal health information.  Furthermore, I acknowledge that it is my responsibility to provide information about my medical history, conditions and care that is complete and accurate to the best of my ability. I acknowledge that Practitioner's advice, recommendations, and/or decision may be based on factors not within their control, such as incomplete or inaccurate data provided by me or distortions of diagnostic images or specimens that may result from electronic transmissions. I understand that the practice of medicine  is not an Chief Strategy Officer and that Practitioner makes no warranties or guarantees regarding treatment outcomes. I acknowledge that I will receive a copy of this consent concurrently upon execution via email to the email address I last provided but may also request a printed copy by calling the office of [facility].    I understand that my insurance will be billed for this visit.   I have read or had this consent read to me.  I understand the contents of this consent, which adequately explains the benefits and risks of the Services being provided via telemedicine.   I have been provided ample opportunity to ask questions regarding this consent and the Services and have had my questions answered to my satisfaction.  I  give my informed consent for the services to be provided through the use of telemedicine in my medical care  By participating in this telemedicine visit I agree to the above.

## 2019-06-20 ENCOUNTER — Encounter: Payer: Self-pay | Admitting: *Deleted

## 2019-06-20 ENCOUNTER — Encounter: Payer: Self-pay | Admitting: Cardiology

## 2019-06-20 ENCOUNTER — Telehealth (INDEPENDENT_AMBULATORY_CARE_PROVIDER_SITE_OTHER): Payer: Medicare Other | Admitting: Cardiology

## 2019-06-20 VITALS — BP 129/57 | HR 58 | Ht 69.0 in | Wt 205.0 lb

## 2019-06-20 DIAGNOSIS — I10 Essential (primary) hypertension: Secondary | ICD-10-CM | POA: Diagnosis not present

## 2019-06-20 DIAGNOSIS — I2510 Atherosclerotic heart disease of native coronary artery without angina pectoris: Secondary | ICD-10-CM | POA: Diagnosis not present

## 2019-06-20 DIAGNOSIS — E782 Mixed hyperlipidemia: Secondary | ICD-10-CM | POA: Diagnosis not present

## 2019-06-20 NOTE — Patient Instructions (Signed)

## 2019-06-20 NOTE — Progress Notes (Signed)
Virtual Visit via Telephone Note   This visit type was conducted due to national recommendations for restrictions regarding the COVID-19 Pandemic (e.g. social distancing) in an effort to limit this patient's exposure and mitigate transmission in our community.  Due to his co-morbid illnesses, this patient is at least at moderate risk for complications without adequate follow up.  This format is felt to be most appropriate for this patient at this time.  The patient did not have access to video technology/had technical difficulties with video requiring transitioning to audio format only (telephone).  All issues noted in this document were discussed and addressed.  No physical exam could be performed with this format.  Please refer to the patient's chart for his  consent to telehealth for [facility].   Date:  06/20/2019   ID:  Troy [name], DOB Jan 29, 1941, MRN [id number]  Patient Location: Home Provider Location: Office  PCP:  Reynold Bowen, MD  Cardiologist:  Carlyle Dolly, MD  Electrophysiologist:  None   Evaluation Performed:  Follow-Up Visit  Chief Complaint:  Follow visit  History of Present Illness:    Troy [name] is a 78 y.o. male seen today for follow up of the following medical problems.   1. CAD - hx of CABG in 1996 - cath 12/2014 showed patent LM, LAD occluded, LCX 70% mid, RCA 80% prox. LIMA-LAD patent SVG-OM occluded, SVG-ramus patent. LVEF 35-40%. Managed medically, with recs if symptoms on medical therapy can consider LCX intervetntion - 12/2014 echo LVEF 99991111, grade I diastolic dysfunction   - admit 10/2017 with NTEMI, peak trop 0.3. Cath as reported below, receied DES to SVG-ramus - 01/2018 ech LVEF A999333, grade I diastolic dysfunction apical hypokinesis.  - SOB on brillinta, was changed to plavix. Plavix stopped 10/2018 after completed 1 year of therapy.    - no recent chest pain, no sob or doe - compliant with meds  2. HTN - has had  some white coat HTN in the past - compliant with meds  3. 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 Has pontoon boat, enjoys fishing.Spending his time currently at the beach, isolating himself from COVID-19 risks         The patient does not have symptoms concerning for COVID-19 infection (fever, chills, cough, or new shortness of breath).    Past Medical History:  Diagnosis Date  . CAD (coronary artery disease)    a. 1996 s/p CABG x 5 (LIMA->LAD, VG->OM->dLCX, VG->RI, VG->RPDA);  b.  Nuclear 2/11:  EF 34%,  large anterior and apical scar. There is mild peri-infarct ischemia;  c.  LHC 4/16:  LAD occld, LCx mid 70%, OM Adhrit Krenz occl, RCA 80% prox, occl mid, L-LAD ok, S-OM/dCFX 100%, S-RI ok, S-RPDA ok, EF 35-40% >> med Rx d. 10/2017: patent LIMA-LAD and SVG-PDA with 99% stenosis of SVG-RI - s/p DES placement  . Diabetes mellitus   . GERD (gastroesophageal reflux disease)   . Hard of hearing    has hearing aids /BIL  . HLD (hyperlipidemia)   . Hypertension   . Ischemic cardiomyopathy    a. eval by EP in 2011 - no ICD needed at that time;  b.  Echo 4/16: inf and inf-lat HK, EF 50-55%, Gr 1 DD;  c. LHC 4/16: EF 35-40%; d. 01/2014 Cardiac MRI: EF  42%, 2/3rd thickness mid and distal ant/septal/apical scar.  . Myocardial infarction ([place]) 1996  . PAD (peripheral artery disease) (HCC)    Dr. Oneida Alar in the past  . Thrombocytopenia [facility])    Past Surgical History:  Procedure Laterality Date  . CATARACT EXTRACTION W/PHACO Right 08/06/2013   Procedure: CATARACT EXTRACTION PHACO AND INTRAOCULAR LENS PLACEMENT RIGHT EYE;  Surgeon: Tonny Amaliya Whitelaw, MD;  Location: [facility];  Service: Ophthalmology;  Laterality: Right;  CDE:  15.48  . COLONOSCOPY    . COLONOSCOPY WITH PROPOFOL N/A 10/22/2013   Procedure: COLONOSCOPY WITH PROPOFOL;  Surgeon: Irene Shipper,  MD;  Location: [facility];  Service: Endoscopy;  Laterality: N/A;  . CORONARY ARTERY BYPASS GRAFT  1996   5 vessels  . CORONARY STENT INTERVENTION N/A 10/31/2017   Procedure: CORONARY STENT INTERVENTION;  Surgeon: Jettie Booze, MD;  Location: [place] CV LAB;  Service: Cardiovascular;  Laterality: N/A;  . HERNIA REPAIR     groin-25 yrs ago  . LEFT HEART CATH AND CORS/GRAFTS ANGIOGRAPHY N/A 10/31/2017   Procedure: LEFT HEART CATH AND CORS/GRAFTS ANGIOGRAPHY;  Surgeon: Jettie Booze, MD;  Location: [place] CV LAB;  Service: Cardiovascular;  Laterality: N/A;  . LEFT HEART CATHETERIZATION WITH CORONARY/GRAFT ANGIOGRAM N/A 01/06/2015   Procedure: LEFT HEART CATHETERIZATION WITH Beatrix Fetters;  Surgeon: Troy Sine, MD;  Location: [facility] CATH LAB;  Service: Cardiovascular;  Laterality: N/A;     No outpatient medications have been marked as taking for the 06/20/19 encounter (Appointment) with Troy Lenis, MD.     Allergies:   Lipitor [atorvastatin calcium]   Social History   Tobacco Use  . Smoking status: Former Smoker    Packs/day: 1.00    Years: 30.00    Pack years: 30.00    Types: Cigarettes    Start date: 10/03/1957    Quit date: 09/27/1990    Years since quitting: 28.7  . Smokeless tobacco: Never Used  Substance Use Topics  . Alcohol use: No    Alcohol/week: 0.0 standard drinks  . Drug use: No     Family Hx: The patient's family history includes Cancer in his brother; Diabetes in his brother and sister; Heart attack in his brother, father, mother, and sister; Heart disease in his father and mother; Hypertension in his brother, father, mother, and sister. There is no history of Stroke.  ROS:   Please see the history of present illness.     All other systems reviewed and are negative.   Prior CV studies:   The following studies were reviewed today:  12/2014 Cath  HEMODYNAMICS:  Central Aorta: 170/59  Left Ventricle: 170/19   ANGIOGRAPHY:  Left main: Normal vessel which bifurcated into the LAD and left circumflex coronary artery  LAD: Moderate size proximal vessel that was totally occluded after the first septal and diagonal vessel.  Left circumflex: good size vessel that had 70% mid stenosis before a small marginal Magdalynn Davilla. The distal vessel was small caliber and there was retrograde filling of a previous sequential graft most likely between the small marginal and distal circumflex.  Right coronary artery: Moderate size vessel with calcification and 80% proximal stenosis with a small area of probable thrombus in its midsegment prior to total mid occlusion.  LIMA to LAD: Widely patent and anastomosed into the mid LAD. There is retrograde filling to the proximal occlusion. The anastomosis was free of significant disease extended to the apex.  SVG sequential graft supplying a marginal and distal circumflex  was totally occluded at its origin.  SVG E high marginal or ramus intermediate vessel was widely patent. There was mild 20% proximal smooth narrowing.   Left ventriculography revealed global LV dysfunction with an ejection fraction of 35 to less than 40%.   IMPRESSION:  Ischemic cardiomyopathy with an ejection fraction of 35 to less than 40% with diffuse global hypocontractility.  Significant native coronary obstructive disease with total occlusion of the proximal LAD after the septal diagonal vessel; 70% stenosis in the proximal AV groove circumflex with occlusion of the native OM Malissie Musgrave and retrograde filling of the sequential limb distally to this mid Demond Shallenberger and total occlusion of the mid right coronary artery with 80% calcified proximal stenosis and small area of thrombus in the mid RCA prior to the total occlusion.  Occluded vein graft which most likely was the sequential graft supplying an OM and distal circumflex vessel the circumflex coronary artery.  Patent SVG which seems to supply a  high marginal or ramus intermediate-like vessel this 20% proximal narrowing in the graft.  Patent SVG to the PDA Danilo Cappiello of the RCA.  Patent LIMA graft to the mid LAD.  POST CATHRECOMMENDATION:  The patient has an occluded graft which most likely sequentially supplied a small OM and distal circumflex vessel. 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 Platelets today are low at 81,000. He has not been on any significant antianginal regimen. Initial medical therapy will be recommended with close follow-up of his platelets. If recurrent symptomatology develops consider possible PCI to the native circumflex   10/2017 cath  Ost Cx to Mid Cx lesion is 75% stenosed and appears unchanged from prior. THe SVG to circ is occluded  Prox LAD lesion is 100% stenosed. LIMA to LAD is patent.  Mid RCA lesion is 100% stenosed. SVG to PDA is patent.  Ramus-1 lesion is 100% stenosed. SVG to ramus is patent proximally. Dist Graft lesion is 99% stenosed.  A drug-eluting stent was successfully placed using a STENT SYNERGY DES 3X24, after aspiration thrombectomy.  Post intervention, there is a 0% residual stenosis.  Ramus-2 lesion is 80% stenosed. Balloon angioplasty was performed using a BALLOON SAPPHIRE 2.0X15.  Post intervention, there is a 10% residual stenosis.  LV end diastolic pressure is mildly elevated.  There is no aortic valve stenosis.  Continue IV angiomax for 1 hour post procedure.  Continue aspirin and Brilinta for 1 year along with aggressive secondary prevention.   01/2018 echo Study Conclusions  - Left ventricle: The cavity size was mildly dilated. Wall thickness was normal. Systolic function was mildly to moderately reduced. The estimated ejection fraction was in the range of 40% to 45%. Doppler parameters are consistent with abnormal left ventricular relaxation (grade 1 diastolic dysfunction). Doppler parameters are consistent with high ventricular  filling pressure. - Regional wall motion abnormality: Hypokinesis of the apical anterior, apical inferior, apical septal, and apical myocardium. - Aortic valve: Moderately calcified annulus. Trileaflet. - Mitral valve: Calcified annulus.  Recommendations: The LV apex was suboptimally visualized. Consider contrast enhancement in the future.  Labs/Other Tests and Data Reviewed:    EKG:  No ECG reviewed.  Recent Labs: 12/11/2018: BUN 15; Creatinine, Ser 0.94; Hemoglobin 13.8; Platelets 107; Potassium 3.7; Sodium 138   Recent Lipid Panel Lab Results  Component Value Date/Time   CHOL 109 10/31/2017 03:02 AM   TRIG 115 10/31/2017 03:02 AM   HDL 32 (L) 10/31/2017 03:02 AM   CHOLHDL 3.4 10/31/2017 03:02 AM   LDLCALC 54  10/31/2017 03:02 AM    Wt Readings from Last 3 Encounters:  02/12/19 200 lb (90.7 kg)  12/11/18 200 lb (90.7 kg)  07/31/18 199 lb 3.2 oz (90.4 kg)     Objective:    Vital Signs:   Today's Vitals   06/20/19 1007  BP: (!) 129/57  Pulse: (!) 58  Weight: 205 lb (93 kg)  Height: 5\' 9"  (1.753 m)   Body mass index is 30.27 kg/m. NOrmal affect. Normal speech pattern and tone. Comfortable, no apparent distress. No audible signs of SOB or wheezing.   ASSESSMENT & PLAN:    1. CAD -doing well without symptoms, continue current meds  2. HTN -at goal, continue current meds  3. Hyperlipidemia -continue statin, request labs from pcp  COVID-19 Education: The signs and symptoms of COVID-19 were discussed with the patient and how to seek care for testing (follow up with PCP or arrange E-visit).  The importance of social distancing was discussed today.  Time:   Today, I have spent 14 minutes with the patient with telehealth technology discussing the above problems.     Medication Adjustments/Labs and Tests Ordered: Current medicines are reviewed at length with the patient today.  Concerns regarding medicines are outlined above.   Tests Ordered: No orders of  the defined types were placed in this encounter.   Medication Changes: No orders of the defined types were placed in this encounter.   Follow Up:  In Person in 6 month(s)  Signed, Carlyle Dolly, MD  06/20/2019 9:10 AM    [place]

## 2019-11-13 ENCOUNTER — Other Ambulatory Visit: Payer: Self-pay | Admitting: Cardiology

## 2019-12-04 ENCOUNTER — Other Ambulatory Visit: Payer: Self-pay | Admitting: Cardiology

## 2020-01-08 ENCOUNTER — Telehealth: Payer: Medicare Other | Admitting: Cardiology

## 2020-01-09 ENCOUNTER — Ambulatory Visit: Payer: Medicare Other | Admitting: Cardiology

## 2020-01-09 NOTE — Progress Notes (Deleted)
Clinical Summary Mr. Wempe is a 79 y.o.male seen today for follow up of the following medical problems.   1. CAD - hx of CABG in 1996 - cath 12/2014 showed patent LM, LAD occluded, LCX 70% mid, RCA 80% prox. LIMA-LAD patent SVG-OM occluded, SVG-ramus patent. LVEF 35-40%. Managed medically, with recs if symptoms on medical therapy can consider LCX intervetntion - 12/2014 echo LVEF 99991111, grade I diastolic dysfunction   - admit 10/2017 with NTEMI, peak trop 0.3. Cath as reported below, receied DES to SVG-ramus - 01/2018 ech LVEF A999333, grade I diastolic dysfunction apical hypokinesis.  - SOB on brillinta, was changed to plavix. Plavix stopped 10/2018 after completed 1 year of therapy.   - no recent chest pain, no sob or doe - compliant with meds  2. HTN - has had some white coat HTN in the past - compliant with meds  3. 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 CAD (coronary artery disease)    a. 1996 s/p CABG x 5 (LIMA->LAD, VG->OM->dLCX, VG->RI, VG->RPDA);  b.  Nuclear 2/11:  EF 34%,  large anterior and apical scar. There is mild peri-infarct ischemia;  c.  LHC 4/16:  LAD occld, LCx mid 70%, OM Shawnell Dykes occl, RCA 80% prox, occl mid, L-LAD ok, S-OM/dCFX 100%, S-RI ok, S-RPDA ok, EF 35-40% >> med Rx d. 10/2017: patent LIMA-LAD and SVG-PDA with 99% stenosis of SVG-RI - s/p DES placement  . Diabetes mellitus   . GERD (gastroesophageal reflux disease)   . Hard of hearing    has hearing aids /BIL  . HLD (hyperlipidemia)   . Hypertension   . Ischemic cardiomyopathy    a. eval by EP in 2011 - no ICD needed at that time;  b.  Echo 4/16: inf and inf-lat HK, EF  50-55%, Gr 1 DD;  c. LHC 4/16: EF 35-40%; d. 01/2014 Cardiac MRI: EF 42%, 2/3rd thickness mid and distal ant/septal/apical scar.  . Myocardial infarction ([place]) 1996  . PAD (peripheral artery disease) (HCC)    Dr. Oneida Alar in the past  . Thrombocytopenia ([place])      Allergies  Allergen Reactions  . Lipitor [Atorvastatin Calcium]     Myalgias      Current Outpatient Medications  Medication Sig Dispense Refill  . amLODipine (NORVASC) 10 MG tablet TAKE 1 TABLET BY MOUTH  DAILY 90 tablet 3  . aspirin 81 MG EC tablet Take 81 mg by mouth 2 (two) times a day.     . carvedilol (COREG) 6.25 MG tablet TAKE 1 TABLET BY MOUTH  TWICE DAILY WITH A MEAL 180 tablet 3  . fish oil-omega-3 fatty acids 1000 MG capsule Take 1 g by mouth 2 (two) times daily.     Marland Kitchen glimepiride (AMARYL) 2 MG tablet Take 4 mg by mouth daily before breakfast.     . isosorbide mononitrate (IMDUR) 30 MG 24 hr tablet TAKE 1 TABLET BY MOUTH  DAILY 90 tablet 3  . JENTADUETO XR 01-999 MG TB24 Take 1 tablet by mouth daily.     Marland Kitchen losartan (COZAAR) 100 MG tablet TAKE 1 TABLET BY MOUTH  DAILY 90 tablet 2  . nitroGLYCERIN (  NITROLINGUAL) 0.4 MG/SPRAY spray Place 1 spray under the tongue every 5 (five) minutes x 3 doses as needed for chest pain. 12 g 12  . rosuvastatin (CRESTOR) 10 MG tablet TAKE 1 TABLET BY MOUTH  DAILY 90 tablet 2  . zolpidem (AMBIEN) 10 MG tablet Take 1 tablet by mouth at bedtime.      No current facility-administered medications for this visit.     Past Surgical History:  Procedure Laterality Date  . CATARACT EXTRACTION W/PHACO Right 08/06/2013   Procedure: CATARACT EXTRACTION PHACO AND INTRAOCULAR LENS PLACEMENT RIGHT EYE;  Surgeon: Tonny Tjay Velazquez, MD;  Location: [facility];  Service: Ophthalmology;  Laterality: Right;  CDE:  15.48  . COLONOSCOPY    . COLONOSCOPY WITH PROPOFOL N/A 10/22/2013   Procedure: COLONOSCOPY WITH PROPOFOL;  Surgeon: Irene Shipper, MD;  [facility];  Service: Endoscopy;  Laterality: N/A;    . CORONARY ARTERY BYPASS GRAFT  1996   5 vessels  . CORONARY STENT INTERVENTION N/A 10/31/2017   Procedure: CORONARY STENT INTERVENTION;  Surgeon: Jettie Booze, MD;  Location: [place] CV LAB;  Service: Cardiovascular;  Laterality: N/A;  . HERNIA REPAIR     groin-25 yrs ago  . LEFT HEART CATH AND CORS/GRAFTS ANGIOGRAPHY N/A 10/31/2017   Procedure: LEFT HEART CATH AND CORS/GRAFTS ANGIOGRAPHY;  Surgeon: Jettie Booze, MD;  Location: [place] CV LAB;  Service: Cardiovascular;  Laterality: N/A;  . LEFT HEART CATHETERIZATION WITH CORONARY/GRAFT ANGIOGRAM N/A 01/06/2015   Procedure: LEFT HEART CATHETERIZATION WITH Beatrix Fetters;  Surgeon: Troy Sine, MD;  Location: [facility] CATH LAB;  Service: Cardiovascular;  Laterality: N/A;     Allergies  Allergen Reactions  . Lipitor [Atorvastatin Calcium]     Myalgias       Family History  Problem Relation Age of Onset  . Heart disease Mother   . Heart attack Mother   . Hypertension Mother   . Heart disease Father   . Heart attack Father   . Hypertension Father   . Cancer Brother   . Heart attack Sister   . Heart attack Brother   . Diabetes Brother   . Diabetes Sister   . Hypertension Sister   . Hypertension Brother   . Stroke Neg Hx      Social History Mr. Corpus reports that he quit smoking about 29 years ago. His smoking use included cigarettes. He started smoking about 62 years ago. He has a 30.00 pack-year smoking history. He has never used smokeless tobacco. Mr. Kump reports no history of alcohol use.   Review of Systems CONSTITUTIONAL: No weight loss, fever, chills, weakness or fatigue.  HEENT: Eyes: No visual loss, blurred vision, double vision or yellow sclerae.No hearing loss, sneezing, congestion, runny nose or sore throat.  SKIN: No rash or itching.  CARDIOVASCULAR:  RESPIRATORY: No shortness of breath, cough or sputum.  GASTROINTESTINAL: No anorexia, nausea, vomiting or diarrhea. No  abdominal pain or blood.  GENITOURINARY: No burning on urination, no polyuria NEUROLOGICAL: No headache, dizziness, syncope, paralysis, ataxia, numbness or tingling in the extremities. No change in bowel or bladder control.  MUSCULOSKELETAL: No muscle, back pain, joint pain or stiffness.  LYMPHATICS: No enlarged nodes. No history of splenectomy.  PSYCHIATRIC: No history of depression or anxiety.  ENDOCRINOLOGIC: No reports of sweating, cold or heat intolerance. No polyuria or polydipsia.  Marland Kitchen   Physical Examination There were no vitals filed for this visit. There were no vitals filed for this visit.  Gen: resting  comfortably, no acute distress HEENT: no scleral icterus, pupils equal round and reactive, no palptable cervical adenopathy,  CV Resp: Clear to auscultation bilaterally GI: abdomen is soft, non-tender, non-distended, normal bowel sounds, no hepatosplenomegaly MSK: extremities are warm, no edema.  Skin: warm, no rash Neuro:  no focal deficits Psych: appropriate affect   Diagnostic Studies  12/2014 Cath  HEMODYNAMICS:  Central Aorta: 170/59  Left Ventricle: 170/19  ANGIOGRAPHY:  Left main: Normal vessel which bifurcated into the LAD and left circumflex coronary artery  LAD: Moderate size proximal vessel that was totally occluded after the first septal and diagonal vessel.  Left circumflex: good size vessel that had 70% mid stenosis before a small marginal Ceria Suminski. The distal vessel was small caliber and there was retrograde filling of a previous sequential graft most likely between the small marginal and distal circumflex.  Right coronary artery: Moderate size vessel with calcification and 80% proximal stenosis with a small area of probable thrombus in its midsegment prior to total mid occlusion.  LIMA to LAD: Widely patent and anastomosed into the mid LAD. There is retrograde filling to the proximal occlusion. The anastomosis was free of significant disease  extended to the apex.  SVG sequential graft supplying a marginal and distal circumflex was totally occluded at its origin.  SVG E high marginal or ramus intermediate vessel was widely patent. There was mild 20% proximal smooth narrowing.   Left ventriculography revealed global LV dysfunction with an ejection fraction of 35 to less than 40%.   IMPRESSION:  Ischemic cardiomyopathy with an ejection fraction of 35 to less than 40% with diffuse global hypocontractility.  Significant native coronary obstructive disease with total occlusion of the proximal LAD after the septal diagonal vessel; 70% stenosis in the proximal AV groove circumflex with occlusion of the native OM Jayce Kainz and retrograde filling of the sequential limb distally to this mid Jakobee Brackins and total occlusion of the mid right coronary artery with 80% calcified proximal stenosis and small area of thrombus in the mid RCA prior to the total occlusion.  Occluded vein graft which most likely was the sequential graft supplying an OM and distal circumflex vessel the circumflex coronary artery.  Patent SVG which seems to supply a high marginal or ramus intermediate-like vessel this 20% proximal narrowing in the graft.  Patent SVG to the PDA Maria Coin of the RCA.  Patent LIMA graft to the mid LAD.  POST CATHRECOMMENDATION:  The patient has an occluded graft which most likely sequentially supplied a small OM and distal circumflex vessel. 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 Platelets today are low at 81,000. He has not been on any significant antianginal regimen. Initial medical therapy will be recommended with close follow-up of his platelets. If recurrent symptomatology develops consider possible PCI to the native circumflex   10/2017 cath  Ost Cx to Mid Cx lesion is 75% stenosed and appears unchanged from prior. THe SVG to circ is occluded  Prox LAD lesion is 100% stenosed. LIMA to LAD is patent.  Mid  RCA lesion is 100% stenosed. SVG to PDA is patent.  Ramus-1 lesion is 100% stenosed. SVG to ramus is patent proximally. Dist Graft lesion is 99% stenosed.  A drug-eluting stent was successfully placed using a STENT SYNERGY DES 3X24, after aspiration thrombectomy.  Post intervention, there is a 0% residual stenosis.  Ramus-2 lesion is 80% stenosed. Balloon angioplasty was performed using a BALLOON SAPPHIRE 2.0X15.  Post intervention, there is a 10% residual stenosis.  LV end diastolic pressure is mildly elevated.  There is no aortic valve stenosis.  Continue IV angiomax for 1 hour post procedure.  Continue aspirin and Brilinta for 1 year along with aggressive secondary prevention.   01/2018 echo Study Conclusions  - Left ventricle: The cavity size was mildly dilated. Wall thickness was normal. Systolic function was mildly to moderately reduced. The estimated ejection fraction was in the range of 40% to 45%. Doppler parameters are consistent with abnormal left ventricular relaxation (grade 1 diastolic dysfunction). Doppler parameters are consistent with high ventricular filling pressure. - Regional wall motion abnormality: Hypokinesis of the apical anterior, apical inferior, apical septal, and apical myocardium. - Aortic valve: Moderately calcified annulus. Trileaflet. - Mitral valve: Calcified annulus.  Recommendations: The LV apex was suboptimally visualized. Consider contrast enhancement in the future.   Assessment and Plan   1. CAD -doing well without symptoms, continue current meds  2. HTN -at goal, continue current meds  3. Hyperlipidemia -continue statin, request labs from pcp     [name], M.D.

## 2020-01-11 NOTE — Progress Notes (Addendum)
Cardiology Office Note  Date: 01/14/2020   ID: Troy [name], DOB 05/16/1941, MRN [id number]  PCP:  Reynold Bowen, MD  Cardiologist:  Carlyle Dolly, MD Electrophysiologist:  None   Chief Complaint: F/U CAD, HTN, HLD, ICM, PAD  History of Present Illness: Troy [name] is a 79 y.o. male with a history of CAD (CABG 1996), HTN, HLD.  Cath 2016; occluded LAD, LCx 70%, RCA prox. 80%, LIMA-LAD patent, SVG-OM occluded, SVG-ramus patent. EF 35-40%. Recs. if symptoms on medical therapy can consider LCX intervention   Echo 2016 EF 50-55% Grade I DD.  NSTEMI 2019 DES to SVG-Ramus Echo 01/2018 EF 40-45 % Grade I DD, apical hypokinesis SOB on Brillinta. Changed to Plavix. Plavix was stopped 1 year after DES placed.  Last Dr. Harl Bowie on 06/20/2019.  He had no recent chest pain ,new shortness of breath, or DOE, and was compliant with his medication.  Had some whitecoat hypertension in the past.  He was compliant with antihypertensive medications.  Last lipid level August 2019 TC 96, TG 88, HDL 31, LDL 47.  Was tolerating Crestor.  Patient states he has been doing well in the interim since last visit.  He denies any progressive anginal or exertional symptoms.  He states he does have some exertional fatigue.  However he states if he rests for 15 to 20 minutes he can resume his normal activity.  He has been working outside and working on his boat without any issues.  States his recent hemoglobin A1c was 6.2.  He has lost a fair amount of weight and has been able to transition from insulin to oral therapy.  He denies any CVA or TIA-like symptoms, orthostatic symptoms, palpitations or arrhythmias, bleeding in stool or urine.  Claudication-like symptoms, DVT or PE-like symptoms, or lower extremity edema.  States he does suffer from peripheral neuropathy in both lower extremities. Otherwise he has had no recent issues . Past Medical History:  Diagnosis Date  . CAD (coronary artery disease)    a. 1996  s/p CABG x 5 (LIMA->LAD, VG->OM->dLCX, VG->RI, VG->RPDA);  b.  Nuclear 2/11:  EF 34%,  large anterior and apical scar. There is mild peri-infarct ischemia;  c.  LHC 4/16:  LAD occld, LCx mid 70%, OM branch occl, RCA 80% prox, occl mid, L-LAD ok, S-OM/dCFX 100%, S-RI ok, S-RPDA ok, EF 35-40% >> med Rx d. 10/2017: patent LIMA-LAD and SVG-PDA with 99% stenosis of SVG-RI - s/p DES placement  . Diabetes mellitus   . GERD (gastroesophageal reflux disease)   . Hard of hearing    has hearing aids /BIL  . HLD (hyperlipidemia)   . Hypertension   . Ischemic cardiomyopathy    a. eval by EP in 2011 - no ICD needed at that time;  b.  Echo 4/16: inf and inf-lat HK, EF 50-55%, Gr 1 DD;  c. LHC 4/16: EF 35-40%; d. 01/2014 Cardiac MRI: EF 42%, 2/3rd thickness mid and distal ant/septal/apical scar.  . Myocardial infarction ([place]) 1996  . PAD (peripheral artery disease) (HCC)    Dr. Oneida Alar in the past  . Thrombocytopenia [facility])     Past Surgical History:  Procedure Laterality Date  . CATARACT EXTRACTION W/PHACO Right 08/06/2013   Procedure: CATARACT EXTRACTION PHACO AND INTRAOCULAR LENS PLACEMENT RIGHT EYE;  Surgeon: Tonny Branch, MD;  Location: [facility];  Service: Ophthalmology;  Laterality: Right;  CDE:  15.48  . COLONOSCOPY    . COLONOSCOPY WITH PROPOFOL N/A 10/22/2013   Procedure: COLONOSCOPY WITH  PROPOFOL;  Surgeon: Irene Shipper, MD;  Location: [facility];  Service: Endoscopy;  Laterality: N/A;  . CORONARY ARTERY BYPASS GRAFT  1996   5 vessels  . CORONARY STENT INTERVENTION N/A 10/31/2017   Procedure: CORONARY STENT INTERVENTION;  Surgeon: Jettie Booze, MD;  Location: [place] CV LAB;  Service: Cardiovascular;  Laterality: N/A;  . HERNIA REPAIR     groin-25 yrs ago  . LEFT HEART CATH AND CORS/GRAFTS ANGIOGRAPHY N/A 10/31/2017   Procedure: LEFT HEART CATH AND CORS/GRAFTS ANGIOGRAPHY;  Surgeon: Jettie Booze, MD;  Location: [place] CV LAB;  Service: Cardiovascular;  Laterality: N/A;  . LEFT  HEART CATHETERIZATION WITH CORONARY/GRAFT ANGIOGRAM N/A 01/06/2015   Procedure: LEFT HEART CATHETERIZATION WITH Beatrix Fetters;  Surgeon: Troy Sine, MD;  Location: [facility] CATH LAB;  Service: Cardiovascular;  Laterality: N/A;    Current Outpatient Medications  Medication Sig Dispense Refill  . amLODipine (NORVASC) 10 MG tablet TAKE 1 TABLET BY MOUTH  DAILY 90 tablet 3  . aspirin 81 MG EC tablet Take 81 mg by mouth 2 (two) times a day.     . carvedilol (COREG) 6.25 MG tablet TAKE 1 TABLET BY MOUTH  TWICE DAILY WITH A MEAL 180 tablet 3  . fish oil-omega-3 fatty acids 1000 MG capsule Take 1 g by mouth 2 (two) times daily.     Marland Kitchen glimepiride (AMARYL) 2 MG tablet Take 2 mg by mouth 2 (two) times daily.     . isosorbide mononitrate (IMDUR) 30 MG 24 hr tablet TAKE 1 TABLET BY MOUTH  DAILY 90 tablet 3  . JENTADUETO XR 01-999 MG TB24 Take 1 tablet by mouth daily.     Marland Kitchen losartan (COZAAR) 100 MG tablet TAKE 1 TABLET BY MOUTH  DAILY 90 tablet 2  . rosuvastatin (CRESTOR) 10 MG tablet TAKE 1 TABLET BY MOUTH  DAILY 90 tablet 2  . zolpidem (AMBIEN) 10 MG tablet Take 1 tablet by mouth at bedtime.      No current facility-administered medications for this visit.   Allergies:  Lipitor [atorvastatin calcium]   Social History: The patient  reports that he quit smoking about 29 years ago. His smoking use included cigarettes. He started smoking about 62 years ago. He has a 30.00 pack-year smoking history. He has never used smokeless tobacco. He reports that he does not drink alcohol or use drugs.   Family History: The patient's family history includes Cancer in his brother; Diabetes in his brother and sister; Heart attack in his brother, father, mother, and sister; Heart disease in his father and mother; Hypertension in his brother, father, mother, and sister.   ROS:  Please see the history of present illness. Otherwise, complete review of systems is positive for none.  All other systems are reviewed and  negative.   Physical Exam: VS:  BP (!) 122/50   Pulse 63   Ht 5\' 9"  (1.753 m)   Wt 208 lb 12.8 oz (94.7 kg)   SpO2 95%   BMI 30.83 kg/m , BMI Body mass index is 30.83 kg/m.  Wt Readings from Last 3 Encounters:  01/14/20 208 lb 12.8 oz (94.7 kg)  06/20/19 205 lb (93 kg)  02/12/19 200 lb (90.7 kg)    General: Patient appears comfortable at rest. Neck: Supple, no elevated JVP or carotid bruits, no thyromegaly. Lungs: Clear to auscultation, nonlabored breathing at rest. Cardiac: Regular rate and rhythm, no S3 or significant systolic murmur, no pericardial rub. Extremities: No pitting edema, distal  pulses 2+. Skin: Warm and dry. Musculoskeletal: No kyphosis. Neuropsychiatric: Alert and oriented x3, affect grossly appropriate.  ECG:  An ECG dated 01/14/2020 was personally reviewed today and demonstrated:  Sinus rhythm first-degree AV block, frequent ectopic ventricular beats, anterior infarct probably recent  Recent Labwork: No results found for requested labs within last 8760 hours.     Component Value Date/Time   CHOL 109 10/31/2017 0302   TRIG 115 10/31/2017 0302   HDL 32 (L) 10/31/2017 0302   CHOLHDL 3.4 10/31/2017 0302   VLDL 23 10/31/2017 0302   LDLCALC 54 10/31/2017 0302    Other Studies Reviewed Today: 12/2014 Cath  HEMODYNAMICS:  Central Aorta: 170/59  Left Ventricle: 170/19  ANGIOGRAPHY:  Left main: Normal vessel which bifurcated into the LAD and left circumflex coronary artery  LAD: Moderate size proximal vessel that was totally occluded after the first septal and diagonal vessel.  Left circumflex: good size vessel that had 70% mid stenosis before a small marginal branch. The distal vessel was small caliber and there was retrograde filling of a previous sequential graft most likely between the small marginal and distal circumflex.  Right coronary artery: Moderate size vessel with calcification and 80% proximal stenosis with a small area of  probable thrombus in its midsegment prior to total mid occlusion.  LIMA to LAD: Widely patent and anastomosed into the mid LAD. There is retrograde filling to the proximal occlusion. The anastomosis was free of significant disease extended to the apex.  SVG sequential graft supplying a marginal and distal circumflex was totally occluded at its origin.  SVG E high marginal or ramus intermediate vessel was widely patent. There was mild 20% proximal smooth narrowing.   Left ventriculography revealed global LV dysfunction with an ejection fraction of 35 to less than 40%.   IMPRESSION:  Ischemic cardiomyopathy with an ejection fraction of 35 to less than 40% with diffuse global hypocontractility.  Significant native coronary obstructive disease with total occlusion of the proximal LAD after the septal diagonal vessel; 70% stenosis in the proximal AV groove circumflex with occlusion of the native OM branch and retrograde filling of the sequential limb distally to this mid branch and total occlusion of the mid right coronary artery with 80% calcified proximal stenosis and small area of thrombus in the mid RCA prior to the total occlusion.  Occluded vein graft which most likely was the sequential graft supplying an OM and distal circumflex vessel the circumflex coronary artery.  Patent SVG which seems to supply a high marginal or ramus intermediate-like vessel this 20% proximal narrowing in the graft.  Patent SVG to the PDA branch of the RCA.  Patent LIMA graft to the mid LAD.  POST CATHRECOMMENDATION:  The patient has an occluded graft which most likely sequentially supplied a small OM and distal circumflex vessel. 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 Platelets today are low at 81,000. He has not been on any significant antianginal regimen. Initial medical therapy will be recommended with close follow-up of his platelets. If recurrent symptomatology develops  consider possible PCI to the native circumflex   10/2017 cath  Ost Cx to Mid Cx lesion is 75% stenosed and appears unchanged from prior. THe SVG to circ is occluded  Prox LAD lesion is 100% stenosed. LIMA to LAD is patent.  Mid RCA lesion is 100% stenosed. SVG to PDA is patent.  Ramus-1 lesion is 100% stenosed. SVG to ramus is patent proximally. Dist Graft lesion is 99% stenosed.  A drug-eluting stent was successfully placed using a STENT SYNERGY DES 3X24, after aspiration thrombectomy.  Post intervention, there is a 0% residual stenosis.  Ramus-2 lesion is 80% stenosed. Balloon angioplasty was performed using a BALLOON SAPPHIRE 2.0X15.  Post intervention, there is a 10% residual stenosis.  LV end diastolic pressure is mildly elevated.  There is no aortic valve stenosis.  Continue IV angiomax for 1 hour post procedure.  Continue aspirin and Brilinta for 1 year along with aggressive secondary prevention.   01/2018 echo Study Conclusions  - Left ventricle: The cavity size was mildly dilated. Wall thickness was normal. Systolic function was mildly to moderately reduced. The estimated ejection fraction was in the range of 40% to 45%. Doppler parameters are consistent with abnormal left ventricular relaxation (grade 1 diastolic dysfunction). Doppler parameters are consistent with high ventricular filling pressure. - Regional wall motion abnormality: Hypokinesis of the apical anterior, apical inferior, apical septal, and apical myocardium. - Aortic valve: Moderately calcified annulus. Trileaflet. - Mitral valve: Calcified annulus.  Recommendations: The LV apex was suboptimally visualized. Consider contrast enhancement  Assessment and Plan:  1. CAD in native artery   2. Essential hypertension   3. Mixed hyperlipidemia    1. CAD in native artery Patient denies any recent progressive anginal or exertional symptoms but does have some occasional  exertional fatigue.  States he usually rests for about 15 minutes then resumes normal activity.  Continue aspirin 81 mg, Imdur 30 mg, carvedilol 6.25 mg p.o. twice daily.  2. Essential hypertension Blood pressure is well controlled on current medications.  Blood pressure today is 122/50.  Heart rate is 63.  Continue amlodipine 10 mg, losartan 100 mg daily.  3. Mixed hyperlipidemia Patient states he recently had lab work at primary care office and lipid levels were normal as far as he knows.  Last LDL in 04/2019 49 at PCP.  Continue Crestor 10 mg daily.    Medication Adjustments/Labs and Tests Ordered: Current medicines are reviewed at length with the patient today.  Concerns regarding medicines are outlined above.   Disposition: Follow-up with Dr. Harl Bowie or APP 6 months.  Signed, Levell July, NP 01/14/2020 9:30 AM    [facility] [place], [place], [place] [address] Phone: [phone]; Fax: [phone]

## 2020-01-14 ENCOUNTER — Encounter: Payer: Self-pay | Admitting: Family Medicine

## 2020-01-14 ENCOUNTER — Ambulatory Visit (INDEPENDENT_AMBULATORY_CARE_PROVIDER_SITE_OTHER): Payer: Medicare Other | Admitting: Family Medicine

## 2020-01-14 ENCOUNTER — Encounter: Payer: Self-pay | Admitting: *Deleted

## 2020-01-14 ENCOUNTER — Other Ambulatory Visit: Payer: Self-pay

## 2020-01-14 VITALS — BP 122/50 | HR 63 | Ht 69.0 in | Wt 208.8 lb

## 2020-01-14 DIAGNOSIS — I10 Essential (primary) hypertension: Secondary | ICD-10-CM

## 2020-01-14 DIAGNOSIS — E782 Mixed hyperlipidemia: Secondary | ICD-10-CM

## 2020-01-14 DIAGNOSIS — I2510 Atherosclerotic heart disease of native coronary artery without angina pectoris: Secondary | ICD-10-CM

## 2020-01-14 NOTE — Patient Instructions (Addendum)

## 2020-08-06 ENCOUNTER — Other Ambulatory Visit: Payer: Self-pay | Admitting: Cardiology

## 2020-09-18 ENCOUNTER — Encounter: Payer: Self-pay | Admitting: Cardiology

## 2020-09-18 ENCOUNTER — Encounter: Payer: Self-pay | Admitting: *Deleted

## 2020-09-18 ENCOUNTER — Ambulatory Visit: Payer: Medicare Other | Admitting: Cardiology

## 2020-09-18 VITALS — BP 154/70 | HR 55 | Ht 69.0 in | Wt 212.0 lb

## 2020-09-18 DIAGNOSIS — I10 Essential (primary) hypertension: Secondary | ICD-10-CM

## 2020-09-18 DIAGNOSIS — I2510 Atherosclerotic heart disease of native coronary artery without angina pectoris: Secondary | ICD-10-CM

## 2020-09-18 DIAGNOSIS — I255 Ischemic cardiomyopathy: Secondary | ICD-10-CM | POA: Diagnosis not present

## 2020-09-18 NOTE — Progress Notes (Signed)
Clinical Summary Troy [name] is a 79 y.o.male seen today for follow up of the following medical problems.   1. CAD - hx of CABG in 1996 - cath 12/2014 showed patent LM, LAD occluded, LCX 70% mid, RCA 80% prox. LIMA-LAD patent SVG-OM occluded, SVG-ramus patent. LVEF 35-40%. Managed medically, with recs if symptoms on medical therapy can consider LCX intervetntion - 12/2014 echo LVEF 99991111, grade I diastolic dysfunction   - admit 10/2017 with NTEMI, peak trop 0.3. Cath as reported below, receied DES to SVG-ramus - 01/2018 ech LVEF [id number], grade I diastolic dysfunction apical hypokinesis.  - SOB on brillinta, was changed to plavix. Plavix stopped 10/2018 after completed 1 year of therapy.   - no recent chest pain, no sob or doe - compliant with meds  2. HTN - has had some white coat HTN in the past  - home bp's 130s/60s - he is compliant ith meds  3. 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 Has pontoon boat, enjoys fishing.Spending his time currently at the beach, isolating himself from COVID-19 risks  Has had covid vaccine, awaiting vaccine.  Past Medical History:  Diagnosis Date  . CAD (coronary artery disease)    a. 1996 s/p CABG x 5 (LIMA->LAD, VG->OM->dLCX, VG->RI, VG->RPDA);  b.  Nuclear 2/11:  EF 34%,  large anterior and apical scar. There is mild peri-infarct ischemia;  c.  LHC 4/16:  LAD occld, LCx mid 70%, OM Troy [name] occl, RCA 80% prox, occl mid, L-LAD ok, S-OM/dCFX 100%, S-RI ok, S-RPDA ok, EF 35-40% >> med Rx d. 10/2017: patent LIMA-LAD and SVG-PDA with 99% stenosis of SVG-RI - s/p DES placement  . Diabetes mellitus   . GERD (gastroesophageal reflux disease)   . Hard of hearing    has hearing aids /BIL  . HLD (hyperlipidemia)   . Hypertension   . Ischemic cardiomyopathy    a. eval by EP in 2011 - no ICD  needed at that time;  b.  Echo 4/16: inf and inf-lat HK, EF 50-55%, Gr 1 DD;  c. LHC 4/16: EF 35-40%; d. 01/2014 Cardiac MRI: EF 42%, 2/3rd thickness mid and distal ant/septal/apical scar.  . Myocardial infarction ([place]) 1996  . PAD (peripheral artery disease) (HCC)    Dr. Oneida Alar in the past  . Thrombocytopenia ([place])      Allergies  Allergen Reactions  . Lipitor [Atorvastatin Calcium]     Myalgias      Current Outpatient Medications  Medication Sig Dispense Refill  . amLODipine (NORVASC) 10 MG tablet TAKE 1 TABLET BY MOUTH  DAILY 90 tablet 3  . aspirin 81 MG EC tablet Take 81 mg by mouth 2 (two) times a day.     . carvedilol (COREG) 6.25 MG tablet TAKE 1 TABLET BY MOUTH  TWICE DAILY WITH A MEAL 180 tablet 3  . fish oil-omega-3 fatty acids 1000 MG capsule Take 1 g by mouth 2 (two) times daily.     Troy [name] Kitchen glimepiride (AMARYL) 2 MG tablet Take 2 mg by mouth 2 (two) times daily.     . isosorbide mononitrate (IMDUR) 30 MG 24 hr tablet TAKE 1 TABLET BY MOUTH  DAILY 90 tablet 3  . JENTADUETO XR 01-999 MG TB24 Take 1 tablet by mouth daily.     Troy [name] Kitchen losartan (COZAAR) 100 MG tablet TAKE 1 TABLET  BY MOUTH  DAILY 30 tablet 0  . rosuvastatin (CRESTOR) 10 MG tablet TAKE 1 TABLET BY MOUTH  DAILY 30 tablet 0  . zolpidem (AMBIEN) 10 MG tablet Take 1 tablet by mouth at bedtime.      No current facility-administered medications for this visit.     Past Surgical History:  Procedure Laterality Date  . CATARACT EXTRACTION W/PHACO Right 08/06/2013   Procedure: CATARACT EXTRACTION PHACO AND INTRAOCULAR LENS PLACEMENT RIGHT EYE;  Surgeon: Tonny Demarie Hyneman, MD;  Location: [facility];  Service: Ophthalmology;  Laterality: Right;  CDE:  15.48  . COLONOSCOPY    . COLONOSCOPY WITH PROPOFOL N/A 10/22/2013   Procedure: COLONOSCOPY WITH PROPOFOL;  Surgeon: Irene Shipper, MD;  Location: [facility];  Service: Endoscopy;  Laterality: N/A;  . CORONARY ARTERY BYPASS GRAFT  1996   5 vessels  . CORONARY STENT INTERVENTION N/A  10/31/2017   Procedure: CORONARY STENT INTERVENTION;  Surgeon: Jettie Booze, MD;  Location: [place] CV LAB;  Service: Cardiovascular;  Laterality: N/A;  . HERNIA REPAIR     groin-25 yrs ago  . LEFT HEART CATH AND CORS/GRAFTS ANGIOGRAPHY N/A 10/31/2017   Procedure: LEFT HEART CATH AND CORS/GRAFTS ANGIOGRAPHY;  Surgeon: Jettie Booze, MD;  Location: [place] CV LAB;  Service: Cardiovascular;  Laterality: N/A;  . LEFT HEART CATHETERIZATION WITH CORONARY/GRAFT ANGIOGRAM N/A 01/06/2015   Procedure: LEFT HEART CATHETERIZATION WITH Beatrix Fetters;  Surgeon: Troy Sine, MD;  Location: [facility] CATH LAB;  Service: Cardiovascular;  Laterality: N/A;     Allergies  Allergen Reactions  . Lipitor [Atorvastatin Calcium]     Myalgias       Family History  Problem Relation Age of Onset  . Heart disease Mother   . Heart attack Mother   . Hypertension Mother   . Heart disease Father   . Heart attack Father   . Hypertension Father   . Cancer Brother   . Heart attack Sister   . Heart attack Brother   . Diabetes Brother   . Diabetes Sister   . Hypertension Sister   . Hypertension Brother   . Stroke Neg Hx      Social History [name] reports that he quit smoking about 29 years ago. His smoking use included cigarettes. He started smoking about 63 years ago. He has a 30.00 pack-year smoking history. He has never used smokeless tobacco. [name] reports no history of alcohol use.   Review of Systems CONSTITUTIONAL: No weight loss, fever, chills, weakness or fatigue.  HEENT: Eyes: No visual loss, blurred vision, double vision or yellow sclerae.No hearing loss, sneezing, congestion, runny nose or sore throat.  SKIN: No rash or itching.  CARDIOVASCULAR: per hpi RESPIRATORY: No shortness of breath, cough or sputum.  GASTROINTESTINAL: No anorexia, nausea, vomiting or diarrhea. No abdominal pain or blood.  GENITOURINARY: No burning on urination, no  polyuria NEUROLOGICAL: No headache, dizziness, syncope, paralysis, ataxia, numbness or tingling in the extremities. No change in bowel or bladder control.  MUSCULOSKELETAL: No muscle, back pain, joint pain or stiffness.  LYMPHATICS: No enlarged nodes. No history of splenectomy.  PSYCHIATRIC: No history of depression or anxiety.  ENDOCRINOLOGIC: No reports of sweating, cold or heat intolerance. No polyuria or polydipsia.  Troy [name] Kitchen   Physical Examination Today's Vitals   09/18/20 1559  BP: (!) 154/70  Pulse: (!) 55  SpO2: 98%  Weight: 212 lb (96.2 kg)  Height: 5\' 9"  (1.753 m)   Body mass index is 31.31 kg/m.  Gen: resting comfortably, no acute distress HEENT: no scleral icterus, pupils equal round and reactive, no palptable cervical adenopathy,  CV: RRR, no m/r/g no jvd Resp: Clear to auscultation bilaterally GI: abdomen is soft, non-tender, non-distended, normal bowel sounds, no hepatosplenomegaly MSK: extremities are warm, no edema.  Skin: warm, no rash Neuro:  no focal deficits Psych: appropriate affect   Diagnostic Studies  12/2014 Cath  HEMODYNAMICS:  Central Aorta: 170/59  Left Ventricle: 170/19  ANGIOGRAPHY:  Left main: Normal vessel which bifurcated into the LAD and left circumflex coronary artery  LAD: Moderate size proximal vessel that was totally occluded after the first septal and diagonal vessel.  Left circumflex: good size vessel that had 70% mid stenosis before a small marginal Troy [name]. The distal vessel was small caliber and there was retrograde filling of a previous sequential graft most likely between the small marginal and distal circumflex.  Right coronary artery: Moderate size vessel with calcification and 80% proximal stenosis with a small area of probable thrombus in its midsegment prior to total mid occlusion.  LIMA to LAD: Widely patent and anastomosed into the mid LAD. There is retrograde filling to the proximal occlusion. The anastomosis  was free of significant disease extended to the apex.  SVG sequential graft supplying a marginal and distal circumflex was totally occluded at its origin.  SVG E high marginal or ramus intermediate vessel was widely patent. There was mild 20% proximal smooth narrowing.   Left ventriculography revealed global LV dysfunction with an ejection fraction of 35 to less than 40%.   IMPRESSION:  Ischemic cardiomyopathy with an ejection fraction of 35 to less than 40% with diffuse global hypocontractility.  Significant native coronary obstructive disease with total occlusion of the proximal LAD after the septal diagonal vessel; 70% stenosis in the proximal AV groove circumflex with occlusion of the native OM Troy [name] and retrograde filling of the sequential limb distally to this mid Troy [name] and total occlusion of the mid right coronary artery with 80% calcified proximal stenosis and small area of thrombus in the mid RCA prior to the total occlusion.  Occluded vein graft which most likely was the sequential graft supplying an OM and distal circumflex vessel the circumflex coronary artery.  Patent SVG which seems to supply a high marginal or ramus intermediate-like vessel this 20% proximal narrowing in the graft.  Patent SVG to the PDA Troy [name] of the RCA.  Patent LIMA graft to the mid LAD.  POST CATHRECOMMENDATION:  The patient has an occluded graft which most likely sequentially supplied a small OM and distal circumflex vessel. 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 Platelets today are low at 81,000. He has not been on any significant antianginal regimen. Initial medical therapy will be recommended with close follow-up of his platelets. If recurrent symptomatology develops consider possible PCI to the native circumflex   10/2017 cath  Ost Cx to Mid Cx lesion is 75% stenosed and appears unchanged from prior. THe SVG to circ is occluded  Prox LAD lesion is 100% stenosed.  LIMA to LAD is patent.  Mid RCA lesion is 100% stenosed. SVG to PDA is patent.  Ramus-1 lesion is 100% stenosed. SVG to ramus is patent proximally. Dist Graft lesion is 99% stenosed.  A drug-eluting stent was successfully placed using a STENT SYNERGY DES 3X24, after aspiration thrombectomy.  Post intervention, there is a 0% residual stenosis.  Ramus-2 lesion is 80% stenosed. Balloon angioplasty was performed using a BALLOON SAPPHIRE 2.0X15.  Post intervention,  there is a 10% residual stenosis.  LV end diastolic pressure is mildly elevated.  There is no aortic valve stenosis.  Continue IV angiomax for 1 hour post procedure.  Continue aspirin and Brilinta for 1 year along with aggressive secondary prevention.   01/2018 echo Study Conclusions  - Left ventricle: The cavity size was mildly dilated. Wall thickness was normal. Systolic function was mildly to moderately reduced. The estimated ejection fraction was in the range of 40% to 45%. Doppler parameters are consistent with abnormal left ventricular relaxation (grade 1 diastolic dysfunction). Doppler parameters are consistent with high ventricular filling pressure. - Regional wall motion abnormality: Hypokinesis of the apical anterior, apical inferior, apical septal, and apical myocardium. - Aortic valve: Moderately calcified annulus. Trileaflet. - Mitral valve: Calcified annulus.  Recommendations: The LV apex was suboptimally visualized. Consider contrast enhancement in the future.   Assessment and Plan   1. CAD/ICM - no recent symptoms, continue current meds - repeat echo to reassess LV function, if remains decreased may need further medication titration  2. HTN - clinic bp elevated but home bp's at goal, continue current meds  3. Hyperlipidemia -request pcp labs, continue statin    [name], M.D.

## 2020-09-18 NOTE — Patient Instructions (Signed)
Your physician wants you to follow-up in: 6 MONTHS WITH DR. BRANCH You will receive a reminder letter in the mail two months in advance. If you don't receive a letter, please call our office to schedule the follow-up appointment.  Your physician recommends that you continue on your current medications as directed. Please refer to the Current Medication list given to you today.  Your physician has requested that you have an echocardiogram. Echocardiography is a painless test that uses sound waves to create images of your heart. It provides your doctor with information about the size and shape of your heart and how well your heart's chambers and valves are working. This procedure takes approximately one hour. There are no restrictions for this procedure.  Thank you for choosing [facility]!!   

## 2020-09-19 ENCOUNTER — Other Ambulatory Visit: Payer: Self-pay | Admitting: Cardiology

## 2020-10-05 ENCOUNTER — Other Ambulatory Visit: Payer: Self-pay | Admitting: Cardiology

## 2020-10-09 ENCOUNTER — Ambulatory Visit (INDEPENDENT_AMBULATORY_CARE_PROVIDER_SITE_OTHER): Payer: Medicare Other

## 2020-10-09 DIAGNOSIS — I2510 Atherosclerotic heart disease of native coronary artery without angina pectoris: Secondary | ICD-10-CM

## 2020-10-09 LAB — ECHOCARDIOGRAM COMPLETE
Area-P 1/2: 3.12 cm2
Calc EF: 43.5 %
S' Lateral: 4.04 cm
Single Plane A2C EF: 43.1 %
Single Plane A4C EF: 42.1 %

## 2020-10-13 ENCOUNTER — Telehealth: Payer: Self-pay | Admitting: *Deleted

## 2020-10-13 DIAGNOSIS — I513 Intracardiac thrombosis, not elsewhere classified: Secondary | ICD-10-CM

## 2020-10-13 NOTE — Telephone Encounter (Signed)
-----   Message from [name], MD sent at 10/13/2020 12:54 PM EST ----- Echo shows heart function remains mildly decreased, but overlal stable from last study. There is one area of the heart that was poorly visualized, this area can very commonly develop clots in it and would like to get a limited echo with contrast to clarify there is no evidence of clot. Can we order limited echo with contrast for chronic systolic heart failure, exclude apical thrombus  Zandra Abts MD

## 2020-10-20 ENCOUNTER — Telehealth: Payer: Self-pay | Admitting: Cardiology

## 2020-10-20 NOTE — Telephone Encounter (Signed)
Pt voiced understanding and agreeable to limited echo - orders placed and will forward to schedulers

## 2020-10-20 NOTE — Telephone Encounter (Signed)
Pre-cert Verification for the following procedure    LIMITED ECHO WITH CONTRAST   DATE:10/24/2020  LOCATION:[facility]

## 2020-10-24 ENCOUNTER — Other Ambulatory Visit: Payer: Self-pay

## 2020-10-24 ENCOUNTER — Ambulatory Visit (HOSPITAL_COMMUNITY)
Admission: RE | Admit: 2020-10-24 | Discharge: 2020-10-24 | Disposition: A | Discharge status: Home / Self Care | Payer: Medicare Other | Source: Ambulatory Visit | Attending: Cardiology | Admitting: Cardiology

## 2020-10-24 DIAGNOSIS — I513 Intracardiac thrombosis, not elsewhere classified: Secondary | ICD-10-CM

## 2020-10-24 MED ORDER — PERFLUTREN LIPID MICROSPHERE
1.0000 mL | INTRAVENOUS | Status: AC | PRN
  Administered 2020-10-24: 2 mL via INTRAVENOUS
  Filled 2020-10-24: qty 10

## 2020-10-24 NOTE — Progress Notes (Signed)
*  PRELIMINARY RESULTS* Echocardiogram Limited 2-D echocardiogram has been performed with Definity.  Samuel Germany 10/24/2020, 1:51 PM

## 2020-10-31 ENCOUNTER — Telehealth: Payer: Self-pay | Admitting: Cardiology

## 2020-10-31 NOTE — Telephone Encounter (Signed)
Patient called requesting recent results of echo. Please call [phone]

## 2020-10-31 NOTE — Telephone Encounter (Signed)
Limited echo done 1/28

## 2020-11-03 NOTE — Telephone Encounter (Signed)
Echo shows heart function is decreased as mentioned in prior echo, fortuatnely there is no evidence of any blood clots in the heart, this study gave a much clearer view looking for any clots   J BrancH MD

## 2020-11-03 NOTE — Telephone Encounter (Signed)
Pt voiced understanding

## 2020-11-20 ENCOUNTER — Telehealth: Payer: Self-pay | Admitting: Cardiology

## 2020-11-20 NOTE — Telephone Encounter (Signed)
Pt c/o of Chest Pain: STAT if CP now or developed within 24 hours  1. Are you having CP right now? Slight discomfort in chest   2. Are you experiencing any other symptoms (ex. SOB, nausea, vomiting, sweating)? no 3. How long have you been experiencing CP? Since Satuirday  4. Is your CP continuous or coming and going? Comes and goes started Saturday   5. Have you taken Nitroglycerin? No  Schedule for 2/25 240pm ?

## 2020-11-20 NOTE — Telephone Encounter (Signed)
Spoke with Troy [name] and wife - had episode on Saturday of chest pain/tightness lasting for about 20-30 mins Troy [name] says he laid down and felt better - denies any chest pain/tightness since then - has had some dizziness lasting a few minutes at the time and has been feeling weak - denies SOB/swelling - wife is concerned with BP readings since Saturday   120/54 140/69 139/61 152/58 163/63 151/63  HR 60s-70s

## 2020-11-21 ENCOUNTER — Ambulatory Visit: Payer: Medicare Other | Admitting: Cardiology

## 2020-11-21 ENCOUNTER — Encounter: Payer: Self-pay | Admitting: Cardiology

## 2020-11-21 ENCOUNTER — Telehealth: Payer: Self-pay | Admitting: Cardiology

## 2020-11-21 ENCOUNTER — Encounter: Payer: Self-pay | Admitting: *Deleted

## 2020-11-21 ENCOUNTER — Other Ambulatory Visit: Payer: Self-pay

## 2020-11-21 VITALS — BP 142/80 | HR 62 | Ht 69.0 in | Wt 212.6 lb

## 2020-11-21 DIAGNOSIS — I2510 Atherosclerotic heart disease of native coronary artery without angina pectoris: Secondary | ICD-10-CM | POA: Diagnosis not present

## 2020-11-21 DIAGNOSIS — R079 Chest pain, unspecified: Secondary | ICD-10-CM | POA: Diagnosis not present

## 2020-11-21 MED ORDER — NITROGLYCERIN 0.4 MG SL SUBL: 0.4000 mg | SUBLINGUAL_TABLET | SUBLINGUAL | 3 refills | Status: AC | PRN

## 2020-11-21 MED ORDER — ISOSORBIDE MONONITRATE ER 60 MG PO TB24: 60.0000 mg | ORAL_TABLET | Freq: Every day | ORAL | 1 refills | Status: DC

## 2020-11-21 NOTE — Progress Notes (Signed)
Clinical Summary Troy [name] is a 80 y.o.male seen today for follow up of the following medical problems. This is a focused visit on history of CAD and recent chest pain.   1. CAD - hx of CABG in 1996 - cath 12/2014 showed patent LM, LAD occluded, LCX 70% mid, RCA 80% prox. LIMA-LAD patent SVG-OM occluded, SVG-ramus patent. LVEF 35-40%. Managed medically, with recs if symptoms on medical therapy can consider LCX intervetntion - 12/2014 echo LVEF 42-68%, grade I diastolic dysfunction   - admit 10/2017 with NTEMI, peak trop 0.3. Cath as reported below, receied DES to SVG-ramus - 01/2018 ech LVEF 34-19%, grade I diastolic dysfunction apical hypokinesis.  - SOB on brillinta, was changed to plavix. Plavix stopped 10/2018 after completed 1 year of therapy.    - recent chest pain episode chest pain - [place] rest. Moderate pain/pressure midchest. Not positional. Lasted 30 min. No other associated symptoms.  - no recurrence of symptoms.     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 Has pontoon boat, enjoys fishing.Spending his time currently at the beach, isolating himself from COVID-19 risks  Has had covid vaccine   Past Medical History:  Diagnosis Date  . CAD (coronary artery disease)    a. 1996 s/p CABG x 5 (LIMA->LAD, VG->OM->dLCX, VG->RI, VG->RPDA);  b.  Nuclear 2/11:  EF 34%,  large anterior and apical scar. There is mild peri-infarct ischemia;  c.  LHC 4/16:  LAD occld, LCx mid 70%, OM branch occl, RCA 80% prox, occl mid, L-LAD ok, S-OM/dCFX 100%, S-RI ok, S-RPDA ok, EF 35-40% >> med Rx d. 10/2017: patent LIMA-LAD and SVG-PDA with 99% stenosis of SVG-RI - s/p DES placement  . Diabetes mellitus   . GERD (gastroesophageal reflux disease)   . Hard of hearing    has hearing aids /BIL  . HLD (hyperlipidemia)   . Hypertension   . Ischemic cardiomyopathy    a. eval by EP in 2011 - no ICD needed at that time;  b.  Echo 4/16: inf and inf-lat HK, EF  50-55%, Gr 1 DD;  c. LHC 4/16: EF 35-40%; d. 01/2014 Cardiac MRI: EF 42%, 2/3rd thickness mid and distal ant/septal/apical scar.  . Myocardial infarction ([place]) 1996  . PAD (peripheral artery disease) ([facility])    Dr. Oneida Alar in the past  . Thrombocytopenia ([place])      Allergies  Allergen Reactions  . Lipitor [Atorvastatin Calcium]     Myalgias      Current Outpatient Medications  Medication Sig Dispense Refill  . amLODipine (NORVASC) 10 MG tablet TAKE 1 TABLET BY MOUTH  DAILY 90 tablet 3  . aspirin 81 MG EC tablet Take 81 mg by mouth 2 (two) times a day.    . carvedilol (COREG) 6.25 MG tablet TAKE 1 TABLET BY MOUTH  TWICE DAILY WITH A MEAL 180 tablet 3  . fish oil-omega-3 fatty acids 1000 MG capsule Take 1 g by mouth 2 (two) times daily.    Marland Kitchen glimepiride (AMARYL) 2 MG tablet Take 2 mg by mouth 2 (two) times daily.    . isosorbide mononitrate (IMDUR) 30 MG 24 hr tablet TAKE 1 TABLET BY MOUTH  DAILY 90 tablet 3  . JENTADUETO XR 01-999 MG TB24 Take 1 tablet by mouth daily.     Marland Kitchen losartan (COZAAR) 100 MG tablet Take 1 tablet (100 mg total) by mouth daily. 30 tablet 12  . rosuvastatin (CRESTOR) 10 MG tablet Take 1 tablet (  10 mg total) by mouth daily. 30 tablet 12  . zolpidem (AMBIEN) 10 MG tablet Take 1 tablet by mouth at bedtime.      No current facility-administered medications for this visit.     Past Surgical History:  Procedure Laterality Date  . CATARACT EXTRACTION W/PHACO Right 08/06/2013   Procedure: CATARACT EXTRACTION PHACO AND INTRAOCULAR LENS PLACEMENT RIGHT EYE;  Surgeon: Tonny Branch, MD;  Location: [facility];  Service: Ophthalmology;  Laterality: Right;  CDE:  15.48  . COLONOSCOPY    . COLONOSCOPY WITH PROPOFOL N/A 10/22/2013   Procedure: COLONOSCOPY WITH PROPOFOL;  Surgeon: Irene Shipper, MD;  Location: [facility];  Service: Endoscopy;  Laterality: N/A;  . CORONARY ARTERY BYPASS GRAFT  1996   5 vessels  . CORONARY STENT INTERVENTION N/A 10/31/2017   Procedure: CORONARY STENT  INTERVENTION;  Surgeon: Jettie Booze, MD;  Location: [facility];  Service: Cardiovascular;  Laterality: N/A;  . HERNIA REPAIR     groin-25 yrs ago  . LEFT HEART CATH AND CORS/GRAFTS ANGIOGRAPHY N/A 10/31/2017   Procedure: LEFT HEART CATH AND CORS/GRAFTS ANGIOGRAPHY;  Surgeon: Jettie Booze, MD;  Location: [facility];  Service: Cardiovascular;  Laterality: N/A;  . LEFT HEART CATHETERIZATION WITH CORONARY/GRAFT ANGIOGRAM N/A 01/06/2015   Procedure: LEFT HEART CATHETERIZATION WITH Beatrix Fetters;  Surgeon: Troy Sine, MD;  Location: [facility];  Service: Cardiovascular;  Laterality: N/A;     Allergies  Allergen Reactions  . Lipitor [Atorvastatin Calcium]     Myalgias       Family History  Problem Relation Age of Onset  . Heart disease Mother   . Heart attack Mother   . Hypertension Mother   . Heart disease Father   . Heart attack Father   . Hypertension Father   . Cancer Brother   . Heart attack Sister   . Heart attack Brother   . Diabetes Brother   . Diabetes Sister   . Hypertension Sister   . Hypertension Brother   . Stroke Neg Hx      Social History Troy [name] reports that he quit smoking about 30 years ago. His smoking use included cigarettes. He started smoking about 63 years ago. He has a 30.00 pack-year smoking history. He has never used smokeless tobacco. Troy [name] reports no history of alcohol use.   Review of Systems CONSTITUTIONAL: No weight loss, fever, chills, weakness or fatigue.  HEENT: Eyes: No visual loss, blurred vision, double vision or yellow sclerae.No hearing loss, sneezing, congestion, runny nose or sore throat.  SKIN: No rash or itching.  CARDIOVASCULAR: per hpi RESPIRATORY: No shortness of breath, cough or sputum.  GASTROINTESTINAL: No anorexia, nausea, vomiting or diarrhea. No abdominal pain or blood.  GENITOURINARY: No burning on urination, no polyuria NEUROLOGICAL: No headache, dizziness, syncope,  paralysis, ataxia, numbness or tingling in the extremities. No change in bowel or bladder control.  MUSCULOSKELETAL: No muscle, back pain, joint pain or stiffness.  LYMPHATICS: No enlarged nodes. No history of splenectomy.  PSYCHIATRIC: No history of depression or anxiety.  ENDOCRINOLOGIC: No reports of sweating, cold or heat intolerance. No polyuria or polydipsia.  Marland Kitchen   Physical Examination Today's Vitals   11/21/20 1443  BP: (!) 142/80  Pulse: 62  SpO2: 97%  Weight: 212 lb 9.6 oz (96.4 kg)  Height: 5\' 9"  (1.753 m)   Body mass index is 31.4 kg/m.  Gen: resting comfortably, no acute distress HEENT: no scleral icterus, pupils equal round  and reactive, no palptable cervical adenopathy,  CV: RRR, no m/r/g no jvd Resp: Clear to auscultation bilaterally GI: abdomen is soft, non-tender, non-distended, normal bowel sounds, no hepatosplenomegaly MSK: extremities are warm, no edema.  Skin: warm, no rash Neuro:  no focal deficits Psych: appropriate affect   Diagnostic Studies  12/2014 Cath  HEMODYNAMICS:  Central Aorta: 170/59  Left Ventricle: 170/19  ANGIOGRAPHY:  Left main: Normal vessel which bifurcated into the LAD and left circumflex coronary artery  LAD: Moderate size proximal vessel that was totally occluded after the first septal and diagonal vessel.  Left circumflex: good size vessel that had 70% mid stenosis before a small marginal branch. The distal vessel was small caliber and there was retrograde filling of a previous sequential graft most likely between the small marginal and distal circumflex.  Right coronary artery: Moderate size vessel with calcification and 80% proximal stenosis with a small area of probable thrombus in its midsegment prior to total mid occlusion.  LIMA to LAD: Widely patent and anastomosed into the mid LAD. There is retrograde filling to the proximal occlusion. The anastomosis was free of significant disease extended to the  apex.  SVG sequential graft supplying a marginal and distal circumflex was totally occluded at its origin.  SVG E high marginal or ramus intermediate vessel was widely patent. There was mild 20% proximal smooth narrowing.   Left ventriculography revealed global LV dysfunction with an ejection fraction of 35 to less than 40%.   IMPRESSION:  Ischemic cardiomyopathy with an ejection fraction of 35 to less than 40% with diffuse global hypocontractility.  Significant native coronary obstructive disease with total occlusion of the proximal LAD after the septal diagonal vessel; 70% stenosis in the proximal AV groove circumflex with occlusion of the native OM branch and retrograde filling of the sequential limb distally to this mid branch and total occlusion of the mid right coronary artery with 80% calcified proximal stenosis and small area of thrombus in the mid RCA prior to the total occlusion.  Occluded vein graft which most likely was the sequential graft supplying an OM and distal circumflex vessel the circumflex coronary artery.  Patent SVG which seems to supply a high marginal or ramus intermediate-like vessel this 20% proximal narrowing in the graft.  Patent SVG to the PDA branch of the RCA.  Patent LIMA graft to the mid LAD.  POST CATHRECOMMENDATION:  The patient has an occluded graft which most likely sequentially supplied a small OM and distal circumflex vessel. 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 Platelets today are low at 81,000. He has not been on any significant antianginal regimen. Initial medical therapy will be recommended with close follow-up of his platelets. If recurrent symptomatology develops consider possible PCI to the native circumflex   10/2017 cath  Ost Cx to Mid Cx lesion is 75% stenosed and appears unchanged from prior. THe SVG to circ is occluded  Prox LAD lesion is 100% stenosed. LIMA to LAD is patent.  Mid RCA lesion is  100% stenosed. SVG to PDA is patent.  Ramus-1 lesion is 100% stenosed. SVG to ramus is patent proximally. Dist Graft lesion is 99% stenosed.  A drug-eluting stent was successfully placed using a STENT SYNERGY DES 3X24, after aspiration thrombectomy.  Post intervention, there is a 0% residual stenosis.  Ramus-2 lesion is 80% stenosed. Balloon angioplasty was performed using a BALLOON SAPPHIRE 2.0X15.  Post intervention, there is a 10% residual stenosis.  LV end diastolic pressure is mildly  elevated.  There is no aortic valve stenosis.  Continue IV angiomax for 1 hour post procedure.  Continue aspirin and Brilinta for 1 year along with aggressive secondary prevention.   01/2018 echo Study Conclusions  - Left ventricle: The cavity size was mildly dilated. Wall thickness was normal. Systolic function was mildly to moderately reduced. The estimated ejection fraction was in the range of 40% to 45%. Doppler parameters are consistent with abnormal left ventricular relaxation (grade 1 diastolic dysfunction). Doppler parameters are consistent with high ventricular filling pressure. - Regional wall motion abnormality: Hypokinesis of the apical anterior, apical inferior, apical septal, and apical myocardium. - Aortic valve: Moderately calcified annulus. Trileaflet. - Mitral valve: Calcified annulus.  Recommendations: The LV apex was suboptimally visualized. Consider contrast enhancement in the future.   Jan 2022 echo IMPRESSIONS    1. The apex is akinetic. There is no visualized apical thrombus. . Left  ventricular ejection fraction, by estimation, is 35 to 40%. The left  ventricle has moderately decreased function.  2. Limited study to evaluate for possible apical thrombus   Jan 2022 echo IMPRESSIONS    1. Left ventricular ejection fraction, by estimation, is 40 to 45%. The  left ventricle has mildly decreased function. The left ventricle  demonstrates  regional wall motion abnormalities (see scoring  diagram/findings for description). There is mild left  ventricular hypertrophy. Left ventricular diastolic parameters are  indeterminate. There is acoustic shadowing at the apex, suggest limited  Definity contrast study to exclude mural thrombus.  2. Right ventricular systolic function is normal. The right ventricular  size is normal. Tricuspid regurgitation signal is inadequate for assessing  PA pressure.  3. The mitral valve is grossly normal. Trivial mitral valve  regurgitation. Moderate mitral annular calcification.  4. The aortic valve is tricuspid. There is moderate calcification of the  aortic valve. Aortic valve regurgitation is not visualized. Mild to  moderate aortic valve sclerosis/calcification is present, without any  evidence of aortic stenosis.  5. Aortic dilatation noted. There is mild dilatation of the ascending  aorta, measuring 40 mm.  6. The inferior vena cava is normal in size with greater than 50%  respiratory variability, suggesting right atrial pressure of 3 mmHg.   Comparison(s): Previous Echo showed LV EF 40-45%, mild to moderate reduced  LV systolic function, Grade I diastolic dysfunction, Hypokinesis of the  apical anterior, apical inferior, apical septal, and apical myocardium.     Assessment and Plan   1. CAD -isolated episode of chest pain last week, concerning for possible angina - will plan for lexiscan to further assess and risk stratify - increase imdur to 60mg  daily, give SL NG prn - since assessing for risk stratification ok to be on imdur for stress test.  EKG today shows SR, chronic ST/T changes     [name], M.D.

## 2020-11-21 NOTE — Telephone Encounter (Signed)
Pt has appt today with Dr Harl Bowie

## 2020-11-21 NOTE — Patient Instructions (Signed)
Your physician recommends that you schedule a follow-up appointment in: [place] has recommended you make the following change in your medication:   INCREASE IMDUR 60 MG DAILY  TAKE NITROGLYCERIN 0.4 MG UNDER THE TONGUE EVERY 5 MINS FOR CHEST PAIN   Your physician has requested that you have a lexiscan myoview. For further information please visit [email or web address]. Please follow instruction sheet, as given.  Nitroglycerin sublingual tablets What is this medicine? NITROGLYCERIN (nye troe GLI ser in) is a type of vasodilator. It relaxes blood vessels, increasing the blood and oxygen supply to your heart. This medicine is used to relieve chest pain caused by angina. It is also used to prevent chest pain before activities like climbing stairs, going outdoors in cold weather, or sexual activity. This medicine may be used for other purposes; ask your health care provider or pharmacist if you have questions. COMMON BRAND NAME(S): Nitroquick, Nitrostat, Nitrotab What should I tell my health care provider before I take this medicine? They need to know if you have any of these conditions:  anemia  head injury, recent stroke, or bleeding in the brain  liver disease  previous heart attack  an unusual or allergic reaction to nitroglycerin, other medicines, foods, dyes, or preservatives  pregnant or trying to get pregnant  breast-feeding How should I use this medicine? Take this medicine by mouth as needed. Use at the first sign of an angina attack (chest pain or tightness). You can also take this medicine 5 to 10 minutes before an event likely to produce chest pain. Follow the directions exactly as written on the prescription label. Place one tablet under your tongue and let it dissolve. Do not swallow whole. Replace the dose if you accidentally swallow it. It will help if your mouth is not dry. Saliva around the tablet will help it to dissolve more quickly. Do  not eat or drink, smoke or chew tobacco while a tablet is dissolving. Sit down when taking this medicine. In an angina attack, you should feel better within 5 minutes after your first dose. You can take a dose every 5 minutes up to a total of 3 doses. If you do not feel better or feel worse after 1 dose, call 9-1-1 at once. Do not take more than 3 doses in 15 minutes. Your health care provider might give you other directions. Follow those directions if he or she does. Do not take your medicine more often than directed. Talk to your health care provider about the use of this medicine in children. Special care may be needed. Overdosage: If you think you have taken too much of this medicine contact a poison control center or emergency room at once. NOTE: This medicine is only for you. Do not share this medicine with others. What if I miss a dose? This does not apply. This medicine is only used as needed. What may interact with this medicine? Do not take this medicine with any of the following medications:  certain migraine medicines like ergotamine and dihydroergotamine (DHE)  medicines used to treat erectile dysfunction like sildenafil, tadalafil, and vardenafil  riociguat This medicine may also interact with the following medications:  alteplase  aspirin  heparin  medicines for high blood pressure  medicines for mental depression  other medicines used to treat angina  phenothiazines like chlorpromazine, mesoridazine, prochlorperazine, thioridazine This list may not describe all possible interactions. Give your health care provider a list of all the medicines, herbs, non-prescription  drugs, or dietary supplements you use. Also tell them if you smoke, drink alcohol, or use illegal drugs. Some items may interact with your medicine. What should I watch for while using this medicine? Tell your doctor or health care professional if you feel your medicine is no longer working. Keep this  medicine with you at all times. Sit or lie down when you take your medicine to prevent falling if you feel dizzy or faint after using it. Try to remain calm. This will help you to feel better faster. If you feel dizzy, take several deep breaths and lie down with your feet propped up, or bend forward with your head resting between your knees. You may get drowsy or dizzy. Do not drive, use machinery, or do anything that needs mental alertness until you know how this drug affects you. Do not stand or sit up quickly, especially if you are an older patient. This reduces the risk of dizzy or fainting spells. Alcohol can make you more drowsy and dizzy. Avoid alcoholic drinks. Do not treat yourself for coughs, colds, or pain while you are taking this medicine without asking your doctor or health care professional for advice. Some ingredients may increase your blood pressure. What side effects may I notice from receiving this medicine? Side effects that you should report to your doctor or health care professional as soon as possible:  allergic reactions (skin rash, itching or hives; swelling of the face, lips, or tongue)  low blood pressure (dizziness; feeling faint or lightheaded, falls; unusually weak or tired)  low red blood cell counts (trouble breathing; feeling faint; lightheaded, falls; unusually weak or tired) Side effects that usually do not require medical attention (report to your doctor or health care professional if they continue or are bothersome):  facial flushing (redness)  headache  nausea, vomiting This list may not describe all possible side effects. Call your doctor for medical advice about side effects. You may report side effects to FDA at 1-800-FDA-1088. Where should I keep my medicine? Keep out of the reach of children. Store at room temperature between 20 and 25 degrees C (68 and 77 degrees F). Store in [employer]. Protect from light and moisture. Keep tightly closed.  Throw away any unused medicine after the expiration date. NOTE: This sheet is a summary. It may not cover all possible information. If you have questions about this medicine, talk to your doctor, pharmacist, or health care provider.  2021 Elsevier/Gold Standard (2018-06-14 16:46:32)

## 2020-11-21 NOTE — Telephone Encounter (Signed)
Pre-cert Verification for the following procedure     LEXISCAN   DATE:  12/02/2020  LOCATION:[facility]

## 2020-12-02 ENCOUNTER — Ambulatory Visit (HOSPITAL_COMMUNITY)
Admission: RE | Admit: 2020-12-02 | Discharge: 2020-12-02 | Disposition: A | Discharge status: Home / Self Care | Payer: Medicare Other | Source: Ambulatory Visit | Attending: Cardiology | Admitting: Cardiology

## 2020-12-02 ENCOUNTER — Encounter (HOSPITAL_COMMUNITY)
Admission: RE | Admit: 2020-12-02 | Discharge: 2020-12-02 | Disposition: A | Discharge status: Home / Self Care | Payer: Medicare Other | Source: Ambulatory Visit | Attending: Cardiology | Admitting: Cardiology

## 2020-12-02 ENCOUNTER — Encounter (HOSPITAL_COMMUNITY): Payer: Self-pay

## 2020-12-02 DIAGNOSIS — R079 Chest pain, unspecified: Secondary | ICD-10-CM | POA: Insufficient documentation

## 2020-12-02 HISTORY — DX: Heart failure, unspecified: I50.9

## 2020-12-02 LAB — NM MYOCAR MULTI W/SPECT W/WALL MOTION / EF
LV dias vol: 232 mL (ref 62–150)
LV sys vol: 167 mL
Peak HR: 75 {beats}/min
RATE: 0.39
Rest HR: 55 {beats}/min
SDS: 1
SRS: 19
SSS: 20
TID: 0.98

## 2020-12-02 MED ORDER — REGADENOSON 0.4 MG/5ML IV SOLN
INTRAVENOUS | Status: AC
  Administered 2020-12-02: 0.4 mg via INTRAVENOUS
  Filled 2020-12-02: qty 5

## 2020-12-02 MED ORDER — SODIUM CHLORIDE FLUSH 0.9 % IV SOLN
INTRAVENOUS | Status: AC
  Administered 2020-12-02: 10 mL via INTRAVENOUS
  Filled 2020-12-02: qty 10

## 2020-12-02 MED ORDER — TECHNETIUM TC 99M TETROFOSMIN IV KIT
30.0000 | PACK | Freq: Once | INTRAVENOUS | Status: AC | PRN
  Administered 2020-12-02: 31 via INTRAVENOUS

## 2020-12-02 MED ORDER — TECHNETIUM TC 99M TETROFOSMIN IV KIT
10.0000 | PACK | Freq: Once | INTRAVENOUS | Status: AC | PRN
  Administered 2020-12-02: 10.8 via INTRAVENOUS

## 2020-12-05 ENCOUNTER — Telehealth: Payer: Self-pay

## 2020-12-05 NOTE — Telephone Encounter (Signed)
-----   Message from [name], MD sent at 12/05/2020 11:35 AM EST ----- Stress test looks ok. Only shows some signs of old blockages, no eviedence of new blockages  Zandra Abts MD

## 2020-12-05 NOTE — Telephone Encounter (Signed)
Patient contacted and verbalized understanding of result.

## 2021-03-26 ENCOUNTER — Ambulatory Visit: Payer: Medicare Other | Admitting: Cardiology

## 2021-03-26 ENCOUNTER — Encounter: Payer: Self-pay | Admitting: Cardiology

## 2021-03-26 VITALS — BP 124/58 | HR 61 | Ht 69.0 in | Wt 231.4 lb

## 2021-03-26 DIAGNOSIS — I25118 Atherosclerotic heart disease of native coronary artery with other forms of angina pectoris: Secondary | ICD-10-CM

## 2021-03-26 MED ORDER — ISOSORBIDE MONONITRATE ER 60 MG PO TB24: ORAL_TABLET | ORAL | Status: AC

## 2021-03-26 NOTE — Progress Notes (Signed)
Clinical Summary Mr. Dommer is a 80 y.o.male seen today for follow up of the following medical problems. This is a focused visit on history of CAD and recent chest pain.    1. CAD with chronic stable angina - hx of CABG in 1996 - cath 12/2014 showed patent LM, LAD occluded, LCX 70% mid, RCA 80% prox. LIMA-LAD patent SVG-OM occluded, SVG-ramus patent. LVEF 35-40%. Managed medically, with recs if symptoms on medical therapy can consider LCX intervetntion - 12/2014 echo LVEF 01-60%, grade I diastolic dysfunction      - admit 10/2017 with NTEMI, peak trop 0.3. Cath as reported below, receied DES to SVG-ramus  - 01/2018 ech LVEF 10-93%, grade I diastolic dysfunction apical hypokinesis.    - SOB on brillinta, was changed to plavix. Plavix stopped 10/2018 after completed 1 year of therapy.        - recent chest pain episode  - [place]. Moderate pain/pressure midchest. Not positional. Lasted 30 min. No other associated symptoms.  - no recurrence of symptoms.     - 11/2020 nuclear stress: scar, no current ischemia. LVEF <30% by nuclear imaging - Jan 2022 echo LVEF 35-40% - some chest pains at times with high levels of exertion. 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 Has pontoon boat, enjoys fishing. Spending his time currently at the beach, isolating himself from COVID-19 risks  Past Medical History:  Diagnosis Date   CAD (coronary artery disease)    a. 1996 s/p CABG x 5 (LIMA->LAD, VG->OM->dLCX, VG->RI, VG->RPDA);  b.  Nuclear 2/11:  EF 34%,  large anterior and apical scar. There is mild peri-infarct ischemia;  c.  [place] 4/16:  LAD occld, LCx mid 70%, OM Tyler Cubit occl, RCA 80% prox, occl mid, L-LAD ok, S-OM/dCFX 100%, S-RI ok, S-RPDA ok, EF 35-40% >> med Rx d. 10/2017: patent LIMA-LAD and SVG-PDA with 99% stenosis of SVG-RI - s/p DES placement   CHF (congestive  heart failure) (HCC)    Diabetes mellitus    GERD (gastroesophageal reflux disease)    Hard of hearing    has hearing aids /BIL   HLD (hyperlipidemia)    Hypertension    Ischemic cardiomyopathy    a. eval by EP in 2011 - no ICD needed at that time;  b.  Echo 4/16: inf and inf-lat HK, EF 50-55%, Gr 1 DD;  c. LHC 4/16: EF 35-40%; d. 01/2014 Cardiac MRI: EF 42%, 2/3rd thickness mid and distal ant/septal/apical scar.   Myocardial infarction ([place]) 1996   PAD (peripheral artery disease) ([place])    Dr. Oneida Alar in the past   Thrombocytopenia (HCC)      Allergies  Allergen Reactions   Lipitor [Atorvastatin Calcium]     Myalgias    Benazepril Rash     Current Outpatient Medications  Medication Sig Dispense Refill   amLODipine (NORVASC) 10 MG tablet TAKE 1 TABLET BY MOUTH  DAILY 90 tablet 3   aspirin 81 MG EC tablet Take 81 mg by mouth 2 (two) times a day.     carvedilol (COREG) 6.25 MG tablet TAKE 1 TABLET BY MOUTH  TWICE DAILY WITH A MEAL 180 tablet 3   fish oil-omega-3 fatty acids 1000 MG capsule Take 1 g by mouth 2 (two) times daily.     glimepiride (AMARYL) 2 MG tablet  Take 2 mg by mouth 2 (two) times daily.     isosorbide mononitrate (IMDUR) 60 MG 24 hr tablet Take 1 tablet (60 mg total) by mouth daily. 90 tablet 1   JENTADUETO XR 01-999 MG TB24 Take 1 tablet by mouth daily.      losartan (COZAAR) 100 MG tablet Take 1 tablet (100 mg total) by mouth daily. 30 tablet 12   nitroGLYCERIN (NITROSTAT) 0.4 MG SL tablet Place 1 tablet (0.4 mg total) under the tongue every 5 (five) minutes as needed for chest pain. 25 tablet 3   rosuvastatin (CRESTOR) 10 MG tablet Take 1 tablet (10 mg total) by mouth daily. 30 tablet 12   zolpidem (AMBIEN) 10 MG tablet Take 1 tablet by mouth at bedtime.      No current facility-administered medications for this visit.     Past Surgical History:  Procedure Laterality Date   CATARACT EXTRACTION W/PHACO Right 08/06/2013   Procedure: CATARACT EXTRACTION PHACO  AND INTRAOCULAR LENS PLACEMENT RIGHT EYE;  Surgeon: Tonny Binh Doten, MD;  Location: [facility];  Service: Ophthalmology;  Laterality: Right;  CDE:  15.48   COLONOSCOPY     COLONOSCOPY WITH PROPOFOL N/A 10/22/2013   Procedure: COLONOSCOPY WITH PROPOFOL;  Surgeon: Irene Shipper, MD;  Location: [facility];  Service: Endoscopy;  Laterality: N/A;   CORONARY ARTERY BYPASS GRAFT  1996   5 vessels   CORONARY STENT INTERVENTION N/A 10/31/2017   Procedure: CORONARY STENT INTERVENTION;  Surgeon: Jettie Booze, MD;  Location: [facility];  Service: Cardiovascular;  Laterality: N/A;   HERNIA REPAIR     groin-25 yrs ago   LEFT HEART CATH AND CORS/GRAFTS ANGIOGRAPHY N/A 10/31/2017   Procedure: LEFT HEART CATH AND CORS/GRAFTS ANGIOGRAPHY;  Surgeon: Jettie Booze, MD;  Location: [facility];  Service: Cardiovascular;  Laterality: N/A;   LEFT HEART CATHETERIZATION WITH CORONARY/GRAFT ANGIOGRAM N/A 01/06/2015   Procedure: LEFT HEART CATHETERIZATION WITH Beatrix Fetters;  Surgeon: Troy Sine, MD;  Location: [facility];  Service: Cardiovascular;  Laterality: N/A;     Allergies  Allergen Reactions   Lipitor [Atorvastatin Calcium]     Myalgias    Benazepril Rash      Family History  Problem Relation Age of Onset   Heart disease Mother    Heart attack Mother    Hypertension Mother    Heart disease Father    Heart attack Father    Hypertension Father    Cancer Brother    Heart attack Sister    Heart attack Brother    Diabetes Brother    Diabetes Sister    Hypertension Sister    Hypertension Brother    Stroke Neg Hx      Social History Mr. Arwood reports that he quit smoking about 30 years ago. His smoking use included cigarettes. He started smoking about 63 years ago. He has a 30.00 pack-year smoking history. He has never used smokeless tobacco. Mr. Bluitt reports no history of alcohol use.   Review of Systems CONSTITUTIONAL: No weight loss, fever, chills,  weakness or fatigue.  HEENT: Eyes: No visual loss, blurred vision, double vision or yellow sclerae.No hearing loss, sneezing, congestion, runny nose or sore throat.  SKIN: No rash or itching.  CARDIOVASCULAR: per hpi RESPIRATORY: No shortness of breath, cough or sputum.  GASTROINTESTINAL: No anorexia, nausea, vomiting or diarrhea. No abdominal pain or blood.  GENITOURINARY: No burning on urination, no polyuria NEUROLOGICAL: No headache, dizziness, syncope, paralysis, ataxia, numbness  or tingling in the extremities. No change in bowel or bladder control.  MUSCULOSKELETAL: No muscle, back pain, joint pain or stiffness.  LYMPHATICS: No enlarged nodes. No history of splenectomy.  PSYCHIATRIC: No history of depression or anxiety.  ENDOCRINOLOGIC: No reports of sweating, cold or heat intolerance. No polyuria or polydipsia.  Marland Kitchen   Physical Examination Today's Vitals   03/26/21 1347  BP: (!) 124/58  Pulse: 61  SpO2: 96%  Weight: 231 lb 6.4 oz (105 kg)  Height: 5\' 9"  (1.753 m)   Body mass index is 34.17 kg/m.  Gen: resting comfortably, no acute distress HEENT: no scleral icterus, pupils equal round and reactive, no palptable cervical adenopathy,  CV: RRR, no m/r/g, no jvd Resp: Clear to auscultation bilaterally GI: abdomen is soft, non-tender, non-distended, normal bowel sounds, no hepatosplenomegaly MSK: extremities are warm, no edema.  Skin: warm, no rash Neuro:  no focal deficits Psych: appropriate affect   Diagnostic Studies 12/2014 Cath   HEMODYNAMICS:    Central Aorta: 170/59    Left Ventricle: 170/19   ANGIOGRAPHY:   Left main: Normal vessel which bifurcated into the LAD and left circumflex coronary artery   LAD: Moderate size proximal vessel that was totally occluded after the first septal and diagonal vessel.   Left circumflex: good size vessel that had 70% mid stenosis before a small marginal Viviana Trimble. The distal vessel was small caliber and there was retrograde  filling of a previous sequential graft most likely between the small marginal and distal circumflex.   Right coronary artery: Moderate size vessel with calcification and 80% proximal stenosis with a small area of probable thrombus in its midsegment prior to total mid occlusion.   LIMA to LAD: Widely patent and anastomosed into the mid LAD. There is retrograde filling to the proximal occlusion. The anastomosis was free of significant disease extended to the apex.   SVG  sequential graft supplying a marginal and distal circumflex was totally occluded at its origin.   SVG E high marginal or ramus intermediate vessel was widely patent. There was mild 20% proximal smooth narrowing.     Left ventriculography revealed global  LV dysfunction with an ejection fraction of 35 to less than 40%.     IMPRESSION:   Ischemic cardiomyopathy with an ejection fraction of 35 to less than 40% with diffuse global hypocontractility.   Significant native coronary obstructive disease with total occlusion of the proximal LAD after the septal diagonal vessel; 70% stenosis in the proximal AV groove circumflex with occlusion of the native OM Laval Cafaro and retrograde filling of the sequential limb distally to this mid Meenakshi Sazama and total occlusion of the mid right coronary artery with 80% calcified proximal stenosis and small area of thrombus in the mid RCA prior to the total occlusion.   Occluded vein graft which most likely was the sequential graft supplying an OM and distal circumflex vessel the circumflex coronary artery.   Patent SVG which seems to supply a high marginal or ramus intermediate-like vessel this 20% proximal narrowing in the graft.   Patent SVG to the PDA Daysha Ashmore of the RCA.   Patent LIMA graft to the mid LAD.   POST CATH RECOMMENDATION:   The patient has an occluded graft which most likely sequentially supplied a small OM and distal circumflex vessel. 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  Platelets today are low at 81,000. He has not been on any significant antianginal regimen. Initial medical therapy will be recommended with close  follow-up of his platelets. If recurrent symptomatology develops consider possible PCI to the native circumflex      10/2017 cath Ost Cx to Mid Cx lesion is 75% stenosed and appears unchanged from prior. THe SVG to circ is occluded Prox LAD lesion is 100% stenosed. LIMA to LAD is patent. Mid RCA lesion is 100% stenosed. SVG to PDA is patent. Ramus-1 lesion is 100% stenosed. SVG to ramus is patent proximally. Dist Graft lesion is 99% stenosed. A drug-eluting stent was successfully placed using a STENT SYNERGY DES 3X24, after aspiration thrombectomy. Post intervention, there is a 0% residual stenosis. Ramus-2 lesion is 80% stenosed. Balloon angioplasty was performed using a BALLOON SAPPHIRE 2.0X15. Post intervention, there is a 10% residual stenosis. LV end diastolic pressure is mildly elevated. There is no aortic valve stenosis.   Continue IV angiomax for 1 hour post procedure.   Continue aspirin and Brilinta for 1 year along with aggressive secondary prevention.     01/2018 echo Study Conclusions   - Left ventricle: The cavity size was mildly dilated. Wall   thickness was normal. Systolic function was mildly to moderately   reduced. The estimated ejection fraction was in the range of 40%   to 45%. Doppler parameters are consistent with abnormal left   ventricular relaxation (grade 1 diastolic dysfunction). Doppler   parameters are consistent with high ventricular filling pressure. - Regional wall motion abnormality: Hypokinesis of the apical   anterior, apical inferior, apical septal, and apical myocardium. - Aortic valve: Moderately calcified annulus. Trileaflet. - Mitral valve: Calcified annulus.   Recommendations:  The LV apex was suboptimally visualized. Consider contrast enhancement in the future.     Jan 2022 echo IMPRESSIONS      1. The apex is akinetic. There is no visualized apical thrombus. . Left  ventricular ejection fraction, by estimation, is 35 to 40%. The left  ventricle has moderately decreased function.   2. Limited study to evaluate for possible apical thrombus    Jan 2022 echo IMPRESSIONS     1. Left ventricular ejection fraction, by estimation, is 40 to 45%. The  left ventricle has mildly decreased function. The left ventricle  demonstrates regional wall motion abnormalities (see scoring  diagram/findings for description). There is mild left  ventricular hypertrophy. Left ventricular diastolic parameters are  indeterminate. There is acoustic shadowing at the apex, suggest limited  Definity contrast study to exclude mural thrombus.   2. Right ventricular systolic function is normal. The right ventricular  size is normal. Tricuspid regurgitation signal is inadequate for assessing  PA pressure.   3. The mitral valve is grossly normal. Trivial mitral valve  regurgitation. Moderate mitral annular calcification.   4. The aortic valve is tricuspid. There is moderate calcification of the  aortic valve. Aortic valve regurgitation is not visualized. Mild to  moderate aortic valve sclerosis/calcification is present, without any  evidence of aortic stenosis.   5. Aortic dilatation noted. There is mild dilatation of the ascending  aorta, measuring 40 mm.   6. The inferior vena cava is normal in size with greater than 50%  respiratory variability, suggesting right atrial pressure of 3 mmHg.   Comparison(s): Previous Echo showed LV EF 40-45%, mild to moderate reduced  LV systolic function, Grade I diastolic dysfunction, Hypokinesis of the  apical anterior, apical inferior, apical septal, and apical myocardium.    11/2020 nuclear stress There was no ST segment deviation noted during stress. Findings consistent with prior large apical myocardial infarction. Prior small  inferior infarction This is a  high risk study. Risk based on decreased LVEF, there is no current ischemia. The left ventricular ejection fraction is severely decreased (<30%).    Assessment and Plan  1. CAD with chronic stable angina - recent stress test with just scar, no current ischemia - chronic stable anginal symptoms with higher levels of exertion - increase imdur, due to headache try taking 30mg  in AM and 60mg  in pm. On max norvasc, HRs would not tolerate higher beta blocker dosing. Would need to consider ranexa as next option.  - if progression of symptoms would consider cath, as of not chronic stable angina with recent stress without active ischemia, ongoing medical therapy is appropriate.     [name], M.D

## 2021-03-26 NOTE — Patient Instructions (Addendum)
Medication Instructions:  Increase Imdur to 60mg  - 1/2 tab every morning & 1 tab every evening.  Continue all other medications.    Labwork: none  Testing/Procedures: none  Follow-Up: 2 months   Any Other Special Instructions Will Be Listed Below (If Applicable). Please call the office on Tuesday with an update on chest pain.   If you need a refill on your cardiac medications before your next appointment, please call your pharmacy.

## 2021-03-31 ENCOUNTER — Telehealth: Payer: Self-pay | Admitting: Cardiology

## 2021-03-31 NOTE — Telephone Encounter (Signed)
Patient called stating that since the increase of  Isosorbide 60 mg he is doing much better.

## 2021-03-31 NOTE — Telephone Encounter (Signed)
Will forward to provider for [facility]

## 2021-04-01 NOTE — Telephone Encounter (Signed)
To clarify he was to take 30mg  in AM and 60mg  in PM. Pleas verify has been taking this way and if any issues with headache  Zandra Abts MD

## 2021-04-02 NOTE — Telephone Encounter (Signed)
Left message for pt to give [place] a call back regarding medication management.

## 2021-04-07 NOTE — Telephone Encounter (Signed)
Pt verified that he is taking 30 mg in the am and 60 mg in the pm. Pt stated that he has had a headache every now and then but nothing that bothers him.

## 2021-04-27 DEATH — deceased

## 2021-05-18 ENCOUNTER — Ambulatory Visit: Payer: Medicare Other | Admitting: Family Medicine
